# Patient Record
Sex: Male | Born: 1945
Health system: Southern US, Community
[De-identification: ages and names within clinical notes are randomized; demographics above are authoritative.]

## PROBLEM LIST (undated history)

## (undated) DIAGNOSIS — K219 Gastro-esophageal reflux disease without esophagitis: Secondary | ICD-10-CM

## (undated) DIAGNOSIS — M810 Age-related osteoporosis without current pathological fracture: Secondary | ICD-10-CM

## (undated) DIAGNOSIS — C4441 Basal cell carcinoma of skin of scalp and neck: Secondary | ICD-10-CM

## (undated) DIAGNOSIS — J9819 Other pulmonary collapse: Secondary | ICD-10-CM

## (undated) DIAGNOSIS — J45909 Unspecified asthma, uncomplicated: Secondary | ICD-10-CM

## (undated) DIAGNOSIS — Z8489 Family history of other specified conditions: Secondary | ICD-10-CM

## (undated) HISTORY — PX: LEG SURGERY: SHX1003

## (undated) HISTORY — DX: Age-related osteoporosis without current pathological fracture: M81.0

## (undated) HISTORY — DX: Basal cell carcinoma of skin of scalp and neck: C44.41

## (undated) HISTORY — PX: OTHER SURGICAL HISTORY: SHX169

## (undated) HISTORY — PX: HERNIA REPAIR: SHX51

---

## 2005-03-07 HISTORY — PX: FRACTURE SURGERY: SHX138

## 2015-08-06 DIAGNOSIS — C4441 Basal cell carcinoma of skin of scalp and neck: Secondary | ICD-10-CM

## 2015-08-06 HISTORY — DX: Basal cell carcinoma of skin of scalp and neck: C44.41

## 2016-07-04 ENCOUNTER — Ambulatory Visit (INDEPENDENT_AMBULATORY_CARE_PROVIDER_SITE_OTHER): Payer: PPO | Admitting: Family Medicine

## 2016-07-04 VITALS — BP 138/72 | HR 51 | Temp 97.6°F | Ht 66.0 in | Wt 142.0 lb

## 2016-07-04 DIAGNOSIS — Z125 Encounter for screening for malignant neoplasm of prostate: Secondary | ICD-10-CM | POA: Diagnosis not present

## 2016-07-04 DIAGNOSIS — Z0000 Encounter for general adult medical examination without abnormal findings: Secondary | ICD-10-CM

## 2016-07-04 DIAGNOSIS — Z1329 Encounter for screening for other suspected endocrine disorder: Secondary | ICD-10-CM

## 2016-07-04 DIAGNOSIS — N401 Enlarged prostate with lower urinary tract symptoms: Secondary | ICD-10-CM | POA: Diagnosis not present

## 2016-07-04 DIAGNOSIS — N400 Enlarged prostate without lower urinary tract symptoms: Secondary | ICD-10-CM | POA: Insufficient documentation

## 2016-07-04 DIAGNOSIS — Z1159 Encounter for screening for other viral diseases: Secondary | ICD-10-CM | POA: Diagnosis not present

## 2016-07-04 DIAGNOSIS — Z13220 Encounter for screening for lipoid disorders: Secondary | ICD-10-CM | POA: Diagnosis not present

## 2016-07-04 MED ORDER — FINASTERIDE 5 MG PO TABS: 5.0000 mg | ORAL_TABLET | Freq: Every day | ORAL | 4 refills | Status: DC

## 2016-07-04 NOTE — Progress Notes (Signed)
BP 138/72 (BP Location: Left Arm)   Pulse (!) 51   Temp 97.6 F (36.4 C)   Ht 5\' 6"  (1.676 m)   Wt 142 lb (64.4 kg)   SpO2 99%   BMI 22.92 kg/m    Subjective:    Patient ID: Jose [name], male    DOB: 1946/03/02, 71 y.o.   MRN: [id number]  HPI: Jose [name] is a 71 y.o. male  Chief Complaint  Patient presents with  . Establish Care    Patient believes he is UTD on vaccines, but unsure of dates. Had them done at [place].   Patient's establish as new patient taking Proscar for BPH otherwise doing well takes multiple vitamin aspirin and stool softeners. Patient's episodic great deal and has skin lesions and multiple and multiple surgeries and has appointment with dermatology later this year at the [place].  Relevant past medical, surgical, family and social history reviewed and updated as indicated. Interim medical history since our last visit reviewed. Allergies and medications reviewed and updated.  Review of Systems  Constitutional: Negative.   HENT: Negative.   Eyes: Negative.   Respiratory: Negative.   Cardiovascular: Negative.   Gastrointestinal: Negative.   Endocrine: Negative.   Genitourinary: Negative.   Musculoskeletal: Negative.   Skin: Negative.   Allergic/Immunologic: Negative.   Neurological: Negative.   Hematological: Negative.   Psychiatric/Behavioral: Negative.     Per HPI unless specifically indicated above     Objective:    BP 138/72 (BP Location: Left Arm)   Pulse (!) 51   Temp 97.6 F (36.4 C)   Ht 5\' 6"  (1.676 m)   Wt 142 lb (64.4 kg)   SpO2 99%   BMI 22.92 kg/m   Wt Readings from Last 3 Encounters:  07/04/16 142 lb (64.4 kg)    Physical Exam  Constitutional: He is oriented to person, place, and time. He appears well-developed and well-nourished.  HENT:  Head: Normocephalic and atraumatic.  Right Ear: External ear normal.  Left Ear: External ear normal.  Eyes: Conjunctivae and EOM are normal. Pupils are equal, round, and reactive  to light.  Neck: Normal range of motion. Neck supple.  Cardiovascular: Normal rate, regular rhythm, normal heart sounds and intact distal pulses.   Pulmonary/Chest: Effort normal and breath sounds normal.  Abdominal: Soft. Bowel sounds are normal. There is no splenomegaly or hepatomegaly.  Genitourinary: Rectum normal and penis normal.  Genitourinary Comments: BPH changes  Musculoskeletal: Normal range of motion.  Neurological: He is alert and oriented to person, place, and time. He has normal reflexes.  Skin: No rash noted. No erythema.  Psychiatric: He has a normal mood and affect. His behavior is normal. Judgment and thought content normal.    No results found for this or any previous visit.    Assessment & Plan:   Problem List Items Addressed This Visit      Genitourinary   BPH (benign prostatic hyperplasia)    The current medical regimen is effective;  continue present plan and medications.       Relevant Medications   finasteride (PROSCAR) 5 MG tablet   Other Relevant Orders   PSA    Other Visit Diagnoses    PE (physical exam), annual    -  Primary   Relevant Orders   CBC with Differential/Platelet   Comprehensive metabolic panel   Lipid panel   PSA   TSH   Urinalysis, Routine w reflex microscopic   Hepatitis C Antibody  Screening, lipid       Relevant Orders   Lipid panel   Screening PSA (prostate specific antigen)       Relevant Orders   PSA   Screening for thyroid disorder       Relevant Orders   TSH   Need for hepatitis C screening test       Relevant Orders   Hepatitis C Antibody       Follow up plan: Return in about 1 year (around 07/04/2017) for Physical Exam.

## 2016-07-04 NOTE — Assessment & Plan Note (Signed)
The current medical regimen is effective;  continue present plan and medications.  

## 2016-07-05 ENCOUNTER — Encounter: Payer: Self-pay | Admitting: Family Medicine

## 2016-07-05 ENCOUNTER — Telehealth: Payer: Self-pay | Admitting: Family Medicine

## 2016-07-05 DIAGNOSIS — D649 Anemia, unspecified: Secondary | ICD-10-CM

## 2016-07-05 LAB — COMPREHENSIVE METABOLIC PANEL
ALBUMIN: 4.3 g/dL (ref 3.5–4.8)
ALK PHOS: 56 IU/L (ref 39–117)
ALT: 19 IU/L (ref 0–44)
AST: 29 IU/L (ref 0–40)
Albumin/Globulin Ratio: 1.7 (ref 1.2–2.2)
BUN / CREAT RATIO: 26 — AB (ref 10–24)
BUN: 20 mg/dL (ref 8–27)
Bilirubin Total: <0.2 mg/dL (ref 0.0–1.2)
CO2: 26 mmol/L (ref 18–29)
CREATININE: 0.76 mg/dL (ref 0.76–1.27)
Calcium: 9.5 mg/dL (ref 8.6–10.2)
Chloride: 101 mmol/L (ref 96–106)
GFR calc non Af Amer: 92 mL/min/{1.73_m2} (ref >59)
GFR, EST AFRICAN AMERICAN: 106 mL/min/{1.73_m2} (ref >59)
GLOBULIN, TOTAL: 2.6 g/dL (ref 1.5–4.5)
Glucose: 89 mg/dL (ref 65–99)
Potassium: 4.8 mmol/L (ref 3.5–5.2)
SODIUM: 141 mmol/L (ref 134–144)
TOTAL PROTEIN: 6.9 g/dL (ref 6.0–8.5)

## 2016-07-05 LAB — LIPID PANEL
CHOL/HDL RATIO: 2.1 ratio (ref 0.0–5.0)
CHOLESTEROL TOTAL: 144 mg/dL (ref 100–199)
HDL: 67 mg/dL (ref >39)
LDL CALC: 60 mg/dL (ref 0–99)
Triglycerides: 87 mg/dL (ref 0–149)
VLDL Cholesterol Cal: 17 mg/dL (ref 5–40)

## 2016-07-05 LAB — CBC WITH DIFFERENTIAL/PLATELET
Basophils Absolute: 0 10*3/uL (ref 0.0–0.2)
Basos: 0 %
EOS (ABSOLUTE): 0.1 10*3/uL (ref 0.0–0.4)
EOS: 2 %
HEMATOCRIT: 36.8 % — AB (ref 37.5–51.0)
HEMOGLOBIN: 11.8 g/dL — AB (ref 13.0–17.7)
Immature Grans (Abs): 0 10*3/uL (ref 0.0–0.1)
Immature Granulocytes: 0 %
LYMPHS ABS: 1.9 10*3/uL (ref 0.7–3.1)
Lymphs: 34 %
MCH: 29.9 pg (ref 26.6–33.0)
MCHC: 32.1 g/dL (ref 31.5–35.7)
MCV: 93 fL (ref 79–97)
Monocytes Absolute: 0.4 10*3/uL (ref 0.1–0.9)
Monocytes: 6 %
NEUTROS ABS: 3.2 10*3/uL (ref 1.4–7.0)
Neutrophils: 58 %
Platelets: 216 10*3/uL (ref 150–379)
RBC: 3.94 x10E6/uL — ABNORMAL LOW (ref 4.14–5.80)
RDW: 13.6 % (ref 12.3–15.4)
WBC: 5.7 10*3/uL (ref 3.4–10.8)

## 2016-07-05 LAB — URINALYSIS, ROUTINE W REFLEX MICROSCOPIC
Bilirubin, UA: NEGATIVE
Glucose, UA: NEGATIVE
Ketones, UA: NEGATIVE
LEUKOCYTES UA: NEGATIVE
Nitrite, UA: NEGATIVE
Protein, UA: NEGATIVE
RBC, UA: NEGATIVE
SPEC GRAV UA: 1.02 (ref 1.005–1.030)
Urobilinogen, Ur: 0.2 mg/dL (ref 0.2–1.0)
pH, UA: 5 (ref 5.0–7.5)

## 2016-07-05 LAB — TSH: TSH: 1.92 u[IU]/mL (ref 0.450–4.500)

## 2016-07-05 LAB — HEPATITIS C ANTIBODY: Hep C Virus Ab: <0.1 s/co ratio (ref 0.0–0.9)

## 2016-07-05 LAB — MICROSCOPIC EXAMINATION
BACTERIA UA: NONE SEEN
RBC, UA: NONE SEEN /hpf (ref 0–?)
WBC UA: NONE SEEN /HPF (ref 0–?)

## 2016-07-05 LAB — PSA: PROSTATE SPECIFIC AG, SERUM: 1.5 ng/mL (ref 0.0–4.0)

## 2016-07-05 NOTE — Telephone Encounter (Signed)
Pt is returning a call  

## 2016-07-05 NOTE — Telephone Encounter (Signed)
Phone call Discussed with patient borderline anemia patient doesn't eat red meat hardly. Patient with no bleeding identified. We will come back next week for repeat CBC and iron studies.

## 2016-07-05 NOTE — Telephone Encounter (Signed)
Have lab results for patient. Will call pt with afternoon calls.

## 2016-07-11 ENCOUNTER — Other Ambulatory Visit: Payer: PPO

## 2016-07-11 DIAGNOSIS — D649 Anemia, unspecified: Secondary | ICD-10-CM

## 2016-07-12 ENCOUNTER — Telehealth: Payer: Self-pay | Admitting: Family Medicine

## 2016-07-12 DIAGNOSIS — D509 Iron deficiency anemia, unspecified: Secondary | ICD-10-CM

## 2016-07-12 LAB — CBC WITH DIFFERENTIAL/PLATELET
BASOS ABS: 0 10*3/uL (ref 0.0–0.2)
Basos: 1 %
EOS (ABSOLUTE): 0.1 10*3/uL (ref 0.0–0.4)
Eos: 3 %
Hematocrit: 34.4 % — ABNORMAL LOW (ref 37.5–51.0)
Hemoglobin: 11.7 g/dL — ABNORMAL LOW (ref 13.0–17.7)
Immature Grans (Abs): 0 10*3/uL (ref 0.0–0.1)
Immature Granulocytes: 0 %
LYMPHS ABS: 1.4 10*3/uL (ref 0.7–3.1)
Lymphs: 34 %
MCH: 31.1 pg (ref 26.6–33.0)
MCHC: 34 g/dL (ref 31.5–35.7)
MCV: 92 fL (ref 79–97)
MONOS ABS: 0.3 10*3/uL (ref 0.1–0.9)
Monocytes: 8 %
Neutrophils Absolute: 2.2 10*3/uL (ref 1.4–7.0)
Neutrophils: 54 %
Platelets: 217 10*3/uL (ref 150–379)
RBC: 3.76 x10E6/uL — AB (ref 4.14–5.80)
RDW: 13.3 % (ref 12.3–15.4)
WBC: 4.1 10*3/uL (ref 3.4–10.8)

## 2016-07-12 LAB — FERRITIN: Ferritin: 41 ng/mL (ref 30–400)

## 2016-07-12 LAB — RETICULOCYTES: Retic Ct Pct: 0.8 % (ref 0.6–2.6)

## 2016-07-12 LAB — IRON AND TIBC
IRON SATURATION: 38 % (ref 15–55)
Iron: 101 ug/dL (ref 38–169)
TIBC: 267 ug/dL (ref 250–450)
UIBC: 166 ug/dL (ref 111–343)

## 2016-07-12 NOTE — Telephone Encounter (Signed)
Phone call Discussed with patient borderline anemia persisting but not really worse iron studies showing hent towards iron deficiency. Patient taking one baby aspirin daily and has limited protein and red meat in his diet. Discussed Will stop aspirin and start multiple vitamin with iron and GI referral. Patient has been going to the [place] will see about referral to the [facility] gastroenterology if not available will go with local GI sources.

## 2016-07-19 ENCOUNTER — Encounter: Payer: Self-pay | Admitting: Gastroenterology

## 2016-07-20 ENCOUNTER — Telehealth: Payer: Self-pay | Admitting: Family Medicine

## 2016-07-20 NOTE — Telephone Encounter (Signed)
After reviewing chart it appears GI referral was for Jose [name], due to anemia, not for colonoscopy. Relayed that info to patient who stated Dr. Jeananne Rama had already told him what to do so he didn't know why Jose [name] was needed. Jose [name] stated he has added more Iron rich foods to diet and as also started supplements. Jose [name] states that he knows its working because his stools are darker. Jose [name] stated he would wait for Dr. Rance Muir return to discuss.

## 2016-07-20 NOTE — Telephone Encounter (Signed)
Pt would like to speak with PCP about having a colonoscopy.

## 2016-07-25 NOTE — Telephone Encounter (Signed)
Call pt 

## 2016-07-26 NOTE — Telephone Encounter (Signed)
Left message on machine for pt to return call to the office.  

## 2016-07-26 NOTE — Telephone Encounter (Signed)
Phone call Discussed with patient insurance covers screening tests not diagnostic tests will take under advisement.

## 2016-08-03 ENCOUNTER — Telehealth: Payer: Self-pay

## 2016-08-03 ENCOUNTER — Telehealth: Payer: Self-pay | Admitting: Gastroenterology

## 2016-08-03 ENCOUNTER — Other Ambulatory Visit: Payer: Self-pay

## 2016-08-03 DIAGNOSIS — Z1211 Encounter for screening for malignant neoplasm of colon: Secondary | ICD-10-CM

## 2016-08-03 NOTE — Telephone Encounter (Signed)
Gastroenterology Pre-Procedure Review  Request Date: 6/19 Requesting Physician: Dr. Allen Norris  PATIENT REVIEW QUESTIONS: The patient responded to the following health history questions as indicated:    1. Are you having any GI issues? no 2. Do you have a personal history of Polyps? no 3. Do you have a family history of Colon Cancer or Polyps? no 4. Diabetes Mellitus? No 5. Joint replacements in the past 12 months?no 6. Major health problems in the past 3 months?no 7. Any artificial heart valves, MVP, or defibrillator?no    MEDICATIONS & ALLERGIES:    Patient reports the following regarding taking any anticoagulation/antiplatelet therapy:   Plavix, Coumadin, Eliquis, Xarelto, Lovenox, Pradaxa, Brilinta, or Effient? no Aspirin? no  Patient confirms/reports the following medications:  Current Outpatient Prescriptions  Medication Sig Dispense Refill  . aspirin EC 81 MG tablet Take 81 mg by mouth daily.    . finasteride (PROSCAR) 5 MG tablet Take 1 tablet (5 mg total) by mouth daily. 90 tablet 4  . Multiple Vitamin (MULTIVITAMIN) tablet Take 1 tablet by mouth daily.     No current facility-administered medications for this visit.     Patient confirms/reports the following allergies:  No Known Allergies  No orders of the defined types were placed in this encounter.   AUTHORIZATION INFORMATION Primary Insurance: 1D#: Group #:  Secondary Insurance: 1D#: Group #:  SCHEDULE INFORMATION: Date: 6/19 Time: Location: [place]

## 2016-08-03 NOTE — Telephone Encounter (Signed)
08/03/16 Faxed Prior Auth form to [employer] [facility] [phone] / [id number]

## 2016-08-04 ENCOUNTER — Telehealth: Payer: Self-pay | Admitting: Gastroenterology

## 2016-08-04 NOTE — Telephone Encounter (Signed)
08/04/16 Received Auth via Fax Approved Auth #: [id number] for Colonoscopy.

## 2016-08-18 ENCOUNTER — Telehealth: Payer: Self-pay | Admitting: Gastroenterology

## 2016-08-18 NOTE — Telephone Encounter (Signed)
Please call patient regarding some questions. His procedure is next week.

## 2016-08-19 ENCOUNTER — Telehealth: Payer: Self-pay | Admitting: Gastroenterology

## 2016-08-19 NOTE — Telephone Encounter (Signed)
Patient called to cancel his colonoscopy for Tuesday due to a family emergency. He will call back to reschedule. I called [place]

## 2016-08-19 NOTE — Telephone Encounter (Signed)
Called pt back regarding his questions about colonoscopy next week. Pt stated he called and cancelled procedure so he doesn't need to ask his questions.

## 2016-08-23 ENCOUNTER — Ambulatory Visit: Admit: 2016-08-23 | Payer: Self-pay | Admitting: Gastroenterology

## 2016-08-23 SURGERY — COLONOSCOPY WITH PROPOFOL
Anesthesia: General

## 2016-12-19 DIAGNOSIS — R109 Unspecified abdominal pain: Secondary | ICD-10-CM | POA: Diagnosis not present

## 2016-12-19 DIAGNOSIS — M549 Dorsalgia, unspecified: Secondary | ICD-10-CM | POA: Diagnosis not present

## 2017-02-04 DIAGNOSIS — J9819 Other pulmonary collapse: Secondary | ICD-10-CM

## 2017-02-04 HISTORY — DX: Other pulmonary collapse: J98.19

## 2017-02-27 ENCOUNTER — Encounter (HOSPITAL_COMMUNITY): Payer: Self-pay

## 2017-02-27 ENCOUNTER — Emergency Department (HOSPITAL_COMMUNITY): Payer: PPO

## 2017-02-27 ENCOUNTER — Inpatient Hospital Stay (HOSPITAL_COMMUNITY)
Admission: EM | Admit: 2017-02-27 | Discharge: 2017-03-03 | DRG: 184 | Disposition: A | Discharge status: Skilled Nursing Facility | Payer: PPO | Attending: General Surgery | Admitting: General Surgery

## 2017-02-27 DIAGNOSIS — Z79899 Other long term (current) drug therapy: Secondary | ICD-10-CM | POA: Diagnosis not present

## 2017-02-27 DIAGNOSIS — J939 Pneumothorax, unspecified: Secondary | ICD-10-CM

## 2017-02-27 DIAGNOSIS — R52 Pain, unspecified: Secondary | ICD-10-CM | POA: Diagnosis present

## 2017-02-27 DIAGNOSIS — N400 Enlarged prostate without lower urinary tract symptoms: Secondary | ICD-10-CM | POA: Diagnosis present

## 2017-02-27 DIAGNOSIS — M810 Age-related osteoporosis without current pathological fracture: Secondary | ICD-10-CM | POA: Diagnosis not present

## 2017-02-27 DIAGNOSIS — S32591A Other specified fracture of right pubis, initial encounter for closed fracture: Secondary | ICD-10-CM | POA: Diagnosis present

## 2017-02-27 DIAGNOSIS — S2231XD Fracture of one rib, right side, subsequent encounter for fracture with routine healing: Secondary | ICD-10-CM | POA: Diagnosis not present

## 2017-02-27 DIAGNOSIS — L989 Disorder of the skin and subcutaneous tissue, unspecified: Secondary | ICD-10-CM | POA: Diagnosis present

## 2017-02-27 DIAGNOSIS — J9383 Other pneumothorax: Secondary | ICD-10-CM | POA: Diagnosis present

## 2017-02-27 DIAGNOSIS — S40211D Abrasion of right shoulder, subsequent encounter: Secondary | ICD-10-CM | POA: Diagnosis not present

## 2017-02-27 DIAGNOSIS — S42101A Fracture of unspecified part of scapula, right shoulder, initial encounter for closed fracture: Secondary | ICD-10-CM | POA: Diagnosis present

## 2017-02-27 DIAGNOSIS — S40211A Abrasion of right shoulder, initial encounter: Secondary | ICD-10-CM | POA: Diagnosis not present

## 2017-02-27 DIAGNOSIS — S42031A Displaced fracture of lateral end of right clavicle, initial encounter for closed fracture: Secondary | ICD-10-CM | POA: Diagnosis present

## 2017-02-27 DIAGNOSIS — S329XXA Fracture of unspecified parts of lumbosacral spine and pelvis, initial encounter for closed fracture: Secondary | ICD-10-CM | POA: Diagnosis not present

## 2017-02-27 DIAGNOSIS — Z87891 Personal history of nicotine dependence: Secondary | ICD-10-CM

## 2017-02-27 DIAGNOSIS — D62 Acute posthemorrhagic anemia: Secondary | ICD-10-CM | POA: Diagnosis not present

## 2017-02-27 DIAGNOSIS — M25511 Pain in right shoulder: Secondary | ICD-10-CM | POA: Diagnosis not present

## 2017-02-27 DIAGNOSIS — S42001A Fracture of unspecified part of right clavicle, initial encounter for closed fracture: Secondary | ICD-10-CM | POA: Diagnosis not present

## 2017-02-27 DIAGNOSIS — S32401A Unspecified fracture of right acetabulum, initial encounter for closed fracture: Secondary | ICD-10-CM | POA: Diagnosis not present

## 2017-02-27 DIAGNOSIS — M25551 Pain in right hip: Secondary | ICD-10-CM | POA: Diagnosis not present

## 2017-02-27 DIAGNOSIS — R079 Chest pain, unspecified: Secondary | ICD-10-CM | POA: Diagnosis not present

## 2017-02-27 DIAGNOSIS — Z8249 Family history of ischemic heart disease and other diseases of the circulatory system: Secondary | ICD-10-CM | POA: Diagnosis not present

## 2017-02-27 DIAGNOSIS — Z85828 Personal history of other malignant neoplasm of skin: Secondary | ICD-10-CM | POA: Diagnosis not present

## 2017-02-27 DIAGNOSIS — T148XXA Other injury of unspecified body region, initial encounter: Secondary | ICD-10-CM | POA: Diagnosis not present

## 2017-02-27 DIAGNOSIS — Z806 Family history of leukemia: Secondary | ICD-10-CM

## 2017-02-27 DIAGNOSIS — S32501D Unspecified fracture of right pubis, subsequent encounter for fracture with routine healing: Secondary | ICD-10-CM | POA: Diagnosis not present

## 2017-02-27 DIAGNOSIS — S42101D Fracture of unspecified part of scapula, right shoulder, subsequent encounter for fracture with routine healing: Secondary | ICD-10-CM | POA: Diagnosis not present

## 2017-02-27 DIAGNOSIS — S2241XA Multiple fractures of ribs, right side, initial encounter for closed fracture: Principal | ICD-10-CM | POA: Diagnosis present

## 2017-02-27 DIAGNOSIS — S3289XA Fracture of other parts of pelvis, initial encounter for closed fracture: Secondary | ICD-10-CM | POA: Diagnosis not present

## 2017-02-27 DIAGNOSIS — S2231XA Fracture of one rib, right side, initial encounter for closed fracture: Secondary | ICD-10-CM | POA: Diagnosis not present

## 2017-02-27 DIAGNOSIS — S42001D Fracture of unspecified part of right clavicle, subsequent encounter for fracture with routine healing: Secondary | ICD-10-CM | POA: Diagnosis not present

## 2017-02-27 DIAGNOSIS — S42009A Fracture of unspecified part of unspecified clavicle, initial encounter for closed fracture: Secondary | ICD-10-CM | POA: Diagnosis present

## 2017-02-27 DIAGNOSIS — S32601D Unspecified fracture of right ischium, subsequent encounter for fracture with routine healing: Secondary | ICD-10-CM | POA: Diagnosis not present

## 2017-02-27 DIAGNOSIS — S32301A Unspecified fracture of right ilium, initial encounter for closed fracture: Secondary | ICD-10-CM | POA: Diagnosis not present

## 2017-02-27 DIAGNOSIS — S270XXA Traumatic pneumothorax, initial encounter: Secondary | ICD-10-CM | POA: Diagnosis not present

## 2017-02-27 DIAGNOSIS — S32401D Unspecified fracture of right acetabulum, subsequent encounter for fracture with routine healing: Secondary | ICD-10-CM | POA: Diagnosis not present

## 2017-02-27 DIAGNOSIS — J9811 Atelectasis: Secondary | ICD-10-CM | POA: Diagnosis not present

## 2017-02-27 DIAGNOSIS — S270XXD Traumatic pneumothorax, subsequent encounter: Secondary | ICD-10-CM | POA: Diagnosis not present

## 2017-02-27 LAB — PROTIME-INR
INR: 1.14
Prothrombin Time: 14.5 seconds (ref 11.4–15.2)

## 2017-02-27 LAB — CBC WITH DIFFERENTIAL/PLATELET
BASOS ABS: 0 10*3/uL (ref 0.0–0.1)
Basophils Relative: 0 %
Eosinophils Absolute: 0 10*3/uL (ref 0.0–0.7)
Eosinophils Relative: 0 %
HEMATOCRIT: 35.7 % — AB (ref 39.0–52.0)
Hemoglobin: 11.8 g/dL — ABNORMAL LOW (ref 13.0–17.0)
LYMPHS PCT: 8 %
Lymphs Abs: 0.9 10*3/uL (ref 0.7–4.0)
MCH: 30.9 pg (ref 26.0–34.0)
MCHC: 33.1 g/dL (ref 30.0–36.0)
MCV: 93.5 fL (ref 78.0–100.0)
MONO ABS: 0.6 10*3/uL (ref 0.1–1.0)
Monocytes Relative: 6 %
NEUTROS ABS: 9.6 10*3/uL — AB (ref 1.7–7.7)
Neutrophils Relative %: 86 %
Platelets: 176 10*3/uL (ref 150–400)
RBC: 3.82 MIL/uL — ABNORMAL LOW (ref 4.22–5.81)
RDW: 13 % (ref 11.5–15.5)
WBC: 11.1 10*3/uL — ABNORMAL HIGH (ref 4.0–10.5)

## 2017-02-27 LAB — BASIC METABOLIC PANEL
ANION GAP: 13 (ref 5–15)
BUN: 19 mg/dL (ref 6–20)
CALCIUM: 8.7 mg/dL — AB (ref 8.9–10.3)
CO2: 21 mmol/L — AB (ref 22–32)
Chloride: 104 mmol/L (ref 101–111)
Creatinine, Ser: 0.83 mg/dL (ref 0.61–1.24)
GFR calc Af Amer: >60 mL/min (ref >60)
GFR calc non Af Amer: >60 mL/min (ref >60)
GLUCOSE: 130 mg/dL — AB (ref 65–99)
POTASSIUM: 4.1 mmol/L (ref 3.5–5.1)
Sodium: 138 mmol/L (ref 135–145)

## 2017-02-27 MED ORDER — MORPHINE SULFATE (PF) 4 MG/ML IV SOLN
2.0000 mg | INTRAVENOUS | Status: DC | PRN
  Administered 2017-02-27: 4 mg via INTRAVENOUS
  Administered 2017-02-27: 2 mg via INTRAVENOUS
  Administered 2017-03-01 – 2017-03-02 (×3): 4 mg via INTRAVENOUS
  Filled 2017-02-27 (×5): qty 1

## 2017-02-27 MED ORDER — DOCUSATE SODIUM 100 MG PO CAPS
100.0000 mg | ORAL_CAPSULE | Freq: Two times a day (BID) | ORAL | Status: DC
  Administered 2017-02-28 – 2017-03-03 (×7): 100 mg via ORAL
  Filled 2017-02-27 (×7): qty 1

## 2017-02-27 MED ORDER — OXYCODONE HCL 5 MG PO TABS
5.0000 mg | ORAL_TABLET | Freq: Four times a day (QID) | ORAL | Status: DC | PRN
  Administered 2017-02-28 – 2017-03-01 (×4): 5 mg via ORAL
  Filled 2017-02-27 (×4): qty 1

## 2017-02-27 MED ORDER — ACETAMINOPHEN 325 MG PO TABS: 650.0000 mg | ORAL_TABLET | ORAL | Status: DC | PRN

## 2017-02-27 MED ORDER — PROPOFOL 10 MG/ML IV BOLUS
100.0000 mg | Freq: Once | INTRAVENOUS | Status: AC
  Administered 2017-02-27: 30 mg via INTRAVENOUS
  Filled 2017-02-27: qty 20

## 2017-02-27 MED ORDER — FENTANYL CITRATE (PF) 100 MCG/2ML IJ SOLN
50.0000 ug | Freq: Once | INTRAMUSCULAR | Status: AC
  Administered 2017-02-27: 50 ug via INTRAVENOUS
  Filled 2017-02-27: qty 2

## 2017-02-27 MED ORDER — FENTANYL CITRATE (PF) 100 MCG/2ML IJ SOLN
100.0000 ug | Freq: Once | INTRAMUSCULAR | Status: AC
  Administered 2017-02-27: 100 ug via INTRAVENOUS

## 2017-02-27 MED ORDER — FENTANYL CITRATE (PF) 100 MCG/2ML IJ SOLN
INTRAMUSCULAR | Status: AC
  Filled 2017-02-27: qty 2

## 2017-02-27 MED ORDER — PROPOFOL 10 MG/ML IV BOLUS
INTRAVENOUS | Status: AC | PRN
  Administered 2017-02-27 (×2): 30 mg via INTRAVENOUS

## 2017-02-27 MED ORDER — SODIUM CHLORIDE 0.9 % IV SOLN
INTRAVENOUS | Status: DC
  Administered 2017-02-27 – 2017-03-02 (×3): via INTRAVENOUS

## 2017-02-27 NOTE — ED Notes (Signed)
AC to verify room on 6N

## 2017-02-27 NOTE — ED Notes (Signed)
In preparation for conscious sedation, ETCO2 in place, patient placed on cardiac monitor, pulse ox, and BP cuff. Ambu-bag and airway at bedside.

## 2017-02-27 NOTE — ED Notes (Signed)
Attempted report x2; will call back in 5-10mins
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
## 2017-02-27 NOTE — Progress Notes (Signed)
Orthopedic Tech Progress Note Patient Details:  Jose [name] 04/11/1945 [id number]  Ortho Devices Type of Ortho Device: Sling immobilizer Ortho Device/Splint Location: right Ortho Device/Splint Interventions: Application   Post Interventions Patient Tolerated: Well   Kristopher Oppenheim 02/27/2017, 8:38 PM

## 2017-02-27 NOTE — ED Notes (Signed)
Pt transported to xray 
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
## 2017-02-27 NOTE — ED Notes (Signed)
Trauma MD at bedside.
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
## 2017-02-28 ENCOUNTER — Inpatient Hospital Stay (HOSPITAL_COMMUNITY): Payer: PPO

## 2017-02-28 ENCOUNTER — Other Ambulatory Visit: Payer: Self-pay

## 2017-02-28 MED ORDER — ACETAMINOPHEN 500 MG PO TABS
1000.0000 mg | ORAL_TABLET | Freq: Three times a day (TID) | ORAL | Status: DC
  Administered 2017-02-28 – 2017-03-03 (×8): 1000 mg via ORAL
  Filled 2017-02-28 (×8): qty 2

## 2017-02-28 MED ORDER — ENOXAPARIN SODIUM 40 MG/0.4ML ~~LOC~~ SOLN
40.0000 mg | SUBCUTANEOUS | Status: DC
  Administered 2017-02-28 – 2017-03-03 (×4): 40 mg via SUBCUTANEOUS
  Filled 2017-02-28 (×4): qty 0.4

## 2017-02-28 MED ORDER — METHOCARBAMOL 500 MG PO TABS: 500.0000 mg | ORAL_TABLET | Freq: Three times a day (TID) | ORAL | Status: DC | PRN

## 2017-02-28 MED ORDER — CEPHALEXIN 500 MG PO CAPS
500.0000 mg | ORAL_CAPSULE | Freq: Three times a day (TID) | ORAL | Status: DC
  Administered 2017-02-28 – 2017-03-03 (×10): 500 mg via ORAL
  Filled 2017-02-28 (×10): qty 1

## 2017-02-28 MED ORDER — BACITRACIN ZINC 500 UNIT/GM EX OINT
TOPICAL_OINTMENT | Freq: Every day | CUTANEOUS | Status: DC
  Administered 2017-02-28: 14:00:00 via TOPICAL
  Administered 2017-03-01: 1 via TOPICAL
  Administered 2017-03-02 – 2017-03-03 (×2): via TOPICAL
  Filled 2017-02-28 (×2): qty 28.35
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
## 2017-03-01 ENCOUNTER — Inpatient Hospital Stay (HOSPITAL_COMMUNITY): Payer: PPO

## 2017-03-01 LAB — CBC
HCT: 29.3 % — ABNORMAL LOW (ref 39.0–52.0)
Hemoglobin: 9.7 g/dL — ABNORMAL LOW (ref 13.0–17.0)
MCH: 30.6 pg (ref 26.0–34.0)
MCHC: 33.1 g/dL (ref 30.0–36.0)
MCV: 92.4 fL (ref 78.0–100.0)
PLATELETS: 121 10*3/uL — AB (ref 150–400)
RBC: 3.17 MIL/uL — ABNORMAL LOW (ref 4.22–5.81)
RDW: 12.4 % (ref 11.5–15.5)
WBC: 6.1 10*3/uL (ref 4.0–10.5)

## 2017-03-01 MED ORDER — PANTOPRAZOLE SODIUM 40 MG PO TBEC
40.0000 mg | DELAYED_RELEASE_TABLET | Freq: Every day | ORAL | Status: DC
  Administered 2017-03-01 – 2017-03-03 (×3): 40 mg via ORAL
  Filled 2017-03-01 (×3): qty 1

## 2017-03-01 MED ORDER — METHOCARBAMOL 500 MG PO TABS
500.0000 mg | ORAL_TABLET | Freq: Three times a day (TID) | ORAL | Status: DC
  Administered 2017-03-01 – 2017-03-03 (×7): 500 mg via ORAL
  Filled 2017-03-01 (×7): qty 1

## 2017-03-01 MED ORDER — FINASTERIDE 5 MG PO TABS
5.0000 mg | ORAL_TABLET | Freq: Every day | ORAL | Status: DC
  Administered 2017-03-01 – 2017-03-03 (×3): 5 mg via ORAL
  Filled 2017-03-01 (×3): qty 1

## 2017-03-01 MED ORDER — OXYCODONE HCL 5 MG PO TABS
5.0000 mg | ORAL_TABLET | ORAL | Status: DC | PRN
  Administered 2017-03-01 – 2017-03-03 (×8): 10 mg via ORAL
  Filled 2017-03-01 (×8): qty 2

## 2017-03-01 NOTE — Social Work (Addendum)
CSW has verbal permission to speak with and talk to pt family about care.   SonChristiano Blandon [place]= [phone] Daughter- Vickie Epley [place]= [phone] & [phone] (work) Brother- Bunnie Philips "Mortimer Fries" [place]= [phone] & [phone]) Sister in Ismael Karge- [phone]  CSW aware of SNF consult, will check back in with patient following CIR consult to provide support for SNF placement if that is pt choice.   CSW continuing to follow.   Alexander Mt, [place] Work [phone]

## 2017-03-01 NOTE — Social Work (Signed)
CSW stopped by pt room; pt not in room, unable to complete assessment will return back later.   Alexander Mt, [place] Work [phone]
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
## 2017-03-02 ENCOUNTER — Inpatient Hospital Stay (HOSPITAL_COMMUNITY): Payer: PPO

## 2017-03-02 DIAGNOSIS — S42101A Fracture of unspecified part of scapula, right shoulder, initial encounter for closed fracture: Secondary | ICD-10-CM

## 2017-03-02 DIAGNOSIS — S32301A Unspecified fracture of right ilium, initial encounter for closed fracture: Secondary | ICD-10-CM

## 2017-03-02 DIAGNOSIS — S42001A Fracture of unspecified part of right clavicle, initial encounter for closed fracture: Secondary | ICD-10-CM

## 2017-03-02 DIAGNOSIS — S32401A Unspecified fracture of right acetabulum, initial encounter for closed fracture: Secondary | ICD-10-CM

## 2017-03-02 LAB — CBC
HCT: 29.5 % — ABNORMAL LOW (ref 39.0–52.0)
HEMOGLOBIN: 9.8 g/dL — AB (ref 13.0–17.0)
MCH: 30.6 pg (ref 26.0–34.0)
MCHC: 33.2 g/dL (ref 30.0–36.0)
MCV: 92.2 fL (ref 78.0–100.0)
PLATELETS: 147 10*3/uL — AB (ref 150–400)
RBC: 3.2 MIL/uL — ABNORMAL LOW (ref 4.22–5.81)
RDW: 12.4 % (ref 11.5–15.5)
WBC: 6.5 10*3/uL (ref 4.0–10.5)
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
## 2017-03-02 NOTE — Progress Notes (Signed)
Inpatient Rehabilitation  Met with patient at bedside to discuss team's recommendation for IP Rehab.  Shared booklets, insurance verification letter, and answered questions.  I have initiated insurance authorization with [employer].  Plan to following for timing of medical readiness with improved pain control, insurance authorization, and IP Rehab bed availability.  Call if questions.    Carmelia Roller., CCC/SLP Admission Coordinator  [place]  Cell [phone]
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
## 2017-03-03 DIAGNOSIS — S40211D Abrasion of right shoulder, subsequent encounter: Secondary | ICD-10-CM | POA: Diagnosis not present

## 2017-03-03 DIAGNOSIS — Z85828 Personal history of other malignant neoplasm of skin: Secondary | ICD-10-CM | POA: Diagnosis not present

## 2017-03-03 DIAGNOSIS — S42101D Fracture of unspecified part of scapula, right shoulder, subsequent encounter for fracture with routine healing: Secondary | ICD-10-CM | POA: Diagnosis not present

## 2017-03-03 DIAGNOSIS — S2231XD Fracture of one rib, right side, subsequent encounter for fracture with routine healing: Secondary | ICD-10-CM | POA: Diagnosis not present

## 2017-03-03 DIAGNOSIS — S2231XA Fracture of one rib, right side, initial encounter for closed fracture: Secondary | ICD-10-CM | POA: Diagnosis not present

## 2017-03-03 DIAGNOSIS — S42001A Fracture of unspecified part of right clavicle, initial encounter for closed fracture: Secondary | ICD-10-CM | POA: Diagnosis not present

## 2017-03-03 DIAGNOSIS — S270XXA Traumatic pneumothorax, initial encounter: Secondary | ICD-10-CM | POA: Diagnosis not present

## 2017-03-03 DIAGNOSIS — J939 Pneumothorax, unspecified: Secondary | ICD-10-CM | POA: Diagnosis not present

## 2017-03-03 DIAGNOSIS — S42001D Fracture of unspecified part of right clavicle, subsequent encounter for fracture with routine healing: Secondary | ICD-10-CM | POA: Diagnosis not present

## 2017-03-03 DIAGNOSIS — K5900 Constipation, unspecified: Secondary | ICD-10-CM | POA: Diagnosis not present

## 2017-03-03 DIAGNOSIS — S329XXA Fracture of unspecified parts of lumbosacral spine and pelvis, initial encounter for closed fracture: Secondary | ICD-10-CM | POA: Diagnosis not present

## 2017-03-03 DIAGNOSIS — S32501D Unspecified fracture of right pubis, subsequent encounter for fracture with routine healing: Secondary | ICD-10-CM | POA: Diagnosis not present

## 2017-03-03 DIAGNOSIS — M810 Age-related osteoporosis without current pathological fracture: Secondary | ICD-10-CM | POA: Diagnosis not present

## 2017-03-03 DIAGNOSIS — S32601D Unspecified fracture of right ischium, subsequent encounter for fracture with routine healing: Secondary | ICD-10-CM | POA: Diagnosis not present

## 2017-03-03 DIAGNOSIS — S32401D Unspecified fracture of right acetabulum, subsequent encounter for fracture with routine healing: Secondary | ICD-10-CM | POA: Diagnosis not present

## 2017-03-03 DIAGNOSIS — Z8781 Personal history of (healed) traumatic fracture: Secondary | ICD-10-CM | POA: Diagnosis not present

## 2017-03-03 DIAGNOSIS — S2239XA Fracture of one rib, unspecified side, initial encounter for closed fracture: Secondary | ICD-10-CM | POA: Diagnosis not present

## 2017-03-03 DIAGNOSIS — S270XXD Traumatic pneumothorax, subsequent encounter: Secondary | ICD-10-CM | POA: Diagnosis not present

## 2017-03-03 MED ORDER — ACETAMINOPHEN 500 MG PO TABS: 1000.0000 mg | ORAL_TABLET | Freq: Three times a day (TID) | ORAL | 0 refills | Status: DC

## 2017-03-03 MED ORDER — OXYCODONE HCL 5 MG PO TABS: 5.0000 mg | ORAL_TABLET | ORAL | 0 refills | Status: DC | PRN

## 2017-03-03 MED ORDER — CEPHALEXIN 500 MG PO CAPS: 500.0000 mg | ORAL_CAPSULE | Freq: Three times a day (TID) | ORAL | Status: AC

## 2017-03-03 MED ORDER — MORPHINE SULFATE (PF) 4 MG/ML IV SOLN: 2.0000 mg | INTRAVENOUS | Status: DC | PRN

## 2017-03-03 MED ORDER — BACITRACIN ZINC 500 UNIT/GM EX OINT: TOPICAL_OINTMENT | Freq: Every day | CUTANEOUS | 0 refills | Status: DC

## 2017-03-03 MED ORDER — METHOCARBAMOL 500 MG PO TABS: 500.0000 mg | ORAL_TABLET | Freq: Three times a day (TID) | ORAL | 1 refills | Status: DC

## 2017-03-03 NOTE — Progress Notes (Signed)
Patient will discharge to [facility] Anticipated discharge date: 12/28 Family notified: pt brother Transportation by pt brother  CSW signing off.  Jorge Ny, LCSW Clinical Social Worker [phone]

## 2017-03-03 NOTE — Progress Notes (Signed)
Patient was given discharge instructions and medication changes. Patient verbalized understanding.Patient left unit via wheelchair in stable condition with nursing staff. Report was called to [place] at [facility] prior to discharge and transport.

## 2017-03-03 NOTE — Clinical Social Work Placement (Signed)
   CLINICAL SOCIAL WORK PLACEMENT  NOTE  Date:  03/03/2017  Patient Details  Name: Jose [name] MRN: [id number] Date of Birth: 1946/01/02  Clinical Social Work is seeking post-discharge placement for this patient at the [place] level of care (*CSW will initial, date and re-position this form in  chart as items are completed):  Yes   Patient/family provided with [place] Work Department's list of facilities offering this level of care within the geographic area requested by the patient (or if unable, by the patient's family).  Yes   Patient/family informed of their freedom to choose among providers that offer the needed level of care, that participate in Medicare, Medicaid or managed care program needed by the patient, have an available bed and are willing to accept the patient.  Yes   Patient/family informed of [facility]'s ownership interest in [facility] and [facility], as well as of the fact that they are under no obligation to receive care at these facilities.  PASRR submitted to EDS on 03/03/17     PASRR number received on 03/03/17     Existing PASRR number confirmed on       FL2 transmitted to all facilities in geographic area requested by pt/family on 03/03/17     FL2 transmitted to all facilities within larger geographic area on       Patient informed that his/her managed care company has contracts with or will negotiate with certain facilities, including the following:        Yes   Patient/family informed of bed offers received.  Patient chooses bed at [facility]     Physician recommends and patient chooses bed at      Patient to be transferred to [facility] on 03/03/17.  Patient to be transferred to facility by private vehicle     Patient family notified on 03/03/17 of transfer.  Name of family member notified:  pt brother     PHYSICIAN Please sign FL2     Additional Comment:      _______________________________________________ Jorge Ny, LCSW 03/03/2017, 3:29 PM

## 2017-03-03 NOTE — Discharge Instructions (Signed)

## 2017-03-03 NOTE — Care Management Important Message (Signed)
Important Message  Patient Details  Name: Jose [name] MRN: [id number] Date of Birth: December 18, 1945   Medicare Important Message Given:  Yes    Orbie Pyo 03/03/2017, 1:17 PM

## 2017-03-03 NOTE — NC FL2 (Signed)
[place] LEVEL OF CARE SCREENING TOOL     IDENTIFICATION  Patient Name: Jose [name] Birthdate: 18-Feb-1946 Sex: male Admission Date (Current Location): 02/27/2017  [facility] and [place] Number:  Engineering geologist and Address:  [facility] [place] [address], [place], [place] [address]      Provider Number: [id number]  Attending Physician Name and Address:  Md, Trauma, MD  Relative Name and Phone Number:       Current Level of Care: Hospital Recommended Level of Care: [place] Prior Approval Number:    Date Approved/Denied:   PASRR Number:   [id number]   Discharge Plan: SNF    Current Diagnoses: Patient Active Problem List   Diagnosis Date Noted  . Clavicle fracture 02/27/2017  . BPH (benign prostatic hyperplasia) 07/04/2016  . Basal cell carcinoma (BCC) of scalp 08/06/2015    Orientation RESPIRATION BLADDER Height & Weight     Self, Time, Situation, Place  Normal Continent Weight: 135 lb (61.2 kg) Height:  5\' 7"  (170.2 cm)  BEHAVIORAL SYMPTOMS/MOOD NEUROLOGICAL BOWEL NUTRITION STATUS      Continent Diet(see DC summary)  AMBULATORY STATUS COMMUNICATION OF NEEDS Skin   Extensive Assist Verbally Normal                       Personal Care Assistance Level of Assistance  Bathing, Dressing Bathing Assistance: Maximum assistance   Dressing Assistance: Maximum assistance     Functional Limitations Info             SPECIAL CARE FACTORS FREQUENCY  PT (By licensed PT), OT (By licensed OT)     PT Frequency: 5/wk OT Frequency: 5/wk            Contractures      Additional Factors Info  Code Status, Allergies Code Status Info: FULL Allergies Info: NKA           Current Medications (03/03/2017):  This is the current hospital active medication list Current Facility-Administered Medications  Medication Dose Route Frequency Provider Last Rate Last Dose  . acetaminophen (TYLENOL) tablet  1,000 mg  1,000 mg Oral Q8H Rayburn, Kelly A, PA-C   1,000 mg at 03/03/17 0846  . bacitracin ointment   Topical Daily Rayburn, Kelly A, PA-C      . cephALEXin (KEFLEX) capsule 500 mg  500 mg Oral Q8H Paralee Cancel, MD   500 mg at 03/03/17 8144  . docusate sodium (COLACE) capsule 100 mg  100 mg Oral BID Kinsinger, Arta Bruce, MD   100 mg at 03/03/17 1057  . enoxaparin (LOVENOX) injection 40 mg  40 mg Subcutaneous Q24H Rayburn, Kelly A, PA-C   40 mg at 03/03/17 0846  . finasteride (PROSCAR) tablet 5 mg  5 mg Oral Daily Rayburn, Kelly A, PA-C   5 mg at 03/03/17 1057  . methocarbamol (ROBAXIN) tablet 500 mg  500 mg Oral Q8H Rayburn, Kelly A, PA-C   500 mg at 03/03/17 8185  . morphine 4 MG/ML injection 2 mg  2 mg Intravenous Q3H PRN Rayburn, Kelly A, PA-C      . oxyCODONE (Oxy IR/ROXICODONE) immediate release tablet 5-10 mg  5-10 mg Oral Q4H PRN Rayburn, Kelly A, PA-C   10 mg at 03/03/17 1056  . pantoprazole (PROTONIX) EC tablet 40 mg  40 mg Oral Daily Rayburn, Kelly A, PA-C   40 mg at 03/03/17 1057     Discharge Medications: Please see discharge summary for  a list of discharge medications.  Relevant Imaging Results:  Relevant Lab Results:   Additional Information  SS#: [id number]  Jorge Ny, LCSW
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
## 2017-03-03 NOTE — Progress Notes (Signed)
Insurance has denied admission to inpt rehab due to pain and tolerance issues. I met with pt at bedside and he is aware. He then prefers SNF in the [place] area. I have notified [place], SW and also made RN CM , Almyra Free aware. We will sign off at this time. [phone]
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
## 2017-03-08 DIAGNOSIS — S2239XA Fracture of one rib, unspecified side, initial encounter for closed fracture: Secondary | ICD-10-CM | POA: Diagnosis not present

## 2017-03-08 DIAGNOSIS — K5900 Constipation, unspecified: Secondary | ICD-10-CM | POA: Diagnosis not present

## 2017-03-08 DIAGNOSIS — Z8781 Personal history of (healed) traumatic fracture: Secondary | ICD-10-CM | POA: Diagnosis not present

## 2017-03-13 ENCOUNTER — Ambulatory Visit
Admission: RE | Admit: 2017-03-13 | Discharge: 2017-03-13 | Disposition: A | Discharge status: Home / Self Care | Payer: PPO | Source: Ambulatory Visit | Attending: Physician Assistant | Admitting: Physician Assistant

## 2017-03-13 ENCOUNTER — Other Ambulatory Visit: Payer: Self-pay | Admitting: Physician Assistant

## 2017-03-13 DIAGNOSIS — J939 Pneumothorax, unspecified: Secondary | ICD-10-CM | POA: Diagnosis not present

## 2017-03-13 DIAGNOSIS — S2231XS Fracture of one rib, right side, sequela: Secondary | ICD-10-CM

## 2017-03-14 DIAGNOSIS — S2231XA Fracture of one rib, right side, initial encounter for closed fracture: Secondary | ICD-10-CM | POA: Diagnosis not present

## 2017-03-21 ENCOUNTER — Other Ambulatory Visit: Payer: Self-pay
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
## 2017-03-27 DIAGNOSIS — S42101D Fracture of unspecified part of scapula, right shoulder, subsequent encounter for fracture with routine healing: Secondary | ICD-10-CM | POA: Diagnosis not present

## 2017-03-27 DIAGNOSIS — Z87891 Personal history of nicotine dependence: Secondary | ICD-10-CM | POA: Diagnosis not present

## 2017-03-27 DIAGNOSIS — M810 Age-related osteoporosis without current pathological fracture: Secondary | ICD-10-CM | POA: Diagnosis not present

## 2017-03-27 DIAGNOSIS — S32501D Unspecified fracture of right pubis, subsequent encounter for fracture with routine healing: Secondary | ICD-10-CM | POA: Diagnosis not present

## 2017-03-27 DIAGNOSIS — S2231XD Fracture of one rib, right side, subsequent encounter for fracture with routine healing: Secondary | ICD-10-CM | POA: Diagnosis not present

## 2017-03-27 DIAGNOSIS — S32601D Unspecified fracture of right ischium, subsequent encounter for fracture with routine healing: Secondary | ICD-10-CM | POA: Diagnosis not present

## 2017-03-27 DIAGNOSIS — Z9181 History of falling: Secondary | ICD-10-CM | POA: Diagnosis not present

## 2017-03-27 DIAGNOSIS — S42001D Fracture of unspecified part of right clavicle, subsequent encounter for fracture with routine healing: Secondary | ICD-10-CM | POA: Diagnosis not present

## 2017-03-27 DIAGNOSIS — N400 Enlarged prostate without lower urinary tract symptoms: Secondary | ICD-10-CM | POA: Diagnosis not present

## 2017-03-27 DIAGNOSIS — Z85828 Personal history of other malignant neoplasm of skin: Secondary | ICD-10-CM | POA: Diagnosis not present

## 2017-03-27 DIAGNOSIS — S32401D Unspecified fracture of right acetabulum, subsequent encounter for fracture with routine healing: Secondary | ICD-10-CM | POA: Diagnosis not present

## 2017-03-29 DIAGNOSIS — S32591A Other specified fracture of right pubis, initial encounter for closed fracture: Secondary | ICD-10-CM | POA: Diagnosis not present

## 2017-03-29 DIAGNOSIS — S42031A Displaced fracture of lateral end of right clavicle, initial encounter for closed fracture: Secondary | ICD-10-CM | POA: Diagnosis not present

## 2017-04-28 DIAGNOSIS — M25551 Pain in right hip: Secondary | ICD-10-CM | POA: Diagnosis not present

## 2017-04-28 DIAGNOSIS — S32591A Other specified fracture of right pubis, initial encounter for closed fracture: Secondary | ICD-10-CM | POA: Diagnosis not present

## 2017-04-28 DIAGNOSIS — S42001D Fracture of unspecified part of right clavicle, subsequent encounter for fracture with routine healing: Secondary | ICD-10-CM | POA: Diagnosis not present

## 2017-05-01 DIAGNOSIS — S32401D Unspecified fracture of right acetabulum, subsequent encounter for fracture with routine healing: Secondary | ICD-10-CM | POA: Diagnosis not present

## 2017-05-01 DIAGNOSIS — S42101D Fracture of unspecified part of scapula, right shoulder, subsequent encounter for fracture with routine healing: Secondary | ICD-10-CM | POA: Diagnosis not present

## 2017-05-01 DIAGNOSIS — S42001D Fracture of unspecified part of right clavicle, subsequent encounter for fracture with routine healing: Secondary | ICD-10-CM | POA: Diagnosis not present

## 2017-05-01 DIAGNOSIS — S32501D Unspecified fracture of right pubis, subsequent encounter for fracture with routine healing: Secondary | ICD-10-CM | POA: Diagnosis not present

## 2017-06-09 DIAGNOSIS — M25551 Pain in right hip: Secondary | ICD-10-CM | POA: Diagnosis not present

## 2017-06-09 DIAGNOSIS — S42001D Fracture of unspecified part of right clavicle, subsequent encounter for fracture with routine healing: Secondary | ICD-10-CM | POA: Diagnosis not present

## 2017-06-14 DIAGNOSIS — H2513 Age-related nuclear cataract, bilateral: Secondary | ICD-10-CM | POA: Diagnosis not present

## 2017-07-05 ENCOUNTER — Encounter: Payer: Self-pay | Admitting: Family Medicine

## 2017-07-05 ENCOUNTER — Ambulatory Visit (INDEPENDENT_AMBULATORY_CARE_PROVIDER_SITE_OTHER): Payer: PPO | Admitting: Family Medicine

## 2017-07-05 VITALS — BP 133/75 | HR 63 | Ht 67.32 in | Wt 148.0 lb

## 2017-07-05 DIAGNOSIS — Z7189 Other specified counseling: Secondary | ICD-10-CM | POA: Insufficient documentation

## 2017-07-05 DIAGNOSIS — D649 Anemia, unspecified: Secondary | ICD-10-CM | POA: Diagnosis not present

## 2017-07-05 DIAGNOSIS — Z1329 Encounter for screening for other suspected endocrine disorder: Secondary | ICD-10-CM | POA: Diagnosis not present

## 2017-07-05 DIAGNOSIS — S42001A Fracture of unspecified part of right clavicle, initial encounter for closed fracture: Secondary | ICD-10-CM | POA: Diagnosis not present

## 2017-07-05 DIAGNOSIS — N401 Enlarged prostate with lower urinary tract symptoms: Secondary | ICD-10-CM

## 2017-07-05 DIAGNOSIS — Z13220 Encounter for screening for lipoid disorders: Secondary | ICD-10-CM | POA: Diagnosis not present

## 2017-07-05 DIAGNOSIS — D509 Iron deficiency anemia, unspecified: Secondary | ICD-10-CM | POA: Diagnosis not present

## 2017-07-05 DIAGNOSIS — Z0000 Encounter for general adult medical examination without abnormal findings: Secondary | ICD-10-CM | POA: Diagnosis not present

## 2017-07-05 DIAGNOSIS — Z125 Encounter for screening for malignant neoplasm of prostate: Secondary | ICD-10-CM | POA: Diagnosis not present

## 2017-07-05 LAB — URINALYSIS, ROUTINE W REFLEX MICROSCOPIC
BILIRUBIN UA: NEGATIVE
Glucose, UA: NEGATIVE
LEUKOCYTES UA: NEGATIVE
NITRITE UA: NEGATIVE
Protein, UA: NEGATIVE
RBC UA: NEGATIVE
SPEC GRAV UA: 1.02 (ref 1.005–1.030)
UUROB: 0.2 mg/dL (ref 0.2–1.0)
pH, UA: 5 (ref 5.0–7.5)

## 2017-07-05 MED ORDER — FINASTERIDE 5 MG PO TABS: 5.0000 mg | ORAL_TABLET | Freq: Every day | ORAL | 4 refills | Status: DC

## 2017-07-05 NOTE — Assessment & Plan Note (Signed)
The current medical regimen is effective;  continue present plan and medications.  

## 2017-07-05 NOTE — Assessment & Plan Note (Signed)
A voluntary discussion about advance care planning including the explanation and discussion of advance directives was extensively discussed  with the patient.  Explanation about the health care proxy and Living will was reviewed and packet with forms with explanation of how to fill them out was given.   Time spent:  Encounter 16+ min      Individuals present: Pt  
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
## 2017-07-05 NOTE — Assessment & Plan Note (Signed)
Recovering still some neuropathy changes

## 2017-07-06 ENCOUNTER — Encounter: Payer: Self-pay | Admitting: Family Medicine

## 2017-07-06 LAB — CBC WITH DIFFERENTIAL/PLATELET
Basophils Absolute: 0 10*3/uL (ref 0.0–0.2)
Basos: 0 %
EOS (ABSOLUTE): 0.3 10*3/uL (ref 0.0–0.4)
EOS: 4 %
HEMATOCRIT: 35.3 % — AB (ref 37.5–51.0)
Hemoglobin: 11.9 g/dL — ABNORMAL LOW (ref 13.0–17.7)
IMMATURE GRANULOCYTES: 0 %
Immature Grans (Abs): 0 10*3/uL (ref 0.0–0.1)
LYMPHS ABS: 1.7 10*3/uL (ref 0.7–3.1)
Lymphs: 25 %
MCH: 30.2 pg (ref 26.6–33.0)
MCHC: 33.7 g/dL (ref 31.5–35.7)
MCV: 90 fL (ref 79–97)
MONOS ABS: 0.5 10*3/uL (ref 0.1–0.9)
Monocytes: 7 %
NEUTROS PCT: 64 %
Neutrophils Absolute: 4.4 10*3/uL (ref 1.4–7.0)
PLATELETS: 218 10*3/uL (ref 150–379)
RBC: 3.94 x10E6/uL — AB (ref 4.14–5.80)
RDW: 13.9 % (ref 12.3–15.4)
WBC: 6.8 10*3/uL (ref 3.4–10.8)

## 2017-07-06 LAB — COMPREHENSIVE METABOLIC PANEL
A/G RATIO: 1.8 (ref 1.2–2.2)
ALK PHOS: 81 IU/L (ref 39–117)
ALT: 18 IU/L (ref 0–44)
AST: 29 IU/L (ref 0–40)
Albumin: 4.1 g/dL (ref 3.5–4.8)
BUN/Creatinine Ratio: 23 (ref 10–24)
BUN: 18 mg/dL (ref 8–27)
Bilirubin Total: 0.4 mg/dL (ref 0.0–1.2)
CALCIUM: 9.2 mg/dL (ref 8.6–10.2)
CO2: 24 mmol/L (ref 20–29)
Chloride: 101 mmol/L (ref 96–106)
Creatinine, Ser: 0.8 mg/dL (ref 0.76–1.27)
GFR calc Af Amer: 103 mL/min/{1.73_m2} (ref >59)
GFR, EST NON AFRICAN AMERICAN: 89 mL/min/{1.73_m2} (ref >59)
GLOBULIN, TOTAL: 2.3 g/dL (ref 1.5–4.5)
Glucose: 94 mg/dL (ref 65–99)
POTASSIUM: 4.3 mmol/L (ref 3.5–5.2)
SODIUM: 140 mmol/L (ref 134–144)
Total Protein: 6.4 g/dL (ref 6.0–8.5)

## 2017-07-06 LAB — TSH: TSH: 1.65 u[IU]/mL (ref 0.450–4.500)

## 2017-07-06 LAB — LIPID PANEL
Chol/HDL Ratio: 2.2 ratio (ref 0.0–5.0)
Cholesterol, Total: 138 mg/dL (ref 100–199)
HDL: 63 mg/dL (ref >39)
LDL Calculated: 60 mg/dL (ref 0–99)
TRIGLYCERIDES: 75 mg/dL (ref 0–149)
VLDL Cholesterol Cal: 15 mg/dL (ref 5–40)

## 2017-07-06 LAB — PSA: Prostate Specific Ag, Serum: 1.5 ng/mL (ref 0.0–4.0)

## 2017-07-10 DIAGNOSIS — J0110 Acute frontal sinusitis, unspecified: Secondary | ICD-10-CM | POA: Diagnosis not present

## 2017-12-20 ENCOUNTER — Encounter: Payer: Self-pay | Admitting: Emergency Medicine

## 2017-12-20 ENCOUNTER — Emergency Department: Payer: PPO

## 2017-12-20 ENCOUNTER — Emergency Department
Admission: EM | Admit: 2017-12-20 | Discharge: 2017-12-20 | Disposition: A | Discharge status: Home / Self Care | Payer: PPO | Attending: Emergency Medicine | Admitting: Emergency Medicine

## 2017-12-20 DIAGNOSIS — Z87891 Personal history of nicotine dependence: Secondary | ICD-10-CM | POA: Insufficient documentation

## 2017-12-20 DIAGNOSIS — Y929 Unspecified place or not applicable: Secondary | ICD-10-CM | POA: Insufficient documentation

## 2017-12-20 DIAGNOSIS — M542 Cervicalgia: Secondary | ICD-10-CM | POA: Diagnosis not present

## 2017-12-20 DIAGNOSIS — S92061A Displaced intraarticular fracture of right calcaneus, initial encounter for closed fracture: Secondary | ICD-10-CM | POA: Diagnosis not present

## 2017-12-20 DIAGNOSIS — S199XXA Unspecified injury of neck, initial encounter: Secondary | ICD-10-CM | POA: Diagnosis not present

## 2017-12-20 DIAGNOSIS — S299XXA Unspecified injury of thorax, initial encounter: Secondary | ICD-10-CM | POA: Diagnosis not present

## 2017-12-20 DIAGNOSIS — S99911A Unspecified injury of right ankle, initial encounter: Secondary | ICD-10-CM | POA: Diagnosis present

## 2017-12-20 DIAGNOSIS — S92001A Unspecified fracture of right calcaneus, initial encounter for closed fracture: Secondary | ICD-10-CM | POA: Diagnosis not present

## 2017-12-20 DIAGNOSIS — Y939 Activity, unspecified: Secondary | ICD-10-CM | POA: Insufficient documentation

## 2017-12-20 DIAGNOSIS — Z79899 Other long term (current) drug therapy: Secondary | ICD-10-CM | POA: Diagnosis not present

## 2017-12-20 DIAGNOSIS — Y999 Unspecified external cause status: Secondary | ICD-10-CM | POA: Insufficient documentation

## 2017-12-20 DIAGNOSIS — W11XXXA Fall on and from ladder, initial encounter: Secondary | ICD-10-CM | POA: Insufficient documentation

## 2017-12-20 DIAGNOSIS — S3992XA Unspecified injury of lower back, initial encounter: Secondary | ICD-10-CM | POA: Diagnosis not present

## 2017-12-20 MED ORDER — OXYCODONE-ACETAMINOPHEN 5-325 MG PO TABS: 1.0000 | ORAL_TABLET | Freq: Four times a day (QID) | ORAL | 0 refills | Status: AC | PRN

## 2017-12-20 NOTE — Discharge Instructions (Addendum)
Please call Dr. Alvera Singh office in the morning for an appointment in 1-2 days.  Take Percocet as needed for pain.  Please elevate foot tonight.  Do not bear weight on leg.

## 2017-12-20 NOTE — ED Notes (Signed)
ED Provider at bedside. 

## 2017-12-20 NOTE — ED Triage Notes (Signed)
Pt reports climbed a few steps up a 5 foot ladder reaching for something for and fell twisting his right ankle. Pt reports painful to walk on his right ankle now. Pt arrived on crutches, reports they are his from home. Denies hitting head, LOC or other pain. Landed on feet but his right ankle twisted when he landed.

## 2017-12-20 NOTE — ED Provider Notes (Signed)
[facility] Emergency Department Provider Note  ____________________________________________  Time seen: Approximately 2:53 PM  I have reviewed the triage vital signs and the nursing notes.   HISTORY  Chief Complaint Fall and Ankle Pain    HPI Jose [name] is a 72 y.o. male presents to emergency department for evaluation of right foot pain after falling off of a ladder today.  Patient estimates that he was 5 to 6 feet up.  He has not been able to put weight on foot since fall.  He states that ankle turned inward.  He landed on his right side.  He did not hit his head or lose consciousness.  He is not on any blood thinners.  No neck, back pain.  Past Medical History:  Diagnosis Date  . Basal cell carcinoma (BCC) of scalp 08/2015  . Osteoporosis     Patient Active Problem List   Diagnosis Date Noted  . Advanced care planning/counseling discussion 07/05/2017  . Clavicle fracture 02/27/2017  . BPH (benign prostatic hyperplasia) 07/04/2016  . Basal cell carcinoma (BCC) of scalp 08/06/2015    Past Surgical History:  Procedure Laterality Date  . broken collar bone  2002/2007  . FRACTURE SURGERY  2007   broken left thigh bone  . FRACTURE SURGERY  2007   broken left upper arm  . HERNIA REPAIR      Prior to Admission medications   Medication Sig Start Date End Date Taking? Authorizing Provider  acetaminophen (TYLENOL) 500 MG tablet Take 2 tablets (1,000 mg total) by mouth every 8 (eight) hours. Patient not taking: Reported on 07/05/2017 03/03/17   Rayburn, Claiborne Billings A, PA-C  docusate sodium (COLACE) 100 MG capsule Take 200 mg by mouth at bedtime.    [provider]  finasteride (PROSCAR) 5 MG tablet Take 1 tablet (5 mg total) by mouth daily. 07/05/17   Guadalupe Maple, MD  Multiple Vitamin (MULTIVITAMIN) tablet Take 1 tablet by mouth daily.    [provider]    Allergies Patient has no known allergies.  Family History  Problem  Relation Age of Onset  . Congestive Heart Failure Mother   . Cancer Paternal Grandfather        leukemia    Social History Social History   Tobacco Use  . Smoking status: Former Smoker    Years: 4.00    Types: Cigarettes    Last attempt to quit: 02/10/1969    Years since quitting: 48.8  . Smokeless tobacco: Never Used  Substance Use Topics  . Alcohol use: No  . Drug use: No     Review of Systems  Cardiovascular: No chest pain. Respiratory: No SOB. Gastrointestinal: No abdominal pain.  No nausea, no vomiting.  Musculoskeletal: Positive for foot pain. Skin: Negative for rash, abrasions, lacerations, ecchymosis. Neurological: Negative for headaches, numbness or tingling   ____________________________________________   PHYSICAL EXAM:  VITAL SIGNS: ED Triage Vitals  Enc Vitals Group     BP 12/20/17 1346 (!) 156/75     Pulse Rate 12/20/17 1346 61     Resp 12/20/17 1346 16     Temp 12/20/17 1346 98 F (36.7 C)     Temp Source 12/20/17 1346 Oral     SpO2 12/20/17 1346 97 %     Weight 12/20/17 1346 135 lb (61.2 kg)     Height 12/20/17 1346 5\' 7"  (1.702 m)     Head Circumference --      Peak Flow --  Pain Score 12/20/17 1353 5     Pain Loc --      Pain Edu? --      Excl. in [place]? --      Constitutional: Alert and oriented. Well appearing and in no acute distress. Eyes: Conjunctivae are normal. PERRL. EOMI. Head: Atraumatic. ENT:      Ears:      Nose: No congestion/rhinnorhea.      Mouth/Throat: Mucous membranes are moist.  Neck: No stridor. No cervical spine tenderness to palpation. Cardiovascular: Normal rate, regular rhythm.  Good peripheral circulation. Respiratory: Normal respiratory effort without tachypnea or retractions. Lungs CTAB. Good air entry to the bases with no decreased or absent breath sounds. Gastrointestinal: Bowel sounds 4 quadrants. Soft and nontender to palpation. No guarding or rigidity. No palpable masses. No distention.   Musculoskeletal: Full range of motion to all extremities. No gross deformities appreciated.  No tenderness to palpation of T or L-spine.  Ecchymosis and swelling to right heel. Neurologic:  Normal speech and language. No gross focal neurologic deficits are appreciated.  Skin:  Skin is warm, dry and intact. No rash noted. Psychiatric: Mood and affect are normal. Speech and behavior are normal. Patient exhibits appropriate insight and judgement.   ____________________________________________   LABS (all labs ordered are listed, but only abnormal results are displayed)  Labs Reviewed - No data to display ____________________________________________  EKG   ____________________________________________  RADIOLOGY Robinette Haines, personally viewed and evaluated these images (plain radiographs) as part of my medical decision making, as well as reviewing the written report by the radiologist.  Dg Ankle Complete Right  Result Date: 12/20/2017 CLINICAL DATA:  fell twisting his right ankle. Pt reports painful to walk on his right ankle now. Pt arrived on crutches, reports they are his from home. Denies hitting head, LOC or other pain. Landed on feet but his right ankle twisted when he landed. some bruising noted to medial aspect of right ankle. EXAM: RIGHT ANKLE - COMPLETE 3+ VIEW COMPARISON:  None. FINDINGS: Comminuted intra-articular calcaneal fracture, involving body and tuberosity. Lateral soft tissue swelling. Ankle mortise and malleoli intact. Otherwise normal mineralization and alignment. No significant osseous degenerative change. IMPRESSION: Comminuted intra-articular calcaneal fracture. Electronically Signed   By: Lucrezia Europe M.D.   On: 12/20/2017 14:22    ____________________________________________    PROCEDURES  Procedure(s) performed:    Procedures    Medications - No data to display   ____________________________________________   INITIAL IMPRESSION / ASSESSMENT  AND PLAN / ED COURSE  Pertinent labs & imaging results that were available during my care of the patient were reviewed by me and considered in my medical decision making (see chart for details).  Review of the [place] CSRS was performed in accordance of the [place] prior to dispensing any controlled drugs.  ----------------------------------------- 2:50 PM on 12/20/2017 -----------------------------------------  Patient refuses x-rays of C, T, L-spine.     Patient's diagnosis is consistent with calcaneal fracture.  Vital signs and exam are reassuring.  X-ray consistent with calcaneal fracture.  CT was ordered to further evaluate fracture.  Dr. Posey Pronto was consulted, reviewed CT, and recommended consultation with podiatry.  Dr. Vickki Muff was consulted and recommended that patient follow-up with him this week or early next week in clinic for possible surgery next Friday.  Ankle splint was placed.  Crutches were given.  Patient initially refused imaging of the spine.  He denies any back pain.  Patient was then agreeable to x-rays.  PACS is down so  Dr. Jacqualine Code reviewed images and read images negative for acute bony abnormality.  Patient continues to deny back pain.  Patient is requesting to go home prior to official radiology reading.  Vladimir Crofts NP will call patient if radiology interpretation is different.  Patient will be discharged home with prescriptions for percoct. Patient is to follow up with podiatry as directed. Patient is given ED precautions to return to the ED for any worsening or new symptoms.     ____________________________________________  FINAL CLINICAL IMPRESSION(S) / ED DIAGNOSES  Calcaneal fracture   NEW MEDICATIONS STARTED DURING THIS VISIT:  ED Discharge Orders    None          This chart was dictated using voice recognition software/Dragon. Despite best efforts to proofread, errors can occur which can change the meaning. Any change was purely unintentional.    Laban Emperor, PA-C 12/21/17 1630    Earleen Newport, MD 12/23/17 [phone]

## 2017-12-21 DIAGNOSIS — S92011A Displaced fracture of body of right calcaneus, initial encounter for closed fracture: Secondary | ICD-10-CM | POA: Diagnosis not present

## 2017-12-26 ENCOUNTER — Other Ambulatory Visit: Payer: Self-pay | Admitting: Podiatry

## 2017-12-26 DIAGNOSIS — S92011D Displaced fracture of body of right calcaneus, subsequent encounter for fracture with routine healing: Secondary | ICD-10-CM | POA: Diagnosis not present

## 2017-12-27 ENCOUNTER — Encounter
Admission: RE | Admit: 2017-12-27 | Discharge: 2017-12-27 | Disposition: A | Discharge status: Home / Self Care | Payer: PPO | Source: Ambulatory Visit | Attending: Podiatry | Admitting: Podiatry

## 2017-12-27 ENCOUNTER — Other Ambulatory Visit: Payer: Self-pay

## 2017-12-27 HISTORY — DX: Other pulmonary collapse: J98.19

## 2017-12-27 HISTORY — DX: Family history of other specified conditions: Z84.89

## 2017-12-27 HISTORY — DX: Unspecified asthma, uncomplicated: J45.909

## 2017-12-27 HISTORY — DX: Gastro-esophageal reflux disease without esophagitis: K21.9

## 2017-12-27 NOTE — Patient Instructions (Signed)
Your procedure is scheduled on: 12-29-17 FRIDAY Report to Same Day Surgery 2nd floor medical mall [facility] Entrance-take elevator on left to 2nd floor.  Check in with surgery information desk.) To find out your arrival time please call [phone] between 1PM - 3PM on 12-28-17 THURSDAY  Remember: Instructions that are not followed completely may result in serious medical risk, up to and including death, or upon the discretion of your surgeon and anesthesiologist your surgery may need to be rescheduled.    _x___ 1. Do not eat food after midnight the night before your procedure. NO GUM OR CANDY AFTER MIDNIGHT.  You may drink clear liquids up to 2 hours before you are scheduled to arrive at the hospital for your procedure.  Do not drink clear liquids within 2 hours of your scheduled arrival to the hospital.  Clear liquids include  --Water or Apple juice without pulp  --Clear carbohydrate beverage such as ClearFast or Gatorade  --Black Coffee or Clear Tea (No milk, no creamers, do not add anything to the coffee or Tea   ____Ensure clear carbohydrate drink on the way to the hospital for bariatric patients  ____Ensure clear carbohydrate drink 3 hours before surgery for Dr Dwyane Luo patients if physician instructed.     __x__ 2. No Alcohol for 24 hours before or after surgery.   __x__3. No Smoking or e-cigarettes for 24 prior to surgery.  Do not use any chewable tobacco products for at least 6 hour prior to surgery   ____  4. Bring all medications with you on the day of surgery if instructed.    __x__ 5. Notify your doctor if there is any change in your medical condition     (cold, fever, infections).    x___6. On the morning of surgery brush your teeth with toothpaste and water.  You may rinse your mouth with mouth wash if you wish.  Do not swallow any toothpaste or mouthwash.   Do not wear jewelry, make-up, hairpins, clips or nail polish.  Do not wear lotions, powders, or perfumes.  You may wear deodorant.  Do not shave 48 hours prior to surgery. Men may shave face and neck.  Do not bring valuables to the hospital.    [facility] is not responsible for any belongings or valuables.               Contacts, dentures or bridgework may not be worn into surgery.  Leave your suitcase in the car. After surgery it may be brought to your room.  For patients admitted to the hospital, discharge time is determined by your treatment team.  _  Patients discharged the day of surgery will not be allowed to drive home.  You will need someone to drive you home and stay with you the night of your procedure.    Please read over the following fact sheets that you were given:   [facility] Preparing for Surgery   ____ Take anti-hypertensive listed below, cardiac, seizure, asthma, anti-reflux and psychiatric medicines. These include:  1. NONE  2.  3.  4.  5.  6.  ____Fleets enema or Magnesium Citrate as directed.   _x___ Use CHG Soap or sage wipes as directed on instruction sheet   ____ Use inhalers on the day of surgery and bring to hospital day of surgery  ____ Stop Metformin and Janumet 2 days prior to surgery.    ____ Take 1/2 of usual insulin dose the night before surgery and none  on the morning surgery.   ____ Follow recommendations from Cardiologist, Pulmonologist or PCP regarding stopping Aspirin, Coumadin, Plavix ,Eliquis, Effient, or Pradaxa, and Pletal.  X____Stop Anti-inflammatories such as Advil, Aleve, Ibuprofen, Motrin, Naproxen, Naprosyn, Goodies powders or aspirin products NOW- OK to take Tylenol   ____ Stop supplements until after surgery.     ____ Bring C-Pap to the hospital.

## 2017-12-28 ENCOUNTER — Encounter
Admission: RE | Admit: 2017-12-28 | Discharge: 2017-12-28 | Disposition: A | Discharge status: Home / Self Care | Payer: PPO | Source: Ambulatory Visit | Attending: Podiatry | Admitting: Podiatry

## 2017-12-28 DIAGNOSIS — Z01810 Encounter for preprocedural cardiovascular examination: Secondary | ICD-10-CM | POA: Insufficient documentation

## 2017-12-28 DIAGNOSIS — Z01818 Encounter for other preprocedural examination: Secondary | ICD-10-CM | POA: Diagnosis not present

## 2017-12-28 MED ORDER — CEFAZOLIN SODIUM-DEXTROSE 2-4 GM/100ML-% IV SOLN
2.0000 g | INTRAVENOUS | Status: AC
  Administered 2017-12-29: 2 g via INTRAVENOUS

## 2017-12-29 ENCOUNTER — Observation Stay
Admission: RE | Admit: 2017-12-29 | Discharge: 2017-12-30 | Disposition: A | Discharge status: Home / Self Care | Payer: PPO | Source: Ambulatory Visit | Attending: Podiatry | Admitting: Podiatry

## 2017-12-29 ENCOUNTER — Ambulatory Visit: Payer: PPO | Admitting: Anesthesiology

## 2017-12-29 ENCOUNTER — Encounter
Admission: RE | Disposition: A | Discharge status: Home / Self Care | Payer: Self-pay | Source: Ambulatory Visit | Attending: Podiatry

## 2017-12-29 ENCOUNTER — Ambulatory Visit: Payer: PPO

## 2017-12-29 ENCOUNTER — Other Ambulatory Visit: Payer: Self-pay

## 2017-12-29 DIAGNOSIS — Z87891 Personal history of nicotine dependence: Secondary | ICD-10-CM | POA: Insufficient documentation

## 2017-12-29 DIAGNOSIS — Z96661 Presence of right artificial ankle joint: Secondary | ICD-10-CM | POA: Diagnosis not present

## 2017-12-29 DIAGNOSIS — S92011A Displaced fracture of body of right calcaneus, initial encounter for closed fracture: Secondary | ICD-10-CM | POA: Diagnosis not present

## 2017-12-29 DIAGNOSIS — G8918 Other acute postprocedural pain: Secondary | ICD-10-CM | POA: Diagnosis not present

## 2017-12-29 DIAGNOSIS — M810 Age-related osteoporosis without current pathological fracture: Secondary | ICD-10-CM | POA: Insufficient documentation

## 2017-12-29 DIAGNOSIS — J45909 Unspecified asthma, uncomplicated: Secondary | ICD-10-CM | POA: Insufficient documentation

## 2017-12-29 DIAGNOSIS — K219 Gastro-esophageal reflux disease without esophagitis: Secondary | ICD-10-CM | POA: Diagnosis not present

## 2017-12-29 DIAGNOSIS — S92001A Unspecified fracture of right calcaneus, initial encounter for closed fracture: Principal | ICD-10-CM | POA: Insufficient documentation

## 2017-12-29 DIAGNOSIS — X58XXXA Exposure to other specified factors, initial encounter: Secondary | ICD-10-CM | POA: Insufficient documentation

## 2017-12-29 DIAGNOSIS — M79671 Pain in right foot: Secondary | ICD-10-CM | POA: Diagnosis not present

## 2017-12-29 DIAGNOSIS — Z419 Encounter for procedure for purposes other than remedying health state, unspecified: Secondary | ICD-10-CM

## 2017-12-29 DIAGNOSIS — S92002A Unspecified fracture of left calcaneus, initial encounter for closed fracture: Secondary | ICD-10-CM | POA: Diagnosis present

## 2017-12-29 HISTORY — PX: ORIF CALCANEOUS FRACTURE: SHX5030

## 2017-12-29 LAB — CBC
HCT: 33.1 % — ABNORMAL LOW (ref 39.0–52.0)
Hemoglobin: 10.6 g/dL — ABNORMAL LOW (ref 13.0–17.0)
MCH: 30.7 pg (ref 26.0–34.0)
MCHC: 32 g/dL (ref 30.0–36.0)
MCV: 95.9 fL (ref 80.0–100.0)
Platelets: 237 10*3/uL (ref 150–400)
RBC: 3.45 MIL/uL — ABNORMAL LOW (ref 4.22–5.81)
RDW: 12.3 % (ref 11.5–15.5)
WBC: 7.6 10*3/uL (ref 4.0–10.5)
nRBC: 0 % (ref 0.0–0.2)

## 2017-12-29 LAB — CREATININE, SERUM
Creatinine, Ser: 0.65 mg/dL (ref 0.61–1.24)
GFR calc Af Amer: >60 mL/min (ref >60)
GFR calc non Af Amer: >60 mL/min (ref >60)

## 2017-12-29 SURGERY — OPEN REDUCTION INTERNAL FIXATION (ORIF) CALCANEOUS FRACTURE
Anesthesia: General | Laterality: Right

## 2017-12-29 MED ORDER — BUPIVACAINE LIPOSOME 1.3 % IJ SUSP
INTRAMUSCULAR | Status: DC | PRN
  Administered 2017-12-29: 5 mL

## 2017-12-29 MED ORDER — MIDAZOLAM HCL 2 MG/2ML IJ SOLN: 1.0000 mg | INTRAMUSCULAR | Status: DC | PRN

## 2017-12-29 MED ORDER — ROCURONIUM BROMIDE 50 MG/5ML IV SOLN
INTRAVENOUS | Status: AC
  Filled 2017-12-29: qty 1

## 2017-12-29 MED ORDER — SUCCINYLCHOLINE CHLORIDE 20 MG/ML IJ SOLN
INTRAMUSCULAR | Status: AC
  Filled 2017-12-29: qty 1

## 2017-12-29 MED ORDER — ROCURONIUM BROMIDE 100 MG/10ML IV SOLN
INTRAVENOUS | Status: DC | PRN
  Administered 2017-12-29: 5 mg via INTRAVENOUS
  Administered 2017-12-29: 10 mg via INTRAVENOUS
  Administered 2017-12-29: 45 mg via INTRAVENOUS
  Administered 2017-12-29: 10 mg via INTRAVENOUS

## 2017-12-29 MED ORDER — SODIUM CHLORIDE 0.9 % IV SOLN
INTRAVENOUS | Status: DC | PRN
  Administered 2017-12-29: 20 ug/min via INTRAVENOUS

## 2017-12-29 MED ORDER — OXYCODONE-ACETAMINOPHEN 5-325 MG PO TABS
1.0000 | ORAL_TABLET | ORAL | Status: DC | PRN
  Administered 2017-12-29 (×2): 1 via ORAL
  Administered 2017-12-30 (×3): 2 via ORAL
  Filled 2017-12-29: qty 2
  Filled 2017-12-29 (×2): qty 1
  Filled 2017-12-29: qty 2
  Filled 2017-12-29: qty 1
  Filled 2017-12-29: qty 2

## 2017-12-29 MED ORDER — FENTANYL CITRATE (PF) 100 MCG/2ML IJ SOLN: 25.0000 ug | INTRAMUSCULAR | Status: DC | PRN

## 2017-12-29 MED ORDER — PROPOFOL 10 MG/ML IV BOLUS
INTRAVENOUS | Status: DC | PRN
  Administered 2017-12-29: 120 mg via INTRAVENOUS

## 2017-12-29 MED ORDER — CEFAZOLIN SODIUM-DEXTROSE 2-4 GM/100ML-% IV SOLN
INTRAVENOUS | Status: AC
  Filled 2017-12-29: qty 100

## 2017-12-29 MED ORDER — FINASTERIDE 5 MG PO TABS
5.0000 mg | ORAL_TABLET | Freq: Every day | ORAL | Status: DC
  Administered 2017-12-29: 5 mg via ORAL
  Filled 2017-12-29: qty 1

## 2017-12-29 MED ORDER — VITAMIN D3 25 MCG (1000 UNIT) PO TABS
5000.0000 [IU] | ORAL_TABLET | Freq: Every day | ORAL | Status: DC
  Administered 2017-12-30: 5000 [IU] via ORAL
  Filled 2017-12-29 (×2): qty 5

## 2017-12-29 MED ORDER — MIDAZOLAM HCL 2 MG/2ML IJ SOLN
2.0000 mg | INTRAMUSCULAR | Status: DC | PRN
  Administered 2017-12-29: 2 mg via INTRAVENOUS

## 2017-12-29 MED ORDER — MORPHINE SULFATE (PF) 4 MG/ML IV SOLN
2.0000 mg | INTRAVENOUS | Status: DC | PRN
  Administered 2017-12-30 (×3): 2 mg via INTRAVENOUS
  Filled 2017-12-29 (×3): qty 1

## 2017-12-29 MED ORDER — BUPIVACAINE-EPINEPHRINE (PF) 0.25% -1:200000 IJ SOLN
INTRAMUSCULAR | Status: AC
  Filled 2017-12-29: qty 30

## 2017-12-29 MED ORDER — FENTANYL CITRATE (PF) 100 MCG/2ML IJ SOLN
25.0000 ug | INTRAMUSCULAR | Status: DC | PRN
  Administered 2017-12-29: 50 ug via INTRAVENOUS

## 2017-12-29 MED ORDER — PHENYLEPHRINE HCL 10 MG/ML IJ SOLN
INTRAMUSCULAR | Status: DC | PRN
  Administered 2017-12-29 (×2): 50 ug via INTRAVENOUS
  Administered 2017-12-29 (×2): 100 ug via INTRAVENOUS
  Administered 2017-12-29 (×2): 50 ug via INTRAVENOUS
  Administered 2017-12-29: 100 ug via INTRAVENOUS

## 2017-12-29 MED ORDER — FENTANYL CITRATE (PF) 100 MCG/2ML IJ SOLN
INTRAMUSCULAR | Status: DC | PRN
  Administered 2017-12-29: 50 ug via INTRAVENOUS
  Administered 2017-12-29: 100 ug via INTRAVENOUS

## 2017-12-29 MED ORDER — PHENOL 1.4 % MT LIQD
1.0000 | OROMUCOSAL | Status: DC | PRN
  Administered 2017-12-30: 1 via OROMUCOSAL
  Filled 2017-12-29: qty 177

## 2017-12-29 MED ORDER — MIDAZOLAM HCL 2 MG/2ML IJ SOLN
INTRAMUSCULAR | Status: AC
  Administered 2017-12-29: 2 mg via INTRAVENOUS
  Filled 2017-12-29: qty 2

## 2017-12-29 MED ORDER — BUPIVACAINE LIPOSOME 1.3 % IJ SUSP
INTRAMUSCULAR | Status: AC
  Filled 2017-12-29: qty 20

## 2017-12-29 MED ORDER — FAMOTIDINE 20 MG PO TABS
20.0000 mg | ORAL_TABLET | Freq: Once | ORAL | Status: AC
  Administered 2017-12-29: 20 mg via ORAL

## 2017-12-29 MED ORDER — FAMOTIDINE 20 MG PO TABS
ORAL_TABLET | ORAL | Status: AC
  Administered 2017-12-29: 20 mg via ORAL
  Filled 2017-12-29: qty 1

## 2017-12-29 MED ORDER — BUPIVACAINE-EPINEPHRINE (PF) 0.25% -1:200000 IJ SOLN
INTRAMUSCULAR | Status: DC | PRN
  Administered 2017-12-29: 15 mL

## 2017-12-29 MED ORDER — LACTATED RINGERS IV SOLN
INTRAVENOUS | Status: DC
  Administered 2017-12-29: 09:00:00 via INTRAVENOUS

## 2017-12-29 MED ORDER — FENTANYL CITRATE (PF) 100 MCG/2ML IJ SOLN
INTRAMUSCULAR | Status: AC
  Filled 2017-12-29: qty 2

## 2017-12-29 MED ORDER — SUCCINYLCHOLINE CHLORIDE 20 MG/ML IJ SOLN
INTRAMUSCULAR | Status: DC | PRN
  Administered 2017-12-29: 100 mg via INTRAVENOUS

## 2017-12-29 MED ORDER — FENTANYL CITRATE (PF) 100 MCG/2ML IJ SOLN
INTRAMUSCULAR | Status: AC
  Administered 2017-12-29: 50 ug via INTRAVENOUS
  Filled 2017-12-29: qty 2

## 2017-12-29 MED ORDER — POVIDONE-IODINE 7.5 % EX SOLN
Freq: Once | CUTANEOUS | Status: DC
  Filled 2017-12-29: qty 118

## 2017-12-29 MED ORDER — SUGAMMADEX SODIUM 200 MG/2ML IV SOLN
INTRAVENOUS | Status: DC | PRN
  Administered 2017-12-29: 125 mg via INTRAVENOUS

## 2017-12-29 MED ORDER — LIDOCAINE HCL (PF) 1 % IJ SOLN
INTRAMUSCULAR | Status: AC
  Filled 2017-12-29: qty 5

## 2017-12-29 MED ORDER — DOCUSATE SODIUM 100 MG PO CAPS
200.0000 mg | ORAL_CAPSULE | Freq: Every day | ORAL | Status: DC
  Administered 2017-12-29: 200 mg via ORAL
  Filled 2017-12-29: qty 2

## 2017-12-29 MED ORDER — KETOROLAC TROMETHAMINE 0.5 % OP SOLN
1.0000 [drp] | Freq: Three times a day (TID) | OPHTHALMIC | Status: DC | PRN
  Administered 2017-12-29: 1 [drp] via OPHTHALMIC
  Filled 2017-12-29: qty 3

## 2017-12-29 MED ORDER — BUPIVACAINE HCL (PF) 0.5 % IJ SOLN
INTRAMUSCULAR | Status: AC
  Filled 2017-12-29: qty 30

## 2017-12-29 MED ORDER — LIDOCAINE HCL (CARDIAC) PF 100 MG/5ML IV SOSY
PREFILLED_SYRINGE | INTRAVENOUS | Status: DC | PRN
  Administered 2017-12-29: 50 mg via INTRAVENOUS

## 2017-12-29 MED ORDER — ONDANSETRON HCL 4 MG/2ML IJ SOLN: 4.0000 mg | Freq: Once | INTRAMUSCULAR | Status: DC | PRN

## 2017-12-29 MED ORDER — ROPIVACAINE HCL 5 MG/ML IJ SOLN
INTRAMUSCULAR | Status: AC
  Filled 2017-12-29: qty 30

## 2017-12-29 MED ORDER — ONDANSETRON HCL 4 MG/2ML IJ SOLN
INTRAMUSCULAR | Status: DC | PRN
  Administered 2017-12-29: 4 mg via INTRAVENOUS

## 2017-12-29 MED ORDER — OXYCODONE-ACETAMINOPHEN 5-325 MG PO TABS: 1.0000 | ORAL_TABLET | Freq: Four times a day (QID) | ORAL | 0 refills | Status: DC | PRN

## 2017-12-29 MED ORDER — ROPIVACAINE HCL 5 MG/ML IJ SOLN
INTRAMUSCULAR | Status: DC | PRN
  Administered 2017-12-29: 30 mL via PERINEURAL

## 2017-12-29 MED ORDER — LIDOCAINE HCL (PF) 2 % IJ SOLN
INTRAMUSCULAR | Status: AC
  Filled 2017-12-29: qty 10

## 2017-12-29 MED ORDER — SUGAMMADEX SODIUM 200 MG/2ML IV SOLN
INTRAVENOUS | Status: AC
  Filled 2017-12-29: qty 2

## 2017-12-29 MED ORDER — ENOXAPARIN SODIUM 40 MG/0.4ML ~~LOC~~ SOLN
40.0000 mg | SUBCUTANEOUS | Status: DC
  Administered 2017-12-30: 40 mg via SUBCUTANEOUS
  Filled 2017-12-29: qty 0.4

## 2017-12-29 MED ORDER — ONDANSETRON HCL 4 MG/2ML IJ SOLN
INTRAMUSCULAR | Status: AC
  Filled 2017-12-29: qty 2

## 2017-12-29 SURGICAL SUPPLY — 50 items
BANDAGE ELASTIC 4 LF NS (GAUZE/BANDAGES/DRESSINGS) ×2 IMPLANT
BIT DRILL 2.5X2.75 QC CALB (BIT) ×2 IMPLANT
BIT DRILL 2.9 CANN QC NONSTRL (BIT) ×2 IMPLANT
BNDG COHESIVE 4X5 TAN STRL (GAUZE/BANDAGES/DRESSINGS) ×2 IMPLANT
BNDG CONFORM 3 STRL LF (GAUZE/BANDAGES/DRESSINGS) ×2 IMPLANT
BNDG ESMARK 4X12 TAN STRL LF (GAUZE/BANDAGES/DRESSINGS) ×2 IMPLANT
BNDG GAUZE 4.5X4.1 6PLY STRL (MISCELLANEOUS) ×2 IMPLANT
CANISTER SUCT 1200ML W/VALVE (MISCELLANEOUS) ×2 IMPLANT
COVER WAND RF STERILE (DRAPES) ×2 IMPLANT
CUFF TOURN SGL QUICK 18 (TOURNIQUET CUFF) ×2 IMPLANT
DRAPE C-ARM XRAY 36X54 (DRAPES) ×2 IMPLANT
DURAPREP 26ML APPLICATOR (WOUND CARE) ×2 IMPLANT
ELECT REM PT RETURN 9FT ADLT (ELECTROSURGICAL) ×2
ELECTRODE REM PT RTRN 9FT ADLT (ELECTROSURGICAL) ×1 IMPLANT
GAUZE PETRO XEROFOAM 1X8 (MISCELLANEOUS) ×2 IMPLANT
GAUZE SPONGE 4X4 12PLY STRL (GAUZE/BANDAGES/DRESSINGS) ×2 IMPLANT
GAUZE STRETCH 2X75IN STRL (MISCELLANEOUS) ×2 IMPLANT
GLOVE BIO SURGEON STRL SZ7.5 (GLOVE) ×4 IMPLANT
GLOVE INDICATOR 8.0 STRL GRN (GLOVE) ×4 IMPLANT
GOWN STRL REUS W/ TWL LRG LVL3 (GOWN DISPOSABLE) ×2 IMPLANT
GOWN STRL REUS W/TWL LRG LVL3 (GOWN DISPOSABLE) ×2
K-WIRE ACE 1.6X6 (WIRE) ×16
KIT TURNOVER KIT A (KITS) ×2 IMPLANT
KWIRE ACE 1.6X6 (WIRE) ×8 IMPLANT
NEEDLE FILTER BLUNT 18X 1/2SAF (NEEDLE) ×1
NEEDLE FILTER BLUNT 18X1 1/2 (NEEDLE) ×1 IMPLANT
NEEDLE HYPO 25X1 1.5 SAFETY (NEEDLE) ×4 IMPLANT
NS IRRIG 500ML POUR BTL (IV SOLUTION) ×2 IMPLANT
PACK EXTREMITY ARMC (MISCELLANEOUS) ×2 IMPLANT
PENCIL ELECTRO HAND CTR (MISCELLANEOUS) ×2 IMPLANT
PIN FIXATION PROVISIONAL (PIN) ×2 IMPLANT
PLATE ACE PERIMETER LRG (Plate) ×2 IMPLANT
PUTTY DBX 5CC (Putty) ×2 IMPLANT
SCREW ACE CAN 4.0 44M (Screw) ×2 IMPLANT
SCREW ACE CAN 4.0 50M (Screw) ×2 IMPLANT
SCREW CORTICAL 3.5MM  28MM (Screw) ×1 IMPLANT
SCREW CORTICAL 3.5MM  30MM (Screw) ×2 IMPLANT
SCREW CORTICAL 3.5MM 24MM (Screw) ×4 IMPLANT
SCREW CORTICAL 3.5MM 28MM (Screw) ×1 IMPLANT
SCREW CORTICAL 3.5MM 30MM (Screw) ×2 IMPLANT
SPLINT CAST 1 STEP 4X30 (MISCELLANEOUS) ×2 IMPLANT
SPLINT FAST PLASTER 5X30 (CAST SUPPLIES) ×1
SPLINT PLASTER CAST FAST 5X30 (CAST SUPPLIES) ×1 IMPLANT
STOCKINETTE M/LG 89821 (MISCELLANEOUS) ×2 IMPLANT
STRIP CLOSURE SKIN 1/4X4 (GAUZE/BANDAGES/DRESSINGS) ×2 IMPLANT
SUT VIC AB 2-0 CT1 27 (SUTURE) ×1
SUT VIC AB 2-0 CT1 TAPERPNT 27 (SUTURE) ×1 IMPLANT
SUT VIC AB 4-0 FS2 27 (SUTURE) ×2 IMPLANT
SUT VICRYL AB 3-0 FS1 BRD 27IN (SUTURE) ×2 IMPLANT
SYR 10ML LL (SYRINGE) ×6 IMPLANT

## 2017-12-29 NOTE — Op Note (Signed)
Operative note   Surgeon:Portia Wisdom Lawyer: Dr. Sharlotte Alamo    Preop diagnosis: Joint depression right calcaneal fracture    Postop diagnosis: Same    Procedure: ORIF right calcaneal fracture    EBL: Minimal    Anesthesia:regional and general.  Patient underwent popliteal block in the preoperative holding area.  I used 5 mL's of Exparel long-acting anesthetic and 15 mL's of 0.25% bupivacaine along the incision site postoperatively.    Hemostasis: Mid calf tourniquet inflated to 200 mmHg for 120 minutes    Specimen: None    Complications: Comminuted calcaneal fracture    Operative indications:Ryot K Morini is an 72 y.o. that presents today for surgical intervention.  The risks/benefits/alternatives/complications have been discussed and consent has been given.    Procedure:  Patient was brought into the OR and placed on the operating table in thelateral position. After anesthesia was obtained the right lower extremity was prepped and draped in usual sterile fashion.  Attention was directed to the lateral aspect of the calcaneus where a full-thickness incision was taken along the lateral portion of the calcaneus beginning superior to the calcaneal tuber ending at the distal portion of the calcaneal tubercle and then progressing towards the calcaneocuboid joint.  Full-thickness incision was performed along the more posterior and plantar arm.  This was taken more superficial along the level of the peroneal tendon region.  Subperiosteal dissection was taken along the lateral aspect of the calcaneus to the subtalar joint.  The peroneal tendons were noted throughout the procedure and retracted throughout the entire time.  At this time a joint depression calcaneal fracture was noted.  The posterior facet was impacted into the body of the calcaneus in a malaligned position.  Severe comminution of the lateral calcaneal wall wall was noted.  Extension into the calcaneocuboid joint was noted.   At the level of the calcaneocuboid joint this was fairly anatomic.  The joint depression calcaneal fracture was noted.  At this time the joint was then disimpacted.  The posterior facet was manually manipulated into a more aligned position.  Multiple 0.062 K wires were used to stabilize the posterior facet of the subtalar joint.  Multiple comminuted pieces within the subtalar joint were removed.  Once good reduction was noted in the AP and lateral view to 4.0 mm cannulated screws were placed from lateral to medial inferior to the subtalar joint.  Good stability was noted.  At this time a lateral calcaneal plate from the [employer] lower extremity set was placed.  Multiple screws were placed.  These were solid screws from lateral to medial.  This was used to help stabilize the lateral wall of the calcaneus.  The wound was flushed with copious amounts of irrigation.  There was noted to be a defect within the body of the calcaneus.  This was filled with 5 cc of bone putty.  The full-thickness flap was then reanastomosed laterally.  2-0 Vicryl for the subcutaneous tissue and cuticular tissue.  A 3-0 nylon for the skin was used.  After the tourniquet was deflated.  Good perfusion of the lateral skin flap was noted.  A local block was placed as stated above.  A well compressive bulky sterile dressing was then placed on the right lower extremity.  Patient was then placed in a posterior splint with the foot in a neutral position.  He will be admitted to the floor for postoperative pain management.  We will follow-up tomorrow morning for likely discharge.  Will need to be completely nonweightbearing.    Patient tolerated the procedure and anesthesia well.  Was transported from the OR to the PACU with all vital signs stable and vascular status intact. To be discharged per routine protocol.  Will follow up in approximately 1 week in the outpatient clinic.

## 2017-12-29 NOTE — Anesthesia Preprocedure Evaluation (Signed)
Anesthesia Evaluation  Patient identified by MRN, date of birth, ID band Patient awake    Reviewed: Allergy & Precautions, H&P , NPO status , Patient's Chart, lab work & pertinent test results, reviewed documented beta blocker date and time   Airway Mallampati: II  TM Distance: >3 FB Neck ROM: full    Dental  (+) Teeth Intact   Pulmonary neg pulmonary ROS, asthma , former smoker,    Pulmonary exam normal        Cardiovascular Exercise Tolerance: Good negative cardio ROS Normal cardiovascular exam Rhythm:regular Rate:Normal     Neuro/Psych negative neurological ROS  negative psych ROS   GI/Hepatic negative GI ROS, Neg liver ROS, GERD  Medicated,  Endo/Other  negative endocrine ROS  Renal/GU negative Renal ROS  negative genitourinary   Musculoskeletal   Abdominal   Peds  Hematology negative hematology ROS (+)   Anesthesia Other Findings Past Medical History: No date: Asthma     Comment:  as a child 08/2015: Basal cell carcinoma (BCC) of scalp 02/2017: Collapsed lung No date: Family history of adverse reaction to anesthesia     Comment:  brother-PT UNSURE BUT WILL FIND OUT REACTION No date: GERD (gastroesophageal reflux disease)     Comment:  occ No date: Osteoporosis Past Surgical History: 2002/2007: broken collar bone 2007: FRACTURE SURGERY     Comment:  broken left thigh bone 2007: FRACTURE SURGERY     Comment:  broken left upper arm No date: HERNIA REPAIR No date: LEG SURGERY   Reproductive/Obstetrics negative OB ROS                             Anesthesia Physical Anesthesia Plan  ASA: II  Anesthesia Plan: General ETT   Post-op Pain Management:    Induction:   PONV Risk Score and Plan: 3  Airway Management Planned:   Additional Equipment:   Intra-op Plan:   Post-operative Plan:   Informed Consent: I have reviewed the patients History and Physical, chart, labs  and discussed the procedure including the risks, benefits and alternatives for the proposed anesthesia with the patient or authorized representative who has indicated his/her understanding and acceptance.   Dental Advisory Given  Plan Discussed with: CRNA  Anesthesia Plan Comments:         Anesthesia Quick Evaluation

## 2017-12-29 NOTE — Anesthesia Post-op Follow-up Note (Signed)
Anesthesia QCDR form completed.        

## 2017-12-29 NOTE — Anesthesia Procedure Notes (Signed)
Anesthesia Regional Block: Popliteal block   Pre-Anesthetic Checklist: ,, timeout performed, Correct Patient, Correct Site, Correct Laterality, Correct Procedure, Correct Position, site marked, Risks and benefits discussed,  Surgical consent,  Pre-op evaluation,  At surgeon's request and post-op pain management  Laterality: Lower and Right  Prep: chloraprep       Needles:  Injection technique: Single-shot  Needle Type: Echogenic Stimulator Needle     Needle Length: 10cm  Needle Gauge: 20     Additional Needles:   Procedures:,,,, ultrasound used (permanent image in chart),,,,   Nerve Stimulator or Paresthesia:  Response: 80 mA,   Additional Responses:   Narrative:  Injection made incrementally with aspirations every 5 mL.  Performed by: Personally  Anesthesiologist: Piscitello, Precious Haws, MD  Additional Notes: Functioning IV was confirmed and monitors were applied.  An echogenic needle was used. Sterile prep,hand hygiene and sterile gloves were used. Minimal sedation used for procedure.   No paresthesia endorsed by patient during the procedure.  Negative aspiration and negative test dose prior to incremental administration of local anesthetic. The patient tolerated the procedure well with no immediate complications.

## 2017-12-29 NOTE — Discharge Instructions (Signed)
[facility] °[facility] ° °POST OPERATIVE INSTRUCTIONS FOR DR. TROXLER AND DR. Aryn Kops °[facility] ° ° °1. Take your medication as prescribed.  Pain medication should be taken only as needed. ° °2. Keep the dressing clean, dry and intact. ° °3. Keep your foot elevated above the heart level for the first 48 hours. ° °4. Walking to the bathroom and brief periods of walking are acceptable, unless we have instructed you to be non-weight bearing. ° °5. Always wear your post-op shoe when walking.  Always use your crutches if you are to be non-weight bearing. ° °6. Do not take a shower. Baths are permissible as long as the foot is kept out of the water.  ° °7. Every hour you are awake:  °- Bend your knee 15 times. °- Flex foot 15 times °- Massage calf 15 times ° °8. Call [facility] ([phone]) if any of the following problems occur: °- You develop a temperature or fever. °- The bandage becomes saturated with blood. °- Medication does not stop your pain. °- Injury of the foot occurs. °- Any symptoms of infection including redness, odor, or red streaks running from wound. °-  ° °

## 2017-12-29 NOTE — Progress Notes (Signed)
Patient underwent open reduction with internal fixation right calcaneal fracture.  Plan to admit to the floor for 23-hour observation for monitoring of postoperative pain management.  Prescription for Percocet and IV morphine has been written.  Patient had a popliteal block with local anesthetic to the area as well.  Will ask physical therapy to evaluate to maintain safety for nonweightbearing.  Plan for discharge to home tomorrow morning.

## 2017-12-29 NOTE — Anesthesia Procedure Notes (Signed)
Procedure Name: Intubation Performed by: Lance Muss, CRNA Pre-anesthesia Checklist: Patient identified, Patient being monitored, Timeout performed, Emergency Drugs available and Suction available Patient Re-evaluated:Patient Re-evaluated prior to induction Oxygen Delivery Method: Circle system utilized Preoxygenation: Pre-oxygenation with 100% oxygen Induction Type: IV induction Ventilation: Mask ventilation without difficulty Laryngoscope Size: Mac and 3 Grade View: Grade I Tube type: Oral Tube size: 7.5 mm Number of attempts: 1 Airway Equipment and Method: Stylet Placement Confirmation: ETT inserted through vocal cords under direct vision,  positive ETCO2 and breath sounds checked- equal and bilateral Secured at: 23 cm Tube secured with: Tape Dental Injury: Teeth and Oropharynx as per pre-operative assessment

## 2017-12-29 NOTE — Transfer of Care (Signed)
Immediate Anesthesia Transfer of Care Note  Patient: Jose [name]  Procedure(s) Performed: OPEN REDUCTION INTERNAL FIXATION (ORIF) CALCANEOUS FRACTURE (Right )  Patient Location: PACU  Anesthesia Type:General  Level of Consciousness: drowsy and responds to stimulation  Airway & Oxygen Therapy: Patient Spontanous Breathing and Patient connected to face mask oxygen  Post-op Assessment: Report given to RN and Post -op Vital signs reviewed and stable  Post vital signs: Reviewed and stable  Last Vitals:  Vitals Value Taken Time  BP 137/73 12/29/2017  1:14 PM  Temp    Pulse 73 12/29/2017  1:14 PM  Resp 7 12/29/2017  1:14 PM  SpO2 100 % 12/29/2017  1:14 PM    Last Pain:  Vitals:   12/29/17 0945  TempSrc:   PainSc: 0-No pain         Complications: No apparent anesthesia complications

## 2017-12-29 NOTE — H&P (Signed)
HISTORY AND PHYSICAL INTERVAL NOTE:  12/29/2017  9:32 AM  Jose [name]  has presented today for surgery, with the diagnosis of Closed displaced fracture of right calcaneus.  The various methods of treatment have been discussed with the patient.  No guarantees were given.  After consideration of risks, benefits and other options for treatment, the patient has consented to surgery.  I have reviewed the patients' chart and labs.     A history and physical examination was performed in my office.  The patient was reexamined.  There have been no changes to this history and physical examination.  Samara Deist A

## 2017-12-30 DIAGNOSIS — S92001A Unspecified fracture of right calcaneus, initial encounter for closed fracture: Secondary | ICD-10-CM | POA: Diagnosis not present

## 2017-12-30 MED ORDER — OXYCODONE HCL 5 MG PO TABS: 5.0000 mg | ORAL_TABLET | ORAL | 0 refills | Status: DC | PRN

## 2017-12-30 NOTE — Progress Notes (Signed)
Physical Therapy Evaluation Patient Details Name: Jose [name] MRN: [id number] DOB: [birth year] Today's Date: 12/30/2017   History of Present Illness  Jose [name]  had  surgery 12/29/17 , with the diagnosis of Closed displaced fracture of right calcaneus.  Clinical Impression  Patient is in hospital and is s/p  right calcaneus surgery. He is modified independent with transfers, bed mobility and uses knee scooter to stand with RLE NWB. He ambulates with NWB RLE 300 feet with modified independence. He has 5/5 strength BUE and LLE and reports that he can ascend and descend 3 steps on his bottom and then rise up using his UE's with NWB RLE. He has no increased pain during therapy and reports no pain during therapy. Patient does not have any HHPT needs at this time. He is being discharged home today.     Follow Up Recommendations Outpatient PT    Equipment Recommendations  (Patient has knee scooter)    Recommendations for Other Services       Precautions / Restrictions Precautions Precautions: Fall Restrictions Weight Bearing Restrictions: Yes RLE Weight Bearing: Non weight bearing      Mobility  Bed Mobility Overal bed mobility: Modified Independent             General bed mobility comments: (MWB)  Transfers Overall transfer level: Modified independent Equipment used: (knee scooter)                Ambulation/Gait Ambulation/Gait assistance: Modified independent (Device/Increase time) Gait Distance (Feet): 300 Feet Assistive device: (knee scooter)          Stairs            Wheelchair Mobility    Modified Rankin (Stroke Patients Only)       Balance Overall balance assessment: Needs assistance Sitting-balance support: Single extremity supported Sitting balance-Leahy Scale: Normal     Standing balance support: Bilateral upper extremity supported Standing balance-Leahy Scale: Fair                               Pertinent  Vitals/Pain Pain Assessment: No/denies pain    Home Living Family/patient expects to be discharged to:: Private residence Living Arrangements: Alone Available Help at Discharge: Family;Friend(s) Type of Home: House Home Access: Stairs to enter   [employer] of Steps: (3) Home Layout: One level Home Equipment: Other (comment) Additional Comments: (knee scooter)    Prior Function Level of Independence: Independent with assistive device(s)               Hand Dominance   Dominant Hand: Right    Extremity/Trunk Assessment   Upper Extremity Assessment Upper Extremity Assessment: Overall WFL for tasks assessed    Lower Extremity Assessment Lower Extremity Assessment: RLE deficits/detail RLE Deficits / Details: (cast below knee)       Communication   Communication: No difficulties  Cognition Arousal/Alertness: Awake/alert Behavior During Therapy: WFL for tasks assessed/performed Overall Cognitive Status: Within Functional Limits for tasks assessed                                        General Comments      Exercises     Assessment/Plan    PT Assessment Patent does not need any further PT services  PT Problem List         PT Treatment Interventions  PT Goals (Current goals can be found in the Care Plan section)  Acute Rehab PT Goals Patient Stated Goal: (to go home) PT Goal Formulation: With patient Time For Goal Achievement: 12/30/17    Frequency     Barriers to discharge        Co-evaluation               AM-PAC PT "6 Clicks" Daily Activity  Outcome Measure Difficulty turning over in bed (including adjusting bedclothes, sheets and blankets)?: A Little Difficulty moving from lying on back to sitting on the side of the bed? : A Little Difficulty sitting down on and standing up from a chair with arms (e.g., wheelchair, bedside commode, etc,.)?: A Little Help needed moving to and from a bed to chair (including  a wheelchair)?: A Little Help needed walking in hospital room?: A Little Help needed climbing 3-5 steps with a railing? : Total 6 Click Score: 16    End of Session Equipment Utilized During Treatment: Gait belt;Other (comment) Activity Tolerance: (fair) Patient left: in bed;with call bell/phone within reach;with bed alarm set        Time: 1045-1105 PT Time Calculation (min) (ACUTE ONLY): 20 min   Charges:   PT Evaluation $PT Eval Low Complexity: 1 Low PT Treatments $Gait Training: 8-22 mins          Alanson Puls, PT DPT 12/30/2017, 12:15 PM

## 2017-12-30 NOTE — Care Management Obs Status (Signed)
[facility] NOTIFICATION   Patient Details  Name: Jose [name] MRN: [id number] Date of Birth: 02/01/1946   Medicare Observation Status Notification Given:  Yes    Latanya Maudlin, RN 12/30/2017, 8:34 AM

## 2017-12-30 NOTE — Discharge Summary (Signed)
Physician Discharge Summary  Patient ID: Jose [name] MRN: [id number] DOB/AGE: [birth year] 72 y.o.  Admit date: 12/29/2017 Discharge date: 12/30/2017  Admission Diagnoses: Calcaneal fracture right foot  Discharge Diagnoses:  Active Problems:   Closed left calcaneal fracture   Discharged Condition: good  Hospital Course: Patient had surgery yesterday on 12/29/2017 for ORIF calcaneal fracture on his right foot.  Admitted for 23-hour observation.  Significant pain in the right foot once his popliteal block wore off.  Otherwise stable.  Consults: None  Significant Diagnostic Studies: None  Treatments: surgery: 12/29/2017 ORIF calcaneal fracture right foot  Discharge Exam: Blood pressure 131/61, pulse 76, temperature 99.4 F (37.4 C), temperature source Oral, resp. rate 12, SpO2 97 %. Some bleeding present on the bandaging.  Splint intact.  Neurovascular status appears intact in the digits.  Disposition: Discharge disposition: 01-Home or Self Care           Signed: Durward Fortes 12/30/2017, 10:00 AM

## 2018-01-04 DIAGNOSIS — S92011D Displaced fracture of body of right calcaneus, subsequent encounter for fracture with routine healing: Secondary | ICD-10-CM | POA: Diagnosis not present

## 2018-01-08 NOTE — Anesthesia Postprocedure Evaluation (Signed)
Anesthesia Post Note  Patient: Jose [name]  Procedure(s) Performed: OPEN REDUCTION INTERNAL FIXATION (ORIF) CALCANEOUS FRACTURE (Right )  Patient location during evaluation: PACU Anesthesia Type: General Level of consciousness: awake and alert Pain management: pain level controlled Vital Signs Assessment: post-procedure vital signs reviewed and stable Respiratory status: spontaneous breathing, nonlabored ventilation, respiratory function stable and patient connected to nasal cannula oxygen Cardiovascular status: blood pressure returned to baseline and stable Postop Assessment: no apparent nausea or vomiting Anesthetic complications: no     Last Vitals:  Vitals:   12/29/17 2317 12/30/17 0746  BP: (!) 116/48 131/61  Pulse: 74 76  Resp: 12   Temp: 37.4 C 37.4 C  SpO2: 100% 97%    Last Pain:  Vitals:   12/30/17 1042  TempSrc:   PainSc: [place] Adams

## 2018-01-16 DIAGNOSIS — S92011D Displaced fracture of body of right calcaneus, subsequent encounter for fracture with routine healing: Secondary | ICD-10-CM | POA: Diagnosis not present

## 2018-01-23 DIAGNOSIS — S92011D Displaced fracture of body of right calcaneus, subsequent encounter for fracture with routine healing: Secondary | ICD-10-CM | POA: Diagnosis not present

## 2018-02-20 DIAGNOSIS — S92011D Displaced fracture of body of right calcaneus, subsequent encounter for fracture with routine healing: Secondary | ICD-10-CM | POA: Diagnosis not present

## 2018-03-05 ENCOUNTER — Encounter: Payer: Self-pay | Admitting: Family Medicine

## 2018-03-13 DIAGNOSIS — S92011D Displaced fracture of body of right calcaneus, subsequent encounter for fracture with routine healing: Secondary | ICD-10-CM | POA: Diagnosis not present

## 2018-04-03 DIAGNOSIS — T8189XA Other complications of procedures, not elsewhere classified, initial encounter: Secondary | ICD-10-CM | POA: Diagnosis not present

## 2018-04-03 DIAGNOSIS — S92011D Displaced fracture of body of right calcaneus, subsequent encounter for fracture with routine healing: Secondary | ICD-10-CM | POA: Diagnosis not present

## 2018-04-04 DIAGNOSIS — M25674 Stiffness of right foot, not elsewhere classified: Secondary | ICD-10-CM | POA: Diagnosis not present

## 2018-04-04 DIAGNOSIS — M79671 Pain in right foot: Secondary | ICD-10-CM | POA: Diagnosis not present

## 2018-04-10 DIAGNOSIS — M25674 Stiffness of right foot, not elsewhere classified: Secondary | ICD-10-CM | POA: Diagnosis not present

## 2018-04-10 DIAGNOSIS — M79671 Pain in right foot: Secondary | ICD-10-CM | POA: Diagnosis not present

## 2018-04-13 DIAGNOSIS — M79671 Pain in right foot: Secondary | ICD-10-CM | POA: Diagnosis not present

## 2018-04-13 DIAGNOSIS — L97512 Non-pressure chronic ulcer of other part of right foot with fat layer exposed: Secondary | ICD-10-CM | POA: Diagnosis not present

## 2018-04-13 DIAGNOSIS — L02619 Cutaneous abscess of unspecified foot: Secondary | ICD-10-CM | POA: Diagnosis not present

## 2018-04-13 DIAGNOSIS — L03119 Cellulitis of unspecified part of limb: Secondary | ICD-10-CM | POA: Diagnosis not present

## 2018-04-13 DIAGNOSIS — M25674 Stiffness of right foot, not elsewhere classified: Secondary | ICD-10-CM | POA: Diagnosis not present

## 2018-04-13 DIAGNOSIS — S92011D Displaced fracture of body of right calcaneus, subsequent encounter for fracture with routine healing: Secondary | ICD-10-CM | POA: Diagnosis not present

## 2018-04-16 DIAGNOSIS — M25674 Stiffness of right foot, not elsewhere classified: Secondary | ICD-10-CM | POA: Diagnosis not present

## 2018-04-16 DIAGNOSIS — M79671 Pain in right foot: Secondary | ICD-10-CM | POA: Diagnosis not present

## 2018-04-19 DIAGNOSIS — L03119 Cellulitis of unspecified part of limb: Secondary | ICD-10-CM | POA: Diagnosis not present

## 2018-04-19 DIAGNOSIS — S92011D Displaced fracture of body of right calcaneus, subsequent encounter for fracture with routine healing: Secondary | ICD-10-CM | POA: Diagnosis not present

## 2018-04-19 DIAGNOSIS — L02619 Cutaneous abscess of unspecified foot: Secondary | ICD-10-CM | POA: Diagnosis not present

## 2018-04-19 DIAGNOSIS — M79671 Pain in right foot: Secondary | ICD-10-CM | POA: Diagnosis not present

## 2018-04-19 DIAGNOSIS — M25674 Stiffness of right foot, not elsewhere classified: Secondary | ICD-10-CM | POA: Diagnosis not present

## 2018-04-23 DIAGNOSIS — M25674 Stiffness of right foot, not elsewhere classified: Secondary | ICD-10-CM | POA: Diagnosis not present

## 2018-04-23 DIAGNOSIS — M79671 Pain in right foot: Secondary | ICD-10-CM | POA: Diagnosis not present

## 2018-04-26 DIAGNOSIS — M79671 Pain in right foot: Secondary | ICD-10-CM | POA: Diagnosis not present

## 2018-04-26 DIAGNOSIS — M25674 Stiffness of right foot, not elsewhere classified: Secondary | ICD-10-CM | POA: Diagnosis not present

## 2018-04-30 ENCOUNTER — Encounter: Payer: PPO | Attending: Physician Assistant | Admitting: Physician Assistant

## 2018-04-30 ENCOUNTER — Other Ambulatory Visit
Admission: RE | Admit: 2018-04-30 | Discharge: 2018-04-30 | Disposition: A | Discharge status: Home / Self Care | Payer: PPO | Source: Ambulatory Visit | Attending: Physician Assistant | Admitting: Physician Assistant

## 2018-04-30 DIAGNOSIS — M868X8 Other osteomyelitis, other site: Secondary | ICD-10-CM | POA: Diagnosis not present

## 2018-04-30 DIAGNOSIS — B999 Unspecified infectious disease: Secondary | ICD-10-CM | POA: Diagnosis not present

## 2018-04-30 DIAGNOSIS — T8189XA Other complications of procedures, not elsewhere classified, initial encounter: Secondary | ICD-10-CM | POA: Diagnosis not present

## 2018-04-30 DIAGNOSIS — Z86718 Personal history of other venous thrombosis and embolism: Secondary | ICD-10-CM | POA: Insufficient documentation

## 2018-04-30 DIAGNOSIS — T8131XA Disruption of external operation (surgical) wound, not elsewhere classified, initial encounter: Secondary | ICD-10-CM | POA: Diagnosis not present

## 2018-04-30 DIAGNOSIS — L97412 Non-pressure chronic ulcer of right heel and midfoot with fat layer exposed: Secondary | ICD-10-CM | POA: Diagnosis not present

## 2018-04-30 DIAGNOSIS — M79671 Pain in right foot: Secondary | ICD-10-CM | POA: Diagnosis not present

## 2018-04-30 DIAGNOSIS — L97416 Non-pressure chronic ulcer of right heel and midfoot with bone involvement without evidence of necrosis: Secondary | ICD-10-CM | POA: Diagnosis not present

## 2018-04-30 DIAGNOSIS — L97414 Non-pressure chronic ulcer of right heel and midfoot with necrosis of bone: Secondary | ICD-10-CM | POA: Diagnosis not present

## 2018-04-30 DIAGNOSIS — M25674 Stiffness of right foot, not elsewhere classified: Secondary | ICD-10-CM | POA: Diagnosis not present

## 2018-05-01 ENCOUNTER — Other Ambulatory Visit: Payer: Self-pay | Admitting: Physician Assistant

## 2018-05-01 DIAGNOSIS — T8131XA Disruption of external operation (surgical) wound, not elsewhere classified, initial encounter: Secondary | ICD-10-CM

## 2018-05-01 NOTE — Progress Notes (Addendum)
Jose [name] ([id number]) Visit Report for 04/30/2018 Chief Complaint Document Details Patient Name: Jose [name], Jose [name]. Date of Service: 04/30/2018 10:15 Jose [name] Medical Record Number: [id number] Patient Account Number: [id number] Date of Birth/Sex: [birth year] (73 y.o. M) Treating RN: Harold Barban Primary Care Provider: Golden Pop Other Clinician: Referring Provider: Golden Pop Treating Provider/Extender: Melburn Hake, HOYT Weeks in Treatment: 0 Information Obtained from: Patient Chief Complaint Right heel surgical ulcer Electronic Signature(s) Signed: 05/01/2018 8:52:26 Jose [name] By: Worthy Keeler PA-C Entered By: Worthy Keeler on 04/30/2018 10:46:14 Blazina, Jose [name] ([id number]) -------------------------------------------------------------------------------- Debridement Details Patient Name: Jose [name], Jose [name]. Date of Service: 04/30/2018 10:15 Jose [name] Medical Record Number: [id number] Patient Account Number: [id number] Date of Birth/Sex: 13-Aug-1945 (73 y.o. M) Treating RN: Harold Barban Primary Care Provider: Golden Pop Other Clinician: Referring Provider: Golden Pop Treating Provider/Extender: Melburn Hake, HOYT Weeks in Treatment: 0 Debridement Performed for Wound #1 Right,Lateral Calcaneus Assessment: Performed By: Physician STONE III, HOYT E., PA-C Debridement Type: Debridement Level of Consciousness (Pre- Awake and Alert procedure): Pre-procedure Verification/Time Yes - 11:00 Out Taken: Start Time: 11:00 Pain Control: Lidocaine Total Area Debrided (L x W): 0.5 (cm) x 0.6 (cm) = 0.3 (cm) Tissue and other material Viable, Non-Viable, Bone, Slough, Subcutaneous, Slough debrided: Level: Skin/Subcutaneous Tissue/Muscle/Bone Debridement Description: Excisional Instrument: Curette Specimen: Tissue Culture Number of Specimens Taken: 1 Bleeding: Minimum Hemostasis Achieved: Pressure End Time: 11:07 Procedural Pain: 0 Post Procedural Pain: 0 Response to  Treatment: Procedure was tolerated well Level of Consciousness Awake and Alert (Post-procedure): Post Debridement Measurements of Total Wound Length: (cm) 0.5 Width: (cm) 0.6 Depth: (cm) 1.2 Volume: (cm) 0.283 Character of Wound/Ulcer Post Debridement: Improved Post Procedure Diagnosis Same as Pre-procedure Electronic Signature(s) Signed: 05/01/2018 8:52:26 Jose [name] By: Worthy Keeler PA-C Signed: 05/01/2018 11:13:25 Jose [name] By: Harold Barban Entered By: Harold Barban on 04/30/2018 11:07:48 [name] ([id number]) -------------------------------------------------------------------------------- HPI Details Patient Name: Jose [name], Jose [name]. Date of Service: 04/30/2018 10:15 Jose [name] Medical Record Number: [id number] Patient Account Number: [id number] Date of Birth/Sex: 1945/09/27 (73 y.o. M) Treating RN: Harold Barban Primary Care Provider: Golden Pop Other Clinician: Referring Provider: Golden Pop Treating Provider/Extender: Melburn Hake, HOYT Weeks in Treatment: 0 History of Present Illness HPI Description: 04/30/18 on evaluation today patient presents for initial evaluation our clinic for an issue that actually began December 19, 2017. He was actually climbing a ladder with the latter gateway causing him to fall he sustained a significant fracture to his right ankle requiring open reduction internal fixation of the ankle. The patient is an avid biker and not only does he ride bikes but he also builds. It has been very difficult for him to be off of his bicycle during the four months he has been attempting to heal since the surgery. Unfortunately the reason he comes to me today is that he still has an area that just will not close along the surgical incision site that he requested a second opinion regarding with wound care from his podiatrist. The patient really has no significant medical history and even for his age appears to be excellent shape in my pinion. No fevers, chills,  nausea, or vomiting noted at this time. Electronic Signature(s) Signed: 05/30/2018 11:36:31 PM By: Worthy Keeler PA-C Previous Signature: 05/01/2018 8:52:26 Jose [name] Version By: Worthy Keeler PA-C Entered By: Worthy Keeler on 05/30/2018 23:35:16 Jose [name], Jose [name] ([id number]) -------------------------------------------------------------------------------- Physical Exam Details Patient Name: Jose [name], Jose [name]. Date of Service: 04/30/2018 10:15 Jose [name] Medical Record Number: [id number] Patient Account Number: [id number] Date of  Birth/Sex: 04-16-1945 (73 y.o. M) Treating RN: Harold Barban Primary Care Provider: Golden Pop Other Clinician: Referring Provider: Golden Pop Treating Provider/Extender: STONE III, HOYT Weeks in Treatment: 0 Constitutional sitting or standing blood pressure is within target range for patient.. pulse regular and within target range for patient.Marland Kitchen respirations regular, non-labored and within target range for patient.Marland Kitchen temperature within target range for patient.. Well- nourished and well-hydrated in no acute distress. Eyes conjunctiva clear no eyelid edema noted. pupils equal round and reactive to light and accommodation. Ears, Nose, Mouth, and Throat no gross abnormality of ear auricles or external auditory canals. normal hearing noted during conversation. mucus membranes moist. Respiratory normal breathing without difficulty. clear to auscultation bilaterally. Cardiovascular regular rate and rhythm with normal S1, S2. no clubbing, cyanosis, significant edema, <3 sec cap refill. Gastrointestinal (GI) soft, non-tender, non-distended, +BS. no ventral hernia noted. Musculoskeletal normal gait and posture. no significant deformity or arthritic changes, no loss or range of motion, no clubbing. Psychiatric this patient is able to make decisions and demonstrates good insight into disease process. Alert and Oriented x 3. pleasant and cooperative. Notes Upon  inspection today patient's wound bed actually appears to show signs of a wound opening it does appear to go down to lower levels of tissue including bone. There also appeared to be a section where there was bone in the anterior portion of the wound that in my pinion almost appears to be only the bone that they have worked itself all but is embedded in the soft tissues were [place] to the skin. Nonetheless I did perform debridement today to clean away this necrotic area and I did remove a piece of bone which was actually set partially for pathology today and the piece was also sent for culture as well. The patient has had wound cultures but not bone cultures. My hope is this is just a piece of necrotic bone left over from surgery and that there's nothing active infection wise going on. Nonetheless he does actually have an appointment with infectious disease on Thursday this week as well. Electronic Signature(s) Signed: 05/01/2018 8:52:26 Jose [name] By: Worthy Keeler PA-C Entered By: Worthy Keeler on 05/01/2018 08:12:59 Jose [name], Jose [name] ([id number]) -------------------------------------------------------------------------------- Physician Orders Details Patient Name: Jose [name], Jose [name]. Date of Service: 04/30/2018 10:15 Jose [name] Medical Record Number: [id number] Patient Account Number: [id number] Date of Birth/Sex: 1945-10-09 (73 y.o. 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 Primary Wound Dressing Wound #1  Right,Lateral Calcaneus o Iodoflex Secondary Dressing Wound #1 Right,Lateral Calcaneus o Drawtex o [place] Dressing Change Frequency Wound #1 Right,Lateral Calcaneus o Change dressing every day. Follow-up Appointments Wound #1 Right,Lateral Calcaneus o Return Appointment in 1 week. Laboratory o Tissue Pathology biopsy report (PATH) - Bone oooo LOINC Code: [id number] oooo Convenience Name: Tiss Path Bx report o Bacteria identified in Wound by Culture (MICRO) - Bone shaving oooo LOINC Code: 2130-8 oooo Convenience Name: Wound culture routine Radiology o Computed Tomography (CT) Scan , Lower extremity with contrast - Right Lateral Calcaneous, to rule out infection. With contrast. ORLANDUS, BOROWSKI ([id number]) Electronic Signature(s) Signed: 05/01/2018 8:52:26 Jose [name] By: Melburn Hake,  Hoyt PA-C Signed: 05/01/2018 11:13:25 Jose [name] By: Harold Barban Entered By: Harold Barban on 04/30/2018 11:18:19 [name] ([id number]) -------------------------------------------------------------------------------- Problem List Details Patient Name: Jose [name], Jose [name]. Date of Service: 04/30/2018 10:15 Jose [name] Medical Record Number: [id number] Patient Account Number: [id number] Date of Birth/Sex: 06/09/1945 (73 y.o. M) Treating RN: Harold Barban Primary Care Provider: Golden Pop Other Clinician: Referring Provider: Golden Pop Treating Provider/Extender: Melburn Hake, HOYT Weeks in Treatment: 0 Active Problems ICD-10 Evaluated Encounter Code Description Active Date Today Diagnosis T81.31XA Disruption of external operation (surgical) wound, not 04/30/2018 No Yes elsewhere classified, initial encounter L97.414 Non-pressure chronic ulcer of right heel and midfoot with 04/30/2018 No Yes necrosis of bone Inactive Problems Resolved Problems Electronic Signature(s) Signed: 05/01/2018 8:52:26 Jose [name] By: Worthy Keeler PA-C Entered By: Worthy Keeler on 04/30/2018 23:11:01 [name] ([id number]) -------------------------------------------------------------------------------- Progress Note Details Patient Name: Jose [name], Jose [name]. Date of Service: 04/30/2018 10:15 Jose [name] Medical Record Number: [id number] Patient Account Number: [id number] Date of Birth/Sex: 10-18-1945 (73 y.o. M) Treating RN: Harold Barban Primary Care Provider: Golden Pop Other Clinician: Referring Provider: Golden Pop Treating Provider/Extender: Melburn Hake, HOYT Weeks in Treatment: 0 Subjective Chief Complaint Information obtained from Patient Right heel surgical ulcer History of Present Illness (HPI) 04/30/18 on evaluation today patient presents for initial evaluation our clinic for an issue that actually began December 19, 2017. He was actually climbing a ladder with the latter gateway causing him to fall he sustained a significant fracture to his right ankle requiring open reduction internal fixation of the ankle. The patient is an avid biker and not only does he ride bikes but he also builds. It has been very difficult for him to be off of his bicycle during the four months he has been attempting to heal since the surgery. Unfortunately the reason he comes to me today is that he still has an area that just will not close along the surgical incision site that he requested a second opinion regarding with wound care from his podiatrist. The patient really has no significant medical history and even for his age appears to be excellent shape in my pinion. No fevers, chills, nausea, or vomiting noted at this time. Wound History Patient presents with 1 open wound that has been present for approximately 4 months. Patient has been treating wound in the following manner: packing strip. Laboratory tests have not been performed in the last month. Patient reportedly has not tested positive for an antibiotic resistant organism. Patient reportedly has not tested positive for osteomyelitis. Patient reportedly  has not had testing performed to evaluate circulation in the legs. Patient History Information obtained from Patient. Allergies No Known Drug Allergies Family History Cancer - Mother, Heart Disease - Mother, Hypertension - Mother, No family history of Diabetes, Hereditary Spherocytosis, Kidney Disease, Lung Disease, Seizures, Stroke, Thyroid Problems, Tuberculosis. Social History Never smoker, Marital Status - Married, Alcohol Use - Never, Drug Use - No History, Caffeine Use - Daily. Medical History Eyes Denies history of Cataracts, Glaucoma, Optic Neuritis Ear/Nose/Mouth/Throat Denies history of Chronic sinus problems/congestion, Middle ear problems Hematologic/Lymphatic Denies history of Anemia, Hemophilia, Human Immunodeficiency Virus, Lymphedema, Sickle Cell Disease Respiratory Denies history of Aspiration, Asthma, Chronic Obstructive Pulmonary Disease (COPD), Pneumothorax, Sleep Apnea, Tuberculosis Jose [name], Jose [name]. ([id number]) Cardiovascular Denies history of Angina, Arrhythmia, Congestive Heart Failure, Coronary Artery Disease, Deep Vein Thrombosis, Hypertension, Hypotension, Myocardial Infarction, Peripheral Arterial Disease, Peripheral Venous Disease, Phlebitis, Vasculitis Gastrointestinal Denies history of Cirrhosis , Colitis, Crohn s, Hepatitis A, Hepatitis B, Hepatitis  C Endocrine Denies history of Type I Diabetes, Type II Diabetes Genitourinary Denies history of End Stage Renal Disease Immunological Denies history of Lupus Erythematosus, Raynaud s, Scleroderma Integumentary (Skin) Denies history of History of Burn, History of pressure wounds Musculoskeletal Denies history of Gout, Rheumatoid Arthritis, Osteoarthritis, Osteomyelitis Neurologic Denies history of Dementia, Neuropathy, Quadriplegia, Paraplegia, Seizure Disorder Oncologic Denies history of Received Chemotherapy, Received Radiation Psychiatric Denies history of Anorexia/bulimia, Confinement  Anxiety Medical And Surgical History Notes Genitourinary BPH Review of Systems (ROS) Constitutional Symptoms (General Health) Denies complaints or symptoms of Fatigue, Fever, Chills, Marked Weight Change. Eyes Denies complaints or symptoms of Dry Eyes, Vision Changes, Glasses / Contacts. Ear/Nose/Mouth/Throat Denies complaints or symptoms of Difficult clearing ears, Sinusitis. Hematologic/Lymphatic Denies complaints or symptoms of Bleeding / Clotting Disorders, Human Immunodeficiency Virus. Respiratory Denies complaints or symptoms of Chronic or frequent coughs, Shortness of Breath. Cardiovascular Denies complaints or symptoms of Chest pain, LE edema. Gastrointestinal Denies complaints or symptoms of Frequent diarrhea, Nausea, Vomiting. Endocrine Denies complaints or symptoms of Hepatitis, Thyroid disease, Polydypsia (Excessive Thirst). Genitourinary Denies complaints or symptoms of Kidney failure/ Dialysis, Incontinence/dribbling. Immunological Denies complaints or symptoms of Hives, Itching. Integumentary (Skin) Complains or has symptoms of Wounds. Denies complaints or symptoms of Bleeding or bruising tendency, Breakdown, Swelling. Musculoskeletal Denies complaints or symptoms of Muscle Pain, Muscle Weakness. Neurologic Denies complaints or symptoms of Numbness/parasthesias, Focal/Weakness. Psychiatric Denies complaints or symptoms of Anxiety, Claustrophobia. Jose [name], Jose [name] ([id number]) Objective Constitutional sitting or standing blood pressure is within target range for patient.. pulse regular and within target range for patient.Marland Kitchen respirations regular, non-labored and within target range for patient.Marland Kitchen temperature within target range for patient.. Well- nourished and well-hydrated in no acute distress. Vitals Time Taken: 10:02 Jose [name], Height: 67 in, Source: Stated, Weight: 148.1 lbs, BMI: 23.2, Temperature: 97.5 F, Pulse: 64 bpm, Respiratory Rate: 16 breaths/min, Blood  Pressure: 120/60 mmHg. Eyes conjunctiva clear no eyelid edema noted. pupils equal round and reactive to light and accommodation. Ears, Nose, Mouth, and Throat no gross abnormality of ear auricles or external auditory canals. normal hearing noted during conversation. mucus membranes moist. Respiratory normal breathing without difficulty. clear to auscultation bilaterally. Cardiovascular regular rate and rhythm with normal S1, S2. no clubbing, cyanosis, significant edema, Gastrointestinal (GI) soft, non-tender, non-distended, +BS. no ventral hernia noted. Musculoskeletal normal gait and posture. no significant deformity or arthritic changes, no loss or range of motion, no clubbing. Psychiatric this patient is able to make decisions and demonstrates good insight into disease process. Alert and Oriented x 3. pleasant and cooperative. General Notes: Upon inspection today patient's wound bed actually appears to show signs of a wound opening it does appear to go down to lower levels of tissue including bone. There also appeared to be a section where there was bone in the anterior portion of the wound that in my pinion almost appears to be only the bone that they have worked itself all but is embedded in the soft tissues were [place] to the skin. Nonetheless I did perform debridement today to clean away this necrotic area and I did remove a piece of bone which was actually set partially for pathology today and the piece was also sent for culture as well. The patient has had wound cultures but not bone cultures. My hope is this is just a piece of necrotic bone left over from surgery and that there's nothing active infection wise going on. Nonetheless he does actually have an appointment with infectious disease on Thursday this week  as well. Integumentary (Hair, Skin) Wound #1 status is Open. Original cause of wound was Surgical Injury. The wound is located on the Right,Lateral Calcaneus. The wound  measures 0.5cm length x 0.6cm width x 1.2cm depth; 0.236cm^2 area and 0.283cm^3 volume. There is Fat Layer (Subcutaneous Tissue) Exposed exposed. There is no tunneling or undermining noted. There is a medium amount of serous drainage noted. The wound margin is flat and intact. There is small (1-33%) pink granulation within the wound bed. There is a large (67-100%) amount of necrotic tissue within the wound bed including Adherent Slough. The periwound skin appearance exhibited: Scarring, Maceration. The periwound skin appearance did not exhibit: Callus, Crepitus, Excoriation, Induration, Rash, Dry/Scaly, Atrophie Blanche, Cyanosis, Ecchymosis, Hemosiderin Staining, Mottled, Pallor, Rubor, Erythema. Periwound temperature was noted as No Abnormality. 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 There was a Excisional Skin/Subcutaneous Tissue/Muscle/Bone Debridement with a total area of 0.3 sq cm performed by STONE III, HOYT E., PA-C. With the following instrument(s): Curette to remove Viable and Non-Viable tissue/material. Material removed includes Bone,Subcutaneous Tissue, and Slough after achieving pain control using Lidocaine. 1 specimen was taken by a Tissue Culture and sent to the lab per facility protocol. A time out was conducted at 11:00, prior to the start of the procedure. A Minimum amount of bleeding was controlled with Pressure. The procedure was tolerated well with a pain level of 0 throughout and a pain level of 0 following the procedure. Post Debridement Measurements: 0.5cm length x 0.6cm width x 1.2cm depth; 0.283cm^3 volume. Character of Wound/Ulcer Post Debridement is  improved. Post procedure Diagnosis Wound #1: Same as Pre-Procedure Plan Wound Cleansing: Wound #1 Right,Lateral Calcaneus: Clean wound with Normal Saline. Primary Wound Dressing: Wound #1 Right,Lateral Calcaneus: Iodoflex Secondary Dressing: Wound #1 Right,Lateral Calcaneus: Drawtex [place] Dressing Change Frequency: Wound #1 Right,Lateral Calcaneus: Change dressing every day. Follow-up Appointments: Wound #1 Right,Lateral Calcaneus: Return Appointment in 1 week. Radiology ordered were: Computed Tomography (CT) Scan , Lower extremity with contrast - Right Lateral Calcaneous, to rule out infection. With contrast. Laboratory ordered were: Tiss Path Bx report - Bone, Wound culture routine - Bone shaving JACEON, HEIBERGER. ([id number]) At this point I'm gonna suggest that we go ahead and initiate treatment with the Iodoform galls which is a continuation of what patient has been doing at this point. He is in agreement with that plan. Subsequently we're gonna see were things stand at follow-up. If anything changes or worsens he will contact the office and let me know. Otherwise my hope is that he will continue to show signs of good improvement with these wound care measures. I'm gonna also attempt to get in touch with Dr. Gwenevere Ghazi office just to let them know what I have done at this point. I've also gone ahead and ordered an updated CT scan to evaluate for me evidence of active infection the patient cannot have an MRI that would be amount of hardware in his ankle/foot I'm not even sure that an MRI would be valuable due to the distortion. We will subsequently see were things stand at follow-up in one week. Please see above for specific wound care orders. We will see patient for re-evaluation in 1 week(s) here in the clinic. If anything worsens or  changes patient will contact our office for additional recommendations. Electronic Signature(s) Signed: 05/30/2018 11:36:31 PM By:  Worthy Keeler PA-C Previous Signature: 05/01/2018 8:52:26 Jose [name] Version By: Worthy Keeler PA-C Entered By: Worthy Keeler on 05/30/2018 23:35:33 BIEBEL, Jose [name] ([id number]) -------------------------------------------------------------------------------- ROS/PFSH Details Patient Name: MICHOEL, KUNIN. Date of Service: 04/30/2018 10:15 Jose [name] Medical Record Number: [id number] Patient Account Number: [id number] Date of Birth/Sex: 21-Dec-1945 (73 y.o. M) Treating RN: Montey Hora Primary Care Provider: Golden Pop Other Clinician: Referring Provider: Golden Pop Treating Provider/Extender: Melburn Hake, HOYT Weeks in Treatment: 0 Information Obtained From Patient Wound History Do you currently have one or more open woundso Yes How many open wounds do you currently haveo 1 Approximately how long have you had your woundso 4 months How have you been treating your wound(s) until nowo packing strip Has your wound(s) ever healed and then re-openedo No Have you had any lab work done in the past montho No Have you tested positive for an antibiotic resistant organism (MRSA, VRE)o No Have you tested positive for osteomyelitis (bone infection)o No Have you had any tests for circulation on your legso No Constitutional Symptoms (General Health) Complaints and Symptoms: Negative for: Fatigue; Fever; Chills; Marked Weight Change Eyes Complaints and Symptoms: Negative for: Dry Eyes; Vision Changes; Glasses / Contacts Medical History: Negative for: Cataracts; Glaucoma; Optic Neuritis Ear/Nose/Mouth/Throat Complaints and Symptoms: Negative for: Difficult clearing ears; Sinusitis Medical History: Negative for: Chronic sinus problems/congestion; Middle ear problems Hematologic/Lymphatic Complaints and Symptoms: Negative for: Bleeding / Clotting Disorders; Human Immunodeficiency Virus Medical History: Negative for: Anemia; Hemophilia; Human Immunodeficiency Virus; Lymphedema; Sickle Cell  Disease Respiratory Complaints and Symptoms: Negative for: Chronic or frequent coughs; Shortness of Breath Medical History: Negative for: Aspiration; Asthma; Chronic Obstructive Pulmonary Disease (COPD); Pneumothorax; Sleep Apnea; ERAGON, HAMMOND. ([id number]) Tuberculosis Cardiovascular Complaints and Symptoms: Negative for: Chest pain; LE edema Medical History: Negative for: Angina; Arrhythmia; Congestive Heart Failure; Coronary Artery Disease; Deep Vein Thrombosis; Hypertension; Hypotension; Myocardial Infarction; Peripheral Arterial Disease; Peripheral Venous Disease; Phlebitis; Vasculitis Gastrointestinal Complaints and Symptoms: Negative for: Frequent diarrhea; Nausea; Vomiting Medical History: Negative for: Cirrhosis ; Colitis; Crohnos; Hepatitis A; Hepatitis B; Hepatitis C Endocrine Complaints and Symptoms: Negative for: Hepatitis; Thyroid disease; Polydypsia (Excessive Thirst) Medical History: Negative for: Type I Diabetes; Type II Diabetes Genitourinary Complaints and Symptoms: Negative for: Kidney failure/ Dialysis; Incontinence/dribbling Medical History: Negative for: End Stage Renal Disease Past Medical History Notes: BPH Immunological Complaints and Symptoms: Negative for: Hives; Itching Medical History: Negative for: Lupus Erythematosus; Raynaudos; Scleroderma Integumentary (Skin) Complaints and Symptoms: Positive for: Wounds Negative for: Bleeding or bruising tendency; Breakdown; Swelling Medical History: Negative for: History of Burn; History of pressure wounds Musculoskeletal Complaints and Symptoms: Negative for: Muscle Pain; Muscle Weakness ZALAN, SHIDLER. ([id number]) Medical History: Negative for: Gout; Rheumatoid Arthritis; Osteoarthritis; Osteomyelitis Neurologic Complaints and Symptoms: Negative for: Numbness/parasthesias; Focal/Weakness Medical History: Negative for: Dementia; Neuropathy; Quadriplegia; Paraplegia; Seizure  Disorder Psychiatric Complaints and Symptoms: Negative for: Anxiety; Claustrophobia Medical History: Negative for: Anorexia/bulimia; Confinement Anxiety Oncologic Medical History: Negative for: Received Chemotherapy; Received Radiation Immunizations Pneumococcal Vaccine: Received Pneumococcal Vaccination: Yes Immunization Notes: up to date Implantable Devices None Family and Social History Cancer: Yes - Mother; Diabetes: No; Heart Disease: Yes - Mother; Hereditary Spherocytosis: No; Hypertension: Yes - Mother; Kidney Disease: No; Lung Disease: No; Seizures: No; Stroke: No; Thyroid Problems: No; Tuberculosis: No; Never smoker; Marital Status - Married; Alcohol Use: Never; Drug Use: No History; Caffeine Use: Daily; Financial Concerns: No; Food, Clothing or  Shelter Needs: No; Support System Lacking: No; Transportation Concerns: No; Advanced Directives: No; Patient does not want information on Advanced Directives Electronic Signature(s) Signed: 04/30/2018 4:43:43 PM By: Montey Hora Signed: 05/01/2018 8:52:26 Jose [name] By: Worthy Keeler PA-C Entered By: Montey Hora on 04/30/2018 10:10:29 DETRELL, UMSCHEID ([id number]) -------------------------------------------------------------------------------- [place] Details Patient Name: ABHIJAY, MORRISS. Date of Service: 04/30/2018 Medical Record Number: [id number] Patient Account Number: [id number] Date of Birth/Sex: 01/19/1946 (73 y.o. 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 Facility Procedures CPT4 Code: 54562563 Description: 99213 - WOUND CARE VISIT-LEV 3 EST  PT Modifier: Quantity: 1 CPT4 Code: 89373428 Description: 11044 - DEB BONE 20 SQ CM/< ICD-10 Diagnosis Description L97.414 Non-pressure chronic ulcer of right heel and midfoot with necro Modifier: sis of bone Quantity: 1 Physician Procedures CPT4: Description Modifier Quantity Code 7681157 26203 - WC PHYS LEVEL 4 - NEW PT 25 1 ICD-10 Diagnosis Description T81.31XA Disruption of external operation (surgical) wound, not elsewhere classified, initial encounter L97.414 Non-pressure chronic ulcer  of right heel and midfoot with necrosis of bone CPT4: 5597416 Debridement; bone (includes epidermis, dermis, subQ tissue, muscle and/or fascia, if 1 performed) 1st 20 sqcm or less ICD-10 Diagnosis Description 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

## 2018-05-01 NOTE — Progress Notes (Signed)
[name], [name] ([id number]) Visit Report for 04/30/2018 Abuse/Suicide Risk Screen Details Patient Name: Jose [name]. Date of Service: 04/30/2018 10:15 AM Medical Record Number: [id number] Patient Account Number: [id number] Date of Birth/Sex: 08-08-1945 (73 y.o. M) Treating RN: Montey Hora Primary Care Ieshia Hatcher: Golden Pop Other Clinician: Referring Carlyle Achenbach: Samara Deist Treating Graysen Woodyard/Extender: STONE III, HOYT Weeks in Treatment: 0 Abuse/Suicide Risk Screen Items Answer ABUSE/SUICIDE RISK SCREEN: Has anyone close to you tried to hurt or harm you recentlyo No Do you feel uncomfortable with anyone in your familyo No Has anyone forced you do things that you didnot want to doo No Do you have any thoughts of harming yourselfo No Patient displays signs or symptoms of abuse and/or neglect. No Electronic Signature(s) Signed: 04/30/2018 4:43:43 PM By: Montey Hora Entered By: Montey Hora on 04/30/2018 10:10:38 Jose [name] ([id number]) -------------------------------------------------------------------------------- Activities of Daily Living Details Patient Name: Jose [name], Jose [name]. Date of Service: 04/30/2018 10:15 AM Medical Record Number: [id number] Patient Account Number: [id number] Date of Birth/Sex: Apr 30, 1945 (73 y.o. M) Treating RN: Montey Hora Primary Care Winter Trefz: Golden Pop Other Clinician: Referring Leana Springston: Samara Deist Treating Davion Flannery/Extender: Melburn Hake, HOYT Weeks in Treatment: 0 Activities of Daily Living Items Answer Activities of Daily Living (Please select one for each item) Drive Automobile Completely Able Take Medications Completely Able Use Telephone Completely Able Care for Appearance Completely Able Use Toilet Completely Able Bath / Shower Completely Able Dress Self Completely Able Feed Self Completely Able Walk Completely Able Get In / Out Bed Completely Able Housework Completely Able Prepare Meals Completely  [place] for Self Completely Able Electronic Signature(s) Signed: 04/30/2018 4:43:43 PM By: Montey Hora Entered By: Montey Hora on 04/30/2018 10:11:17 Jose [name] ([id number]) -------------------------------------------------------------------------------- Education Assessment Details Patient Name: Jose [name], Jose [name]. Date of Service: 04/30/2018 10:15 AM Medical Record Number: [id number] Patient Account Number: [id number] Date of Birth/Sex: Jun 03, 1945 (73 y.o. M) Treating RN: Montey Hora Primary Care Hoang Reich: Golden Pop Other Clinician: Referring Howard Bunte: Samara Deist Treating Glynda Soliday/Extender: Melburn Hake, HOYT Weeks in Treatment: 0 Primary Learner Assessed: Patient Learning Preferences/Education Level/Primary Language Learning Preference: Explanation, Demonstration Highest Education Level: College or Above Preferred Language: English Cognitive Barrier Assessment/Beliefs Language Barrier: No Translator Needed: No Memory Deficit: No Emotional Barrier: No Cultural/Religious Beliefs Affecting Medical Care: No Physical Barrier Assessment Impaired Vision: No Impaired Hearing: No Decreased Hand dexterity: No Knowledge/Comprehension Assessment Knowledge Level: Medium Comprehension Level: Medium Ability to understand written Medium instructions: Ability to understand verbal Medium instructions: Motivation Assessment Anxiety Level: Calm Cooperation: Cooperative Education Importance: Acknowledges Need Interest in Health Problems: Asks Questions Perception: Coherent Willingness to Engage in Self- Medium Management Activities: Readiness to Engage in Self- Medium Management Activities: Electronic Signature(s) Signed: 04/30/2018 4:43:43 PM By: Montey Hora Entered By: Montey Hora on 04/30/2018 10:11:38 Jose [name] ([id number]) -------------------------------------------------------------------------------- Fall Risk  Assessment Details Patient Name: Jose [name]. Date of Service: 04/30/2018 10:15 AM Medical Record Number: [id number] Patient Account Number: [id number] Date of Birth/Sex: 21-Feb-1946 (73 y.o. M) Treating RN: Montey Hora Primary Care Sol Englert: Golden Pop Other Clinician: Referring Ledger Heindl: Samara Deist Treating Ameliyah Sarno/Extender: Melburn Hake, HOYT Weeks in Treatment: 0 Fall Risk Assessment Items Have you had 2 or more falls in the last 12 monthso 0 No Have you had any fall that resulted in injury in the last 12 monthso 0 Yes FALL RISK ASSESSMENT: History of falling - immediate or within 3 months 0 No Secondary diagnosis 0 No Ambulatory aid None/bed rest/wheelchair/nurse 0 Yes Crutches/cane/walker 0 No Furniture  0 No IV Access/Saline Lock 0 No Gait/Training Normal/bed rest/immobile 0 Yes Weak 0 No Impaired 0 No Mental Status Oriented to own ability 0 Yes Electronic Signature(s) Signed: 04/30/2018 4:43:43 PM By: Montey Hora Entered By: Montey Hora on 04/30/2018 10:11:55 Jose [name] ([id number]) -------------------------------------------------------------------------------- Foot Assessment Details Patient Name: Jose [name], Jose [name]. Date of Service: 04/30/2018 10:15 AM Medical Record Number: [id number] Patient Account Number: [id number] Date of Birth/Sex: 04-17-1945 (73 y.o. M) Treating RN: Montey Hora Primary Care Pattijo Juste: Golden Pop Other Clinician: Referring Marji Kuehnel: Samara Deist Treating Thoren Hosang/Extender: Melburn Hake, HOYT Weeks in Treatment: 0 Foot Assessment Items Site Locations + = Sensation present, - = Sensation absent, C = Callus, U = Ulcer R = Redness, W = Warmth, M = Maceration, PU = Pre-ulcerative lesion F = Fissure, S = Swelling, D = Dryness Assessment Right: Left: Other Deformity: No No Prior Foot Ulcer: No No Prior Amputation: No No Charcot Joint: No No Ambulatory Status: Ambulatory Without Help Gait: Steady Electronic  Signature(s) Signed: 04/30/2018 4:43:43 PM By: Montey Hora Entered By: Montey Hora on 04/30/2018 10:12:31 Jose [name] ([id number]) -------------------------------------------------------------------------------- Nutrition Risk Assessment Details Patient Name: Jose [name], Jose [name]. Date of Service: 04/30/2018 10:15 AM Medical Record Number: [id number] Patient Account Number: [id number] Date of Birth/Sex: [birth year] (73 y.o. M) Treating RN: Montey Hora Primary Care Mclane Arora: Golden Pop Other Clinician: Referring Avraj Lindroth: Samara Deist Treating Shawonda Kerce/Extender: STONE III, HOYT Weeks in Treatment: 0 Height (in): 67 Weight (lbs): 148.1 Body Mass Index (BMI): 23.2 Nutrition Risk Assessment Items NUTRITION RISK SCREEN: I have an illness or condition that made me change the kind and/or amount of 0 No food I eat I eat fewer than two meals per day 0 No I eat few fruits and vegetables, or milk products 0 No I have three or more drinks of beer, liquor or wine almost every day 0 No I have tooth or mouth problems that make it hard for me to eat 0 No I don't always have enough money to buy the food I need 0 No I eat alone most of the time 0 No I take three or more different prescribed or over-the-counter drugs a day 1 Yes Without wanting to, I have lost or gained 10 pounds in the last six months 0 No I am not always physically able to shop, cook and/or feed myself 0 No Nutrition Protocols Good Risk Protocol 0 No interventions needed Moderate Risk Protocol Electronic Signature(s) Signed: 04/30/2018 4:43:43 PM By: Montey Hora Entered By: Montey Hora on 04/30/2018 10:12:07

## 2018-05-03 ENCOUNTER — Other Ambulatory Visit
Admission: RE | Admit: 2018-05-03 | Discharge: 2018-05-03 | Disposition: A | Discharge status: Home / Self Care | Payer: PPO | Source: Ambulatory Visit | Attending: Infectious Diseases | Admitting: Infectious Diseases

## 2018-05-03 ENCOUNTER — Encounter: Payer: Self-pay | Admitting: Infectious Diseases

## 2018-05-03 ENCOUNTER — Ambulatory Visit: Payer: PPO | Attending: Infectious Diseases | Admitting: Infectious Diseases

## 2018-05-03 VITALS — BP 160/82 | HR 104 | Ht 67.0 in | Wt 143.0 lb

## 2018-05-03 DIAGNOSIS — T8131XD Disruption of external operation (surgical) wound, not elsewhere classified, subsequent encounter: Secondary | ICD-10-CM | POA: Diagnosis not present

## 2018-05-03 DIAGNOSIS — M25674 Stiffness of right foot, not elsewhere classified: Secondary | ICD-10-CM | POA: Diagnosis not present

## 2018-05-03 DIAGNOSIS — Z8781 Personal history of (healed) traumatic fracture: Secondary | ICD-10-CM | POA: Diagnosis not present

## 2018-05-03 DIAGNOSIS — L089 Local infection of the skin and subcutaneous tissue, unspecified: Secondary | ICD-10-CM | POA: Diagnosis not present

## 2018-05-03 DIAGNOSIS — B951 Streptococcus, group B, as the cause of diseases classified elsewhere: Secondary | ICD-10-CM

## 2018-05-03 DIAGNOSIS — B952 Enterococcus as the cause of diseases classified elsewhere: Secondary | ICD-10-CM

## 2018-05-03 DIAGNOSIS — J329 Chronic sinusitis, unspecified: Secondary | ICD-10-CM

## 2018-05-03 DIAGNOSIS — Z967 Presence of other bone and tendon implants: Secondary | ICD-10-CM | POA: Diagnosis not present

## 2018-05-03 DIAGNOSIS — M79671 Pain in right foot: Secondary | ICD-10-CM | POA: Diagnosis not present

## 2018-05-03 LAB — CBC WITH DIFFERENTIAL/PLATELET
Abs Immature Granulocytes: 0.01 10*3/uL (ref 0.00–0.07)
BASOS ABS: 0.1 10*3/uL (ref 0.0–0.1)
Basophils Relative: 1 %
EOS PCT: 3 %
Eosinophils Absolute: 0.2 10*3/uL (ref 0.0–0.5)
HEMATOCRIT: 37.2 % — AB (ref 39.0–52.0)
HEMOGLOBIN: 12.5 g/dL — AB (ref 13.0–17.0)
Immature Granulocytes: 0 %
LYMPHS ABS: 2 10*3/uL (ref 0.7–4.0)
Lymphocytes Relative: 35 %
MCH: 30.7 pg (ref 26.0–34.0)
MCHC: 33.6 g/dL (ref 30.0–36.0)
MCV: 91.4 fL (ref 80.0–100.0)
MONOS PCT: 8 %
Monocytes Absolute: 0.5 10*3/uL (ref 0.1–1.0)
NEUTROS ABS: 3.1 10*3/uL (ref 1.7–7.7)
NEUTROS PCT: 53 %
Platelets: 225 10*3/uL (ref 150–400)
RBC: 4.07 MIL/uL — ABNORMAL LOW (ref 4.22–5.81)
RDW: 12.8 % (ref 11.5–15.5)
WBC: 5.9 10*3/uL (ref 4.0–10.5)
nRBC: 0 % (ref 0.0–0.2)

## 2018-05-03 LAB — COMPREHENSIVE METABOLIC PANEL
ALBUMIN: 4.1 g/dL (ref 3.5–5.0)
ALK PHOS: 56 U/L (ref 38–126)
ALT: 22 U/L (ref 0–44)
ANION GAP: 6 (ref 5–15)
AST: 29 U/L (ref 15–41)
BUN: 16 mg/dL (ref 8–23)
CHLORIDE: 104 mmol/L (ref 98–111)
CO2: 28 mmol/L (ref 22–32)
Calcium: 9 mg/dL (ref 8.9–10.3)
Creatinine, Ser: 0.69 mg/dL (ref 0.61–1.24)
GFR calc Af Amer: >60 mL/min (ref >60)
GFR calc non Af Amer: >60 mL/min (ref >60)
Glucose, Bld: 98 mg/dL (ref 70–99)
POTASSIUM: 4.2 mmol/L (ref 3.5–5.1)
SODIUM: 138 mmol/L (ref 135–145)
Total Bilirubin: 0.3 mg/dL (ref 0.3–1.2)
Total Protein: 7.3 g/dL (ref 6.5–8.1)

## 2018-05-03 LAB — AEROBIC CULTURE W GRAM STAIN (SUPERFICIAL SPECIMEN)

## 2018-05-03 LAB — C-REACTIVE PROTEIN: CRP: 0.8 mg/dL (ref <1.0)

## 2018-05-03 LAB — AEROBIC CULTURE  (SUPERFICIAL SPECIMEN)

## 2018-05-03 LAB — SEDIMENTATION RATE: SED RATE: 14 mm/h (ref 0–20)
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
## 2018-05-03 NOTE — Progress Notes (Signed)
IZSAK, MEIR ([id number]) Visit Report for 04/30/2018 Allergy List Details Patient Name: Jose [name], Jose [name]. Date of Service: 04/30/2018 10:15 AM Medical Record Number: [id number] Patient Account Number: [id number] Date of Birth/Sex: December 09, 1945 (73 y.o. M) Treating RN: Montey Hora Primary Care Jalan Bodi: Golden Pop Other Clinician: Referring Connar Keating: Samara Deist Treating Satya Buttram/Extender: STONE III, HOYT Weeks in Treatment: 0 Allergies Active Allergies No Known Drug Allergies Allergy Notes Electronic Signature(s) Signed: 04/30/2018 4:43:43 PM By: Montey Hora Entered By: Montey Hora on 04/30/2018 10:06:59 [name] ([id number]) -------------------------------------------------------------------------------- Arrival Information Details Patient Name: Jose [name], Jose [name]. Date of Service: 04/30/2018 10:15 AM Medical Record Number: [id number] Patient Account Number: [id number] Date of Birth/Sex: Apr 08, 1945 (73 y.o. M) Treating RN: Harold Barban Primary Care Annmarie Plemmons: Golden Pop Other Clinician: Referring Dex Blakely: Samara Deist Treating Ranald Alessio/Extender: Melburn Hake, HOYT Weeks in Treatment: 0 Visit Information Patient Arrived: Ambulatory Arrival Time: 10:01 Accompanied By: self Transfer Assistance: None Patient Identification Verified: Yes Secondary Verification Process Completed: Yes Electronic Signature(s) Signed: 04/30/2018 3:28:24 PM By: Lorine Bears RCP, RRT, CHT Entered By: Lorine Bears on 04/30/2018 10:01:37 [name] ([id number]) -------------------------------------------------------------------------------- Clinic Level of Care Assessment Details Patient Name: Jose [name], Jose [name]. Date of Service: 04/30/2018 10:15 AM Medical Record Number: [id number] Patient Account Number: [id number] Date of Birth/Sex: [birth year] (73 y.o. M) Treating RN: Harold Barban Primary Care Nashly Olsson: Golden Pop Other  Clinician: Referring Nayelli Inglis: Samara Deist Treating Geary Rufo/Extender: Melburn Hake, HOYT Weeks in Treatment: 0 Clinic Level of Care Assessment Items TOOL 3 Quantity Score []  - Use when EandM and Procedure is performed on FOLLOW-UP visit 0 ASSESSMENTS - Nursing Assessment / Reassessment X - Reassessment of Co-morbidities (includes updates in patient status) 1 10 X- 1 5 Reassessment of Adherence to Treatment Plan ASSESSMENTS - Wound and Skin Assessment / Reassessment []  - Points for Wound Assessment can only be taken for a new wound of unknown or different 0 etiology and a procedure is NOT performed to that wound X- 1 5 Simple Wound Assessment / Reassessment - one wound []  - 0 Complex Wound Assessment / Reassessment - multiple wounds []  - 0 Dermatologic / Skin Assessment (not related to wound area) ASSESSMENTS - Focused Assessment []  - Circumferential Edema Measurements - multi extremities 0 []  - 0 Nutritional Assessment / Counseling / Intervention []  - 0 Lower Extremity Assessment (monofilament, tuning fork, pulses) []  - 0 Peripheral Arterial Disease Assessment (using hand held doppler) ASSESSMENTS - Ostomy and/or Continence Assessment and Care []  - Incontinence Assessment and Management 0 []  - 0 Ostomy Care Assessment and Management (repouching, etc.) PROCESS - Coordination of Care []  - Points for Discharge Coordination can only be taken for a new wound of unknown or different 0 etiology and a procedure is NOT performed to that wound X- 1 15 Simple Patient / Family Education for ongoing care []  - 0 Complex (extensive) Patient / Family Education for ongoing care X- 1 10 Staff obtains Programmer, systems, Records, Test Results / Process Orders []  - 0 Staff telephones HHA, Nursing Homes / Clarify orders / etc []  - 0 Routine Transfer to another Facility (non-emergent condition) []  - 0 Routine Hospital Admission (non-emergent condition) FIDENCIO, DUDDY ([id number]) []  - 0 New  Admissions / Biomedical engineer / Ordering NPWT, Apligraf, etc. []  - 0 Emergency Hospital Admission (emergent condition) X- 1 10 Simple Discharge Coordination []  - 0 Complex (extensive) Discharge Coordination PROCESS - Special Needs []  - Pediatric / Minor Patient Management 0 []  - 0 Isolation Patient Management []  - 0 Hearing /  Language / Visual special needs []  - 0 Assessment of Community assistance (transportation, D/C planning, etc.) []  - 0 Additional assistance / Altered mentation []  - 0 Support Surface(s) Assessment (bed, cushion, seat, etc.) INTERVENTIONS - Wound Cleansing / Measurement []  - Points for Wound Cleaning / Measurement, Wound Dressing, Specimen Collection and 0 Specimen taken to lab can only be taken for a new wound of unknown or different etiology and a procedure is NOT performed to that wound X- 1 5 Simple Wound Cleansing - one wound []  - 0 Complex Wound Cleansing - multiple wounds X- 1 5 Wound Imaging (photographs - any number of wounds) []  - 0 Wound Tracing (instead of photographs) X- 1 5 Simple Wound Measurement - one wound []  - 0 Complex Wound Measurement - multiple wounds INTERVENTIONS - Wound Dressings X - Small Wound Dressing one or multiple wounds 1 10 []  - 0 Medium Wound Dressing one or multiple wounds []  - 0 Large Wound Dressing one or multiple wounds INTERVENTIONS - Miscellaneous []  - External ear exam 0 []  - 0 Specimen Collection (cultures, biopsies, blood, body fluids, etc.) []  - 0 Specimen(s) / Culture(s) sent or taken to Lab for analysis []  - 0 Patient Transfer (multiple staff / Civil Service fast streamer / Similar devices) []  - 0 Simple Staple / Suture removal (25 or less) []  - 0 Complex Staple / Suture removal (26 or more) Welke, Derald K. ([id number]) []  - 0 Hypo / Hyperglycemic Management (close monitor of Blood Glucose) []  - 0 Ankle / Brachial Index (ABI) - do not check if billed separately X- 1 5 Vital Signs Has the patient  been seen at the hospital within the last three years: Yes Total Score: 85 Level Of Care: New/Established - Level 3 Electronic Signature(s) Signed: 05/01/2018 11:13:25 AM By: Harold Barban Entered By: Harold Barban on 04/30/2018 11:20:20 [name] ([id number]) -------------------------------------------------------------------------------- Encounter Discharge Information Details Patient Name: Jose [name], Jose [name]. Date of Service: 04/30/2018 10:15 AM Medical Record Number: [id number] Patient Account Number: [id number] Date of Birth/Sex: Oct 07, 1945 (73 y.o. M) Treating RN: Army Melia Primary Care Almira [place]: Golden Pop Other Clinician: Referring Tomeka Kantner: Samara Deist Treating Zaniah [place]/Extender: Melburn Hake, HOYT Weeks in Treatment: 0 Encounter Discharge Information Items Post Procedure Vitals Discharge Condition: Stable Temperature (F): 97.5 Ambulatory Status: Ambulatory Pulse (bpm): 64 Discharge Destination: Home Respiratory Rate (breaths/min): 16 Transportation: Private Auto Blood Pressure (mmHg): 120/60 Accompanied By: self Schedule Follow-up Appointment: Yes Clinical Summary of Care: Electronic Signature(s) Signed: 05/02/2018 9:17:40 AM By: Army Melia Entered By: Army Melia on 04/30/2018 11:30:04 [name] ([id number]) -------------------------------------------------------------------------------- Lower Extremity Assessment Details Patient Name: Jose [name], Jose [name]. Date of Service: 04/30/2018 10:15 AM Medical Record Number: [id number] Patient Account Number: [id number] Date of Birth/Sex: [birth year] (73 y.o. M) Treating RN: Montey Hora Primary Care Marlia Schewe: Golden Pop Other Clinician: Referring Laquan Beier: Samara Deist Treating Paymon Rosensteel/Extender: Melburn Hake, HOYT Weeks in Treatment: 0 Edema Assessment Assessed: [Left: No] [Right: No] Edema: [Left: N] [Right: o] Vascular Assessment Pulses: Dorsalis Pedis Palpable: [Right:Yes] Doppler  Audible: [Right:Yes] Posterior Tibial Palpable: [Right:Yes] Doppler Audible: [Right:Yes] Extremity colors, hair growth, and conditions: Extremity Color: [Right:Normal] Hair Growth on Extremity: [Right:Yes] Temperature of Extremity: [Right:Warm] Capillary Refill: [Right:< 3 seconds] Blood Pressure: Brachial: [Right:124] Dorsalis Pedis: [Left:Dorsalis Pedis: 162] Ankle: Posterior Tibial: [Left:Posterior Tibial: 158] [Right:1.31] Toe Nail Assessment Left: Right: Thick: Yes Discolored: No Deformed: No Improper Length and Hygiene: No Electronic Signature(s) Signed: 04/30/2018 4:43:43 PM By: Montey Hora Entered By: Montey Hora on 04/30/2018 10:23:27 [name] ([id number]) -------------------------------------------------------------------------------- Multi  Wound Chart Details Patient Name: Jose [name], Jose [name]. Date of Service: 04/30/2018 10:15 AM Medical Record Number: [id number] Patient Account Number: [id number] Date of Birth/Sex: 10-05-1945 (73 y.o. M) Treating RN: Harold Barban Primary Care Jarnell Cordaro: Golden Pop Other Clinician: Referring Manly Nestle: Samara Deist Treating Deon Duer/Extender: STONE III, HOYT Weeks in Treatment: 0 Vital Signs Height(in): 67 Pulse(bpm): 64 Weight(lbs): 148.1 Blood Pressure(mmHg): 120/60 Body Mass Index(BMI): 23 Temperature(F): 97.5 Respiratory Rate 16 (breaths/min): Photos: [N/A:N/A] Wound Location: Right Calcaneus - Lateral N/A N/A Wounding Event: Surgical Injury N/A N/A Primary Etiology: Open Surgical Wound N/A N/A Date Acquired: 12/29/2017 N/A N/A Weeks of Treatment: 0 N/A N/A Wound Status: Open N/A N/A Measurements L x W x D 0.5x0.6x1.2 N/A N/A (cm) Area (cm) : 0.236 N/A N/A Volume (cm) : 0.283 N/A N/A Classification: Full Thickness Without N/A N/A Exposed Support Structures Exudate Amount: Medium N/A N/A Exudate Type: Serous N/A N/A Exudate Color: amber N/A N/A Wound Margin: Flat and Intact N/A  N/A Granulation Amount: Small (1-33%) N/A N/A Granulation Quality: Pink N/A N/A Necrotic Amount: Large (67-100%) N/A N/A Exposed Structures: Fat Layer (Subcutaneous N/A N/A Tissue) Exposed: Yes Fascia: No Tendon: No Muscle: No Joint: No Bone: No Epithelialization: None N/A N/A Periwound Skin Texture: Scarring: Yes N/A N/A Excoriation: No Jose [name], Jose [name]. ([id number]) Induration: No Callus: No Crepitus: No Rash: No Periwound Skin Moisture: Maceration: Yes N/A N/A Dry/Scaly: No Periwound Skin Color: Atrophie Blanche: No N/A N/A Cyanosis: No Ecchymosis: No Erythema: No Hemosiderin Staining: No Mottled: No Pallor: No Rubor: No Temperature: No Abnormality N/A N/A Tenderness on Palpation: No N/A N/A Wound Preparation: Ulcer Cleansing: N/A N/A Rinsed/Irrigated with Saline Topical Anesthetic Applied: Other: lidocaine 4% Treatment Notes Electronic Signature(s) Signed: 05/01/2018 11:13:25 AM By: Harold Barban Entered By: Harold Barban on 04/30/2018 10:54:08 [name] ([id number]) -------------------------------------------------------------------------------- [place] Details Patient Name: Jose [name], Jose [name]. Date of Service: 04/30/2018 10:15 AM Medical Record Number: [id number] Patient Account Number: [id number] Date of Birth/Sex: [birth year] (73 y.o. M) Treating RN: Harold Barban Primary Care Carla Rashad: Golden Pop Other Clinician: Referring Estie Sproule: Samara Deist Treating Marilla Boddy/Extender: Melburn Hake, HOYT Weeks in Treatment: 0 Active Inactive Wound/Skin Impairment Nursing Diagnoses: Impaired tissue integrity Knowledge deficit related to ulceration/compromised skin integrity Goals: Ulcer/skin breakdown will have a volume reduction of 30% by week 4 Date Initiated: 04/30/2018 Target Resolution Date: 05/29/2018 Goal Status: Active Interventions: Assess patient/caregiver ability to obtain necessary supplies Assess patient/caregiver  ability to perform ulcer/skin care regimen upon admission and as needed Assess ulceration(s) every visit Provide education on ulcer and skin care Notes: Electronic Signature(s) Signed: 05/01/2018 11:13:25 AM By: Harold Barban Entered By: Harold Barban on 04/30/2018 10:53:31 [name] ([id number]) -------------------------------------------------------------------------------- Pain Assessment Details Patient Name: Jose [name], Jose [name]. Date of Service: 04/30/2018 10:15 AM Medical Record Number: [id number] Patient Account Number: [id number] Date of Birth/Sex: 04/09/1945 (73 y.o. M) Treating RN: Harold Barban Primary Care Alecia Doi: Golden Pop Other Clinician: Referring Camary Sosa: Samara Deist Treating Akeyla Molden/Extender: Melburn Hake, HOYT Weeks in Treatment: 0 Active Problems Location of Pain Severity and Description of Pain Patient Has Paino No Site Locations Pain Management and Medication Current Pain Management: Electronic Signature(s) Signed: 04/30/2018 3:28:24 PM By: Lorine Bears RCP, RRT, CHT Signed: 05/01/2018 11:13:25 AM By: Harold Barban Entered By: Lorine Bears on 04/30/2018 10:01:50 [name] ([id number]) -------------------------------------------------------------------------------- Patient/Caregiver Education Details Patient Name: Jose [name], Jose [name]. Date of Service: 04/30/2018 10:15 AM Medical Record Number: [id number] Patient Account Number: [id number] Date of Birth/Gender: 1945/05/09 (73 y.o. M) Treating RN: Harold Barban  Primary Care Physician: Golden Pop Other Clinician: Referring Physician: Samara Deist Treating Physician/Extender: Sharalyn Ink in Treatment: 0 Education Assessment Education Provided To: Patient Education Topics Provided Wound/Skin Impairment: Handouts: Caring for Your Ulcer Methods: Demonstration, Explain/Verbal Responses: State content correctly Electronic  Signature(s) Signed: 05/01/2018 11:13:25 AM By: Harold Barban Entered By: Harold Barban on 04/30/2018 10:54:25 [name] ([id number]) -------------------------------------------------------------------------------- Wound Assessment Details Patient Name: Jose [name], Jose [name]. Date of Service: 04/30/2018 10:15 AM Medical Record Number: [id number] Patient Account Number: [id number] Date of Birth/Sex: 1945-04-09 (73 y.o. M) Treating RN: Montey Hora Primary Care Sharene Krikorian: Golden Pop Other Clinician: Referring Kenyana Husak: Samara Deist Treating Sharesa Kemp/Extender: STONE III, HOYT Weeks in Treatment: 0 Wound Status Wound Number: 1 Primary Etiology: Open Surgical Wound Wound Location: Right Calcaneus - Lateral Wound Status: Open Wounding Event: Surgical Injury Date Acquired: 12/29/2017 Weeks Of Treatment: 0 Clustered Wound: No Photos Photo Uploaded By: Montey Hora on 04/30/2018 10:26:45 Wound Measurements Length: (cm) 0.5 Width: (cm) 0.6 Depth: (cm) 1.2 Area: (cm) 0.236 Volume: (cm) 0.283 % Reduction in Area: % Reduction in Volume: Epithelialization: None Tunneling: No Undermining: No Wound Description Full Thickness Without Exposed Support Classification: Structures Wound Margin: Flat and Intact Exudate Medium Amount: Exudate Type: Serous Exudate Color: amber Foul Odor After Cleansing: No Slough/Fibrino Yes Wound Bed Granulation Amount: Small (1-33%) Exposed Structure Granulation Quality: Pink Fascia Exposed: No Necrotic Amount: Large (67-100%) Fat Layer (Subcutaneous Tissue) Exposed: Yes Necrotic Quality: Adherent Slough Tendon Exposed: No Muscle Exposed: No Joint Exposed: No Bone Exposed: No Jose [name], MATSUO. ([id number]) Periwound Skin Texture Texture Color No Abnormalities Noted: No No Abnormalities Noted: No Callus: No Atrophie Blanche: No Crepitus: No Cyanosis: No Excoriation: No Ecchymosis: No Induration: No Erythema: No Rash:  No Hemosiderin Staining: No Scarring: Yes Mottled: No Pallor: No Moisture Rubor: No No Abnormalities Noted: No Dry / Scaly: No Temperature / Pain Maceration: Yes Temperature: No Abnormality Wound Preparation Ulcer Cleansing: Rinsed/Irrigated with Saline Topical Anesthetic Applied: Other: lidocaine 4%, Treatment Notes Wound #1 (Right, Lateral Calcaneus) 1. Cleansed with: Clean wound with Normal Saline 4. Dressing Applied: Iodoflex Notes Iodoflex, draw text, telfa Manufacturing systems engineer) Signed: 04/30/2018 4:43:43 PM By: Montey Hora Entered By: Montey Hora on 04/30/2018 10:16:00 [name] ([id number]) -------------------------------------------------------------------------------- [place] Details Patient Name: LIGE, LAKEMAN. Date of Service: 04/30/2018 10:15 AM Medical Record Number: [id number] Patient Account Number: [id number] Date of Birth/Sex: 1945/10/03 (73 y.o. M) Treating RN: Harold Barban Primary Care Daren Yeagle: Golden Pop Other Clinician: Referring Sarath Privott: Samara Deist Treating Maurene Hollin/Extender: STONE III, HOYT Weeks in Treatment: 0 Vital Signs Time Taken: 10:02 Temperature (F): 97.5 Height (in): 67 Pulse (bpm): 64 Source: Stated Respiratory Rate (breaths/min): 16 Weight (lbs): 148.1 Blood Pressure (mmHg): 120/60 Body Mass Index (BMI): 23.2 Reference Range: 80 - 120 mg / dl Electronic Signature(s) Signed: 04/30/2018 3:28:24 PM By: Lorine Bears RCP, RRT, CHT Entered By: Lorine Bears on 04/30/2018 10:04:24

## 2018-05-03 NOTE — Patient Instructions (Addendum)
You are here for a rt foot sinus following surgery which is infected- you had Group B streptococcus ( 1/28 and 04/19/18) and you had taken penicillin and now you are on augmentin. You had another culture done on 2/24 and that is enterococcus fecalis- Augmentin should work for this as well, but will wait for the final result of the culture- you are going to have a CT scan and depending on the result will decide whether you need any IV or continue Augmentin usually Augmentin should be fine.  Today we will do ESR/CRP/and cbc/CMp to get a baseline value We will be in touch

## 2018-05-07 ENCOUNTER — Encounter: Payer: PPO | Attending: Family Medicine | Admitting: Family Medicine

## 2018-05-07 DIAGNOSIS — W11XXXA Fall on and from ladder, initial encounter: Secondary | ICD-10-CM | POA: Insufficient documentation

## 2018-05-07 DIAGNOSIS — T8189XA Other complications of procedures, not elsewhere classified, initial encounter: Secondary | ICD-10-CM | POA: Diagnosis not present

## 2018-05-07 DIAGNOSIS — N400 Enlarged prostate without lower urinary tract symptoms: Secondary | ICD-10-CM | POA: Diagnosis not present

## 2018-05-07 DIAGNOSIS — L97412 Non-pressure chronic ulcer of right heel and midfoot with fat layer exposed: Secondary | ICD-10-CM | POA: Diagnosis not present

## 2018-05-07 DIAGNOSIS — E11621 Type 2 diabetes mellitus with foot ulcer: Secondary | ICD-10-CM | POA: Diagnosis not present

## 2018-05-07 DIAGNOSIS — T8133XA Disruption of traumatic injury wound repair, initial encounter: Secondary | ICD-10-CM | POA: Insufficient documentation

## 2018-05-07 DIAGNOSIS — L97414 Non-pressure chronic ulcer of right heel and midfoot with necrosis of bone: Secondary | ICD-10-CM | POA: Diagnosis not present

## 2018-05-07 DIAGNOSIS — M25674 Stiffness of right foot, not elsewhere classified: Secondary | ICD-10-CM | POA: Diagnosis not present

## 2018-05-07 DIAGNOSIS — M79671 Pain in right foot: Secondary | ICD-10-CM | POA: Diagnosis not present

## 2018-05-08 ENCOUNTER — Ambulatory Visit
Admission: RE | Admit: 2018-05-08 | Discharge: 2018-05-08 | Disposition: A | Discharge status: Home / Self Care | Payer: PPO | Source: Ambulatory Visit | Attending: Physician Assistant | Admitting: Physician Assistant

## 2018-05-08 ENCOUNTER — Other Ambulatory Visit: Payer: Self-pay

## 2018-05-08 DIAGNOSIS — T8131XA Disruption of external operation (surgical) wound, not elsewhere classified, initial encounter: Secondary | ICD-10-CM | POA: Insufficient documentation

## 2018-05-08 DIAGNOSIS — S91001A Unspecified open wound, right ankle, initial encounter: Secondary | ICD-10-CM | POA: Diagnosis not present

## 2018-05-08 MED ORDER — IOHEXOL 300 MG/ML  SOLN
100.0000 mL | Freq: Once | INTRAMUSCULAR | Status: AC | PRN
  Administered 2018-05-08: 100 mL via INTRAVENOUS

## 2018-05-08 NOTE — Progress Notes (Addendum)
BURL, TAUZIN ([id number]) Visit Report for 05/07/2018 Arrival Information Details Patient Name: Jose [name]. Date of Service: 05/07/2018 12:30 PM Medical Record Number: [id number] Patient Account Number: [id number] Date of Birth/Sex: 1945/12/01 (73 y.o. Male) Treating RN: Army Melia Primary Care Nathaneil Feagans: Golden Pop Other Clinician: Referring Nanda Bittick: Golden Pop Treating Chanya Chrisley/Extender: Oneida Arenas in Treatment: 1 Visit Information History Since Last Visit Added or deleted any medications: No Patient Arrived: Ambulatory Any new allergies or adverse reactions: No Arrival Time: 12:41 Had a fall or experienced change in No Accompanied By: self activities of daily living that may affect Transfer Assistance: None risk of falls: Signs or symptoms of abuse/neglect since last visito No Implantable device outside of the clinic excluding No cellular tissue based products placed in the center since last visit: Pain Present Now: No Electronic Signature(s) Signed: 05/07/2018 2:34:20 PM By: Army Melia Entered By: Army Melia on 05/07/2018 12:41:54 Jose [name] ([id number]) -------------------------------------------------------------------------------- Encounter Discharge Information Details Patient Name: Jose, [name]. Date of Service: 05/07/2018 12:30 PM Medical Record Number: [id number] Patient Account Number: [id number] Date of Birth/Sex: 31-Jul-1945 (73 y.o. Male) Treating RN: Cornell Barman Primary Care Ogle Hoeffner: Golden Pop Other Clinician: Referring Orvie Caradine: Golden Pop Treating Mercadez Heitman/Extender: Oneida Arenas in Treatment: 1 Encounter Discharge Information Items Post Procedure Vitals Discharge Condition: Stable Temperature (F): 97.6 Ambulatory Status: Ambulatory Pulse (bpm): 65 Discharge Destination: Home Respiratory Rate (breaths/min): 16 Transportation: Private Auto Blood Pressure (mmHg): 126/60 Accompanied By:  self Schedule Follow-up Appointment: Yes Clinical Summary of Care: Electronic Signature(s) Signed: 05/07/2018 4:00:49 PM By: Gretta Cool, BSN, RN, CWS, Kim RN, BSN Entered By: Gretta Cool, BSN, RN, CWS, Kim on 05/07/2018 13:26:29 Jose [name] ([id number]) -------------------------------------------------------------------------------- Lower Extremity Assessment Details Patient Name: Jose, [name]. Date of Service: 05/07/2018 12:30 PM Medical Record Number: [id number] Patient Account Number: [id number] Date of Birth/Sex: 1945/10/02 (73 y.o. Male) Treating RN: Army Melia Primary Care Sanaii Caporaso: Golden Pop Other Clinician: Referring Marisal Swarey: Golden Pop Treating Daina Cara/Extender: Beather Arbour Weeks in Treatment: 1 Edema Assessment Assessed: [Left: No] [Right: No] Edema: [Left: N] [Right: o] Vascular Assessment Pulses: Dorsalis Pedis Palpable: [Right:Yes] Posterior Tibial Extremity colors, hair growth, and conditions: Extremity Color: [Right:Normal] Hair Growth on Extremity: [Right:Yes] Temperature of Extremity: [Right:Warm] Capillary Refill: [Right:< 3 seconds] Toe Nail Assessment Left: Right: Thick: No Discolored: No Deformed: No Improper Length and Hygiene: No Electronic Signature(s) Signed: 05/07/2018 2:34:20 PM By: Army Melia Entered By: Army Melia on 05/07/2018 12:45:50 Jose [name] ([id number]) -------------------------------------------------------------------------------- Multi Wound Chart Details Patient Name: Jose [name]. Date of Service: 05/07/2018 12:30 PM Medical Record Number: [id number] Patient Account Number: [id number] Date of Birth/Sex: [birth year] (73 y.o. Male) Treating RN: Harold Barban Primary Care Shreena Baines: Golden Pop Other Clinician: Referring Enez Monahan: Golden Pop Treating Rozella Servello/Extender: Beather Arbour Weeks in Treatment: 1 Vital Signs Height(in): 67 Pulse(bpm): 65 Weight(lbs): 148.1 Blood Pressure(mmHg):  126/60 Body Mass Index(BMI): 23 Temperature(F): 97.6 Respiratory Rate 16 (breaths/min): Photos: [1:No Photos] [N/A:N/A] Wound Location: [1:Right Calcaneus - Lateral] [N/A:N/A] Wounding Event: [1:Surgical Injury] [N/A:N/A] Primary Etiology: [1:Open Surgical Wound] [N/A:N/A] Date Acquired: [1:12/29/2017] [N/A:N/A] Weeks of Treatment: [1:1] [N/A:N/A] Wound Status: [1:Open] [N/A:N/A] Measurements L x W x D [1:0.5x0.6x0.6] [N/A:N/A] (cm) Area (cm) : [1:0.236] [N/A:N/A] Volume (cm) : [1:0.141] [N/A:N/A] % Reduction in Area: [1:0.00%] [N/A:N/A] % Reduction in Volume: [1:50.20%] [N/A:N/A] Classification: [1:Full Thickness Without Exposed Support Structures] [N/A:N/A] Exudate Amount: [1:Medium] [N/A:N/A] Exudate Type: [1:Serous] [N/A:N/A] Exudate Color: [1:amber] [N/A:N/A] Wound Margin: [1:Flat and Intact] [N/A:N/A] Granulation Amount: [1:Small (1-33%)] [N/A:N/A] Granulation Quality: [1:Pink] [N/A:N/A] Necrotic  Amount: [1:Large (67-100%)] [N/A:N/A] Exposed Structures: [1:Fat Layer (Subcutaneous Tissue) Exposed: Yes Fascia: No Tendon: No Muscle: No Joint: No Bone: No] [N/A:N/A] Epithelialization: [1:None] [N/A:N/A] Periwound Skin Texture: [1:Scarring: Yes Excoriation: No Induration: No Callus: No Crepitus: No Rash: No] [N/A:N/A] Periwound Skin Moisture: [N/A:N/A] Maceration: Yes Dry/Scaly: No Periwound Skin Color: Atrophie Blanche: No N/A N/A Cyanosis: No Ecchymosis: No Erythema: No Hemosiderin Staining: No Mottled: No Pallor: No Rubor: No Temperature: No Abnormality N/A N/A Tenderness on Palpation: No N/A N/A Wound Preparation: Ulcer Cleansing: N/A N/A Rinsed/Irrigated with Saline Topical Anesthetic Applied: Other: lidocaine 4% Treatment Notes Electronic Signature(s) Signed: 05/07/2018 12:46:43 PM By: Harold Barban Entered By: Harold Barban on 05/07/2018 [place], [place] K.  ([id number]) -------------------------------------------------------------------------------- Margaret Details Patient Name: Jose, [name]. Date of Service: 05/07/2018 12:30 PM Medical Record Number: [id number] Patient Account Number: [id number] Date of Birth/Sex: Dec 10, 1945 (73 y.o. Male) Treating RN: Harold Barban Primary Care Aria Jarrard: Golden Pop Other Clinician: Referring Ivan Maskell: Golden Pop Treating Aiyah Scarpelli/Extender: Oneida Arenas in Treatment: 1 Active Inactive Wound/Skin Impairment Nursing Diagnoses: Impaired tissue integrity Knowledge deficit related to ulceration/compromised skin integrity Goals: Ulcer/skin breakdown will have a volume reduction of 30% by week 4 Date Initiated: 04/30/2018 Target Resolution Date: 05/29/2018 Goal Status: Active Interventions: Assess patient/caregiver ability to obtain necessary supplies Assess patient/caregiver ability to perform ulcer/skin care regimen upon admission and as needed Assess ulceration(s) every visit Provide education on ulcer and skin care Notes: Electronic Signature(s) Signed: 05/07/2018 12:46:33 PM By: Harold Barban Entered By: Harold Barban on 05/07/2018 [place], [place]. ([id number]) -------------------------------------------------------------------------------- Pain Assessment Details Patient Name: Jose, [name]. Date of Service: 05/07/2018 12:30 PM Medical Record Number: [id number] Patient Account Number: [id number] Date of Birth/Sex: [birth year] (73 y.o. Male) Treating RN: Army Melia Primary Care Tredarius Cobern: Golden Pop Other Clinician: Referring Jocelynne Duquette: Golden Pop Treating Mava Suares/Extender: Beather Arbour Weeks in Treatment: 1 Active Problems Location of Pain Severity and Description of Pain Patient Has Paino No Site Locations Pain Management and Medication Current Pain Management: Electronic Signature(s) Signed: 05/07/2018 2:34:20 PM By: Army Melia Entered By: Army Melia on 05/07/2018 12:42:01 Jose [name] ([id number]) -------------------------------------------------------------------------------- Patient/Caregiver Education Details Patient Name: Jose, [name]. Date of Service: 05/07/2018 12:30 PM Medical Record Number: [id number] Patient Account Number: [id number] Date of Birth/Gender: March 01, 1946 (73 y.o. Male) Treating RN: Harold Barban Primary Care Physician: Golden Pop Other Clinician: Referring Physician: Golden Pop Treating Physician/Extender: Oneida Arenas in Treatment: 1 Education Assessment Education Provided To: Patient Education Topics Provided Wound/Skin Impairment: Handouts: Caring for Your Ulcer Methods: Demonstration, Explain/Verbal Responses: State content correctly Electronic Signature(s) Signed: 05/07/2018 2:48:33 PM By: Harold Barban Entered By: Harold Barban on 05/07/2018 12:46:56 Jose [name] ([id number]) -------------------------------------------------------------------------------- Wound Assessment Details Patient Name: Jose, [name]. Date of Service: 05/07/2018 12:30 PM Medical Record Number: [id number] Patient Account Number: [id number] Date of Birth/Sex: November 01, 1945 (73 y.o. Male) Treating RN: Army Melia Primary Care Kensie Susman: Golden Pop Other Clinician: Referring Pinkie Manger: Golden Pop Treating Chanta Bauers/Extender: Beather Arbour Weeks in Treatment: 1 Wound Status Wound Number: 1 Primary Etiology: Open Surgical Wound Wound Location: Right Calcaneus - Lateral Wound Status: Open Wounding Event: Surgical Injury Date Acquired: 12/29/2017 Weeks Of Treatment: 1 Clustered Wound: No Photos Photo Uploaded By: Army Melia on 05/07/2018 13:05:01 Wound Measurements Length: (cm) 0.5 Width: (cm) 0.6 Depth: (cm) 0.6 Area: (cm) 0.236 Volume: (cm) 0.141 % Reduction in Area: 0% % Reduction in Volume: 50.2% Epithelialization: None Tunneling:  No Undermining: No Wound Description Full Thickness Without Exposed Support Foul Odo Classification:  Structures Slough/F Wound Margin: Flat and Intact Exudate Medium Amount: Exudate Type: Serous Exudate Color: amber r After Cleansing: No ibrino Yes Wound Bed Granulation Amount: Small (1-33%) Exposed Structure Granulation Quality: Pink Fascia Exposed: No Necrotic Amount: Large (67-100%) Fat Layer (Subcutaneous Tissue) Exposed: Yes Necrotic Quality: Adherent Slough Tendon Exposed: No Muscle Exposed: No Joint Exposed: No Bone Exposed: No Jose, [name]. ([id number]) Periwound Skin Texture Texture Color No Abnormalities Noted: No No Abnormalities Noted: No Callus: No Atrophie Blanche: No Crepitus: No Cyanosis: No Excoriation: No Ecchymosis: No Induration: No Erythema: No Rash: No Hemosiderin Staining: No Scarring: Yes Mottled: No Pallor: No Moisture Rubor: No No Abnormalities Noted: No Dry / Scaly: No Temperature / Pain Maceration: Yes Temperature: No Abnormality Wound Preparation Ulcer Cleansing: Rinsed/Irrigated with Saline Topical Anesthetic Applied: Other: lidocaine 4%, Treatment Notes Wound #1 (Right, Lateral Calcaneus) 1. Cleansed with: Clean wound with Normal Saline Notes Iodoform packing gauze, drawtex, telfa Manufacturing systems engineer) Signed: 05/07/2018 2:34:20 PM By: Army Melia Entered By: Army Melia on 05/07/2018 12:45:28 Quirino, Verlene Mayer ([id number]) -------------------------------------------------------------------------------- Vitals Details Patient Name: Jose, [name]. Date of Service: 05/07/2018 12:30 PM Medical Record Number: [id number] Patient Account Number: [id number] Date of Birth/Sex: 1945/11/11 (73 y.o. Male) Treating RN: Army Melia Primary Care Vernella Niznik: Golden Pop Other Clinician: Referring Aaryana Betke: Golden Pop Treating Dominico Rod/Extender: Beather Arbour Weeks in Treatment: 1 Vital Signs Time Taken:  12:42 Temperature (F): 97.6 Height (in): 67 Pulse (bpm): 65 Weight (lbs): 148.1 Respiratory Rate (breaths/min): 16 Body Mass Index (BMI): 23.2 Blood Pressure (mmHg): 126/60 Reference Range: 80 - 120 mg / dl Electronic Signature(s) Signed: 05/07/2018 2:34:20 PM By: Army Melia Entered By: Army Melia on 05/07/2018 12:42:17

## 2018-05-10 DIAGNOSIS — M79671 Pain in right foot: Secondary | ICD-10-CM | POA: Diagnosis not present

## 2018-05-10 DIAGNOSIS — M25674 Stiffness of right foot, not elsewhere classified: Secondary | ICD-10-CM | POA: Diagnosis not present

## 2018-05-13 NOTE — Progress Notes (Signed)
Jose [name], Jose [name] ([id number]) Visit Report for 05/07/2018 Chief Complaint Document Details Patient Name: Jose [name], Jose [name]. Date of Service: 05/07/2018 12:30 PM Medical Record Number: [id number] Patient Account Number: [id number] Date of Birth/Sex: 29-Dec-1945 (73 y.o. Male) Treating RN: Harold Barban Primary Care Provider: Golden Pop Other Clinician: Referring Provider: Golden Pop Treating Provider/Extender: Beather Arbour Weeks in Treatment: 1 Information Obtained from: Patient Chief Complaint Right heel surgical ulcer Electronic Signature(s) Signed: 05/08/2018 11:59:39 PM By: Beather Arbour FNP-C Entered By: Beather Arbour on 05/07/2018 [place], [place]. ([id number]) -------------------------------------------------------------------------------- Debridement Details Patient Name: Jose [name], Jose [name]. Date of Service: 05/07/2018 12:30 PM Medical Record Number: [id number] Patient Account Number: [id number] Date of Birth/Sex: 03/15/1945 (73 y.o. Male) Treating RN: Harold Barban Primary Care Provider: Golden Pop Other Clinician: Referring Provider: Golden Pop Treating Provider/Extender: Oneida Arenas in Treatment: 1 Debridement Performed for Wound #1 Right,Lateral Calcaneus Assessment: Performed By: Physician Beather Arbour, FNP Debridement Type: Debridement Level of Consciousness (Pre- Awake and Alert procedure): Pre-procedure Verification/Time Yes - 13:02 Out Taken: Start Time: 13:02 Pain Control: Lidocaine Total Area Debrided (L x W): 0.5 (cm) x 0.6 (cm) = 0.3 (cm) Tissue and other material Non-Viable, Callus, Slough, Subcutaneous, Slough debrided: Level: Skin/Subcutaneous Tissue Debridement Description: Excisional Instrument: Curette Bleeding: None End Time: 13:07 Procedural Pain: 0 Post Procedural Pain: 0 Response to Treatment: Procedure was tolerated well Level of Consciousness Awake and Alert (Post-procedure): Post Debridement  Measurements of Total Wound Length: (cm) 0.5 Width: (cm) 0.6 Depth: (cm) 0.6 Volume: (cm) 0.141 Character of Wound/Ulcer Post Debridement: Improved Post Procedure Diagnosis Same as Pre-procedure Electronic Signature(s) Signed: 05/07/2018 2:48:33 PM By: Harold Barban Signed: 05/08/2018 11:59:39 PM By: Beather Arbour FNP-C Entered By: Harold Barban on 05/07/2018 13:06:44 [name] ([id number]) -------------------------------------------------------------------------------- HPI Details Patient Name: Jose [name], Jose [name]. Date of Service: 05/07/2018 12:30 PM Medical Record Number: [id number] Patient Account Number: [id number] Date of Birth/Sex: September 21, 1945 (73 y.o. Male) Treating RN: Harold Barban Primary Care Provider: Golden Pop Other Clinician: Referring Provider: Golden Pop Treating Provider/Extender: Oneida Arenas in Treatment: 1 History of Present Illness HPI Description: 06/29/18 on evaluation today patient presents for initial evaluation our clinic for an issue that actually began December 19, 2017. He was actually climbing a ladder with the latter gateway causing him to fall he sustained a significant fracture to his right ankle requiring open reduction internal fixation of the ankle. The patient is an avid biker and not only does he ride bikes but he also builds. It has been very difficult for him to be off of his bicycle during the four months he has been attempting to heal since the surgery. Unfortunately the reason he comes to me today is that he still has an area that just will not close along the surgical incision site that he requested a second opinion regarding with wound care from his podiatrist. The patient really has no significant medical history and even for his age appears to be excellent shape in my pinion. No fevers, chills, nausea, or vomiting noted at this time. 05/07/2018 Seen today for follow up and management right foot wound s/p mechanical  fall from ladder. Prior to wound culture from wound center, he was being treated with penicillin 500 mg every 6 hours for group B strep of the wound and later changed to Augmentin 875 mg PO BID. On last wound care visit bone culture was obtained and it was positive for enterococcus faecalis and nonviable bone with Osteomyelitis of the left calcaneum. Infectious disease provider suspect  infected hardware from communicated fracture of the calcaneum. Mr. Ploeger is scheduled for CT scan this week to determine if bone has fused. There is no redness surrounding the wound today. Some areas of slough present on the base of the wound. Plan to collaborate with ID provider for antibiotic management. Electronic Signature(s) Signed: 05/08/2018 11:59:39 PM By: Beather Arbour FNP-C Entered By: Beather Arbour on 05/07/2018 22:46:20 [name] ([id number]) -------------------------------------------------------------------------------- Physical Exam Details Patient Name: Jose [name], Jose [name]. Date of Service: 05/07/2018 12:30 PM Medical Record Number: [id number] Patient Account Number: [id number] Date of Birth/Sex: [birth year] (73 y.o. Male) Treating RN: Harold Barban Primary Care Provider: Golden Pop Other Clinician: Referring Provider: Golden Pop Treating Provider/Extender: Beather Arbour Weeks in Treatment: 1 Constitutional Alert and oriented x 3. appears in no distress. Respiratory Respiratory effort is easy and symmetric bilaterally. Rate is normal at rest and on room air.. Cardiovascular Pedal pulses palpable and strong bilaterally.. Extremities are free of edema. Peripheral pulses strong and equal. Capillary refill < 3 seconds.. Gastrointestinal (GI) Abdomen is soft and non-distended without masses or tenderness. Bowel sounds active in all quadrants.. Musculoskeletal Gait and station stable.. Integumentary (Hair, Skin) open wound right LE. Notes Wound exam: Right calcaneum wound  with depth to bone. Sharp debridement today to remove necrotic tissue, surrounding callous, and slough. Reviewed pathology and plan to discuss finding with ID specialist. He is aware of scheduled CT scan and current tx plan. He may benefit from IV antibiotics therapy. Electronic Signature(s) Signed: 05/08/2018 11:59:39 PM By: Beather Arbour FNP-C Entered By: Beather Arbour on 05/07/2018 23:00:09 [name] ([id number]) -------------------------------------------------------------------------------- Physician Orders Details Patient Name: Jose [name], Jose [name]. Date of Service: 05/07/2018 12:30 PM Medical Record Number: [id number] Patient Account Number: [id number] Date of Birth/Sex: April 28, 1945 (73 y.o. Male) Treating RN: Harold Barban Primary Care Provider: Golden Pop Other Clinician: Referring Provider: Golden Pop Treating Provider/Extender: Oneida Arenas in Treatment: 1 Verbal / Phone Orders: No Diagnosis Coding Wound Cleansing Wound #1 Right,Lateral Calcaneus o Clean wound with Normal Saline. Primary Wound Dressing Wound #1 Right,Lateral Calcaneus o Iodoform packing Gauze Secondary Dressing Wound #1 Right,Lateral Calcaneus o [place] Dressing Change Frequency Wound #1 Right,Lateral Calcaneus o Change dressing every day. Follow-up Appointments Wound #1 Right,Lateral Calcaneus o Return Appointment in 1 week. Electronic Signature(s) Signed: 05/07/2018 2:48:33 PM By: Harold Barban Signed: 05/08/2018 11:59:39 PM By: Beather Arbour FNP-C Entered By: Harold Barban on 05/07/2018 13:13:02 [name] ([id number]) -------------------------------------------------------------------------------- Progress Note Details Patient Name: Jose [name], Jose [name]. Date of Service: 05/07/2018 12:30 PM Medical Record Number: [id number] Patient Account Number: [id number] Date of Birth/Sex: 06-24-1945 (73 y.o. Male) Treating RN: Harold Barban Primary  Care Provider: Golden Pop Other Clinician: Referring Provider: Golden Pop Treating Provider/Extender: Oneida Arenas in Treatment: 1 Subjective Chief Complaint Information obtained from Patient Right heel surgical ulcer History of Present Illness (HPI) 06/29/18 on evaluation today patient presents for initial evaluation our clinic for an issue that actually began December 19, 2017. He was actually climbing a ladder with the latter gateway causing him to fall he sustained a significant fracture to his right ankle requiring open reduction internal fixation of the ankle. The patient is an avid biker and not only does he ride bikes but he also builds. It has been very difficult for him to be off of his bicycle during the four months he has been attempting to heal since the surgery. Unfortunately the reason he comes to me today is that he still has an area that  just will not close along the surgical incision site that he requested a second opinion regarding with wound care from his podiatrist. The patient really has no significant medical history and even for his age appears to be excellent shape in my pinion. No fevers, chills, nausea, or vomiting noted at this time. 05/07/2018 Seen today for follow up and management right foot wound s/p mechanical fall from ladder. Prior to wound culture from wound center, he was being treated with penicillin 500 mg every 6 hours for group B strep of the wound and later changed to Augmentin 875 mg PO BID. On last wound care visit bone culture was obtained and it was positive for enterococcus faecalis and nonviable bone with Osteomyelitis of the left calcaneum. Infectious disease provider suspect infected hardware from communicated fracture of the calcaneum. Mr. Angerer is scheduled for CT scan this week to determine if bone has fused. There is no redness surrounding the wound today. Some areas of slough present on the base of the wound. Plan to  collaborate with ID provider for antibiotic management. Patient History Information obtained from Patient. Family History Cancer - Mother, Heart Disease - Mother, Hypertension - Mother, No family history of Diabetes, Hereditary Spherocytosis, Kidney Disease, Lung Disease, Seizures, Stroke, Thyroid Problems, Tuberculosis. Social History Never smoker, Marital Status - Married, Alcohol Use - Never, Drug Use - No History, Caffeine Use - Daily. Medical History Eyes Denies history of Cataracts, Glaucoma, Optic Neuritis Ear/Nose/Mouth/Throat Denies history of Chronic sinus problems/congestion, Middle ear problems Hematologic/Lymphatic Denies history of Anemia, Hemophilia, Human Immunodeficiency Virus, Lymphedema, Sickle Cell Disease Respiratory Denies history of Aspiration, Asthma, Chronic Obstructive Pulmonary Disease (COPD), Pneumothorax, Sleep Apnea, Tuberculosis Cardiovascular Denies history of Angina, Arrhythmia, Congestive Heart Failure, Coronary Artery Disease, Deep Vein Thrombosis, Jose [name], Jose [name]. ([id number]) Hypertension, Hypotension, Myocardial Infarction, Peripheral Arterial Disease, Peripheral Venous Disease, Phlebitis, Vasculitis Gastrointestinal Denies history of Cirrhosis , Colitis, Crohn s, Hepatitis A, Hepatitis B, Hepatitis C Endocrine Denies history of Type I Diabetes, Type II Diabetes Genitourinary Denies history of End Stage Renal Disease Immunological Denies history of Lupus Erythematosus, Raynaud s, Scleroderma Integumentary (Skin) Denies history of History of Burn, History of pressure wounds Musculoskeletal Denies history of Gout, Rheumatoid Arthritis, Osteoarthritis, Osteomyelitis Neurologic Denies history of Dementia, Neuropathy, Quadriplegia, Paraplegia, Seizure Disorder Oncologic Denies history of Received Chemotherapy, Received Radiation Psychiatric Denies history of Anorexia/bulimia, Confinement Anxiety Medical And Surgical History  Notes Genitourinary BPH Review of Systems (ROS) Constitutional Symptoms (General Health) The patient has no complaints or symptoms. Respiratory The patient has no complaints or symptoms. Cardiovascular The patient has no complaints or symptoms. Gastrointestinal The patient has no complaints or symptoms. Integumentary (Skin) Complains or has symptoms of Wounds - Right foot wound. Musculoskeletal The patient has no complaints or symptoms. Objective Constitutional Alert and oriented x 3. appears in no distress. Vitals Time Taken: 12:42 PM, Height: 67 in, Weight: 148.1 lbs, BMI: 23.2, Temperature: 97.6 F, Pulse: 65 bpm, Respiratory Rate: 16 breaths/min, Blood Pressure: 126/60 mmHg. Respiratory Respiratory effort is easy and symmetric bilaterally. Rate is normal at rest and on room air.Marland Kitchen Jose [name], Jose [name] ([id number]) Cardiovascular Pedal pulses palpable and strong bilaterally.. Extremities are free of edema. Peripheral pulses strong and equal. Capillary refill < 3 seconds.. Gastrointestinal (GI) Abdomen is soft and non-distended without masses or tenderness. Bowel sounds active in all quadrants.. Musculoskeletal Gait and station stable.. General Notes: Wound exam: Right calcaneum wound with depth to bone. Sharp debridement today to remove necrotic tissue, surrounding callous, and slough. Reviewed pathology  and plan to discuss finding with ID specialist. He is aware of scheduled CT scan and current tx plan. He may benefit from IV antibiotics therapy. Integumentary (Hair, Skin) open wound right LE. Wound #1 status is Open. Original cause of wound was Surgical Injury. The wound is located on the Right,Lateral Calcaneus. The wound measures 0.5cm length x 0.6cm width x 0.6cm depth; 0.236cm^2 area and 0.141cm^3 volume. There is Fat Layer (Subcutaneous Tissue) Exposed exposed. There is no tunneling or undermining noted. There is a medium amount of serous drainage noted. The wound margin  is flat and intact. There is small (1-33%) pink granulation within the wound bed. There is a large (67-100%) amount of necrotic tissue within the wound bed including Adherent Slough. The periwound skin appearance exhibited: Scarring, Maceration. The periwound skin appearance did not exhibit: Callus, Crepitus, Excoriation, Induration, Rash, Dry/Scaly, Atrophie Blanche, Cyanosis, Ecchymosis, Hemosiderin Staining, Mottled, Pallor, Rubor, Erythema. Periwound temperature was noted as No Abnormality. Procedures Wound #1 Pre-procedure diagnosis of Wound #1 is an Open Surgical Wound located on the Right,Lateral Calcaneus . There was a Excisional Skin/Subcutaneous Tissue Debridement with a total area of 0.3 sq cm performed by Beather Arbour, FNP. With the following instrument(s): Curette to remove Non-Viable tissue/material. Material removed includes Callus, Subcutaneous Tissue, and Slough after achieving pain control using Lidocaine. No specimens were taken. A time out was conducted at 13:02, prior to the start of the procedure. There was no bleeding. The procedure was tolerated well with a pain level of 0 throughout and a pain level of 0 following the procedure. Post Debridement Measurements: 0.5cm length x 0.6cm width x 0.6cm depth; 0.141cm^3 volume. Character of Wound/Ulcer Post Debridement is improved. Post procedure Diagnosis Wound #1: Same as Pre-Procedure Plan Wound Cleansing: Wound #1 Right,Lateral Calcaneus: Clean wound with Normal Saline. Primary Wound Dressing: Wound #1 Right,Lateral Calcaneus: Iodoform packing Gauze Secondary Dressing: RUE, TINNEL ([id number]) Wound #1 Right,Lateral Calcaneus: Drawtex [place] Dressing Change Frequency: Wound #1 Right,Lateral Calcaneus: Change dressing every day. Follow-up Appointments: Wound #1 Right,Lateral Calcaneus: Return Appointment in 1 week. Electronic Signature(s) Signed: 05/08/2018 11:59:39 PM By: Beather Arbour  FNP-C Entered By: Beather Arbour on 05/07/2018 23:00:30 [name] ([id number]) -------------------------------------------------------------------------------- ROS/PFSH Details Patient Name: Jose [name], Jose [name]. Date of Service: 05/07/2018 12:30 PM Medical Record Number: [id number] Patient Account Number: [id number] Date of Birth/Sex: 09/25/1945 (72 y.o. Male) Treating RN: Harold Barban Primary Care Provider: Golden Pop Other Clinician: Referring Provider: Golden Pop Treating Provider/Extender: Oneida Arenas in Treatment: 1 Information Obtained From Patient Wound History Do you currently have one or more open woundso Yes How many open wounds do you currently haveo 1 Approximately how long have you had your woundso 4 months How have you been treating your wound(s) until nowo packing strip Has your wound(s) ever healed and then re-openedo No Have you had any lab work done in the past montho No Have you tested positive for an antibiotic resistant organism (MRSA, VRE)o No Have you tested positive for osteomyelitis (bone infection)o No Have you had any tests for circulation on your legso No Integumentary (Skin) Complaints and Symptoms: Positive for: Wounds - Right foot wound Medical History: Negative for: History of Burn; History of pressure wounds Constitutional Symptoms (General Health) Complaints and Symptoms: No Complaints or Symptoms Eyes Medical History: Negative for: Cataracts; Glaucoma; Optic Neuritis Ear/Nose/Mouth/Throat Medical History: Negative for: Chronic sinus problems/congestion; Middle ear problems Hematologic/Lymphatic Medical History: Negative for: Anemia; Hemophilia; Human Immunodeficiency Virus; Lymphedema; Sickle Cell Disease Respiratory Complaints and Symptoms: No  Complaints or Symptoms Medical History: Negative for: Aspiration; Asthma; Chronic Obstructive Pulmonary Disease (COPD); Pneumothorax; Sleep Apnea; Jose [name], [place].  ([id number]) Tuberculosis Cardiovascular Complaints and Symptoms: No Complaints or Symptoms Medical History: Negative for: Angina; Arrhythmia; Congestive Heart Failure; Coronary Artery Disease; Deep Vein Thrombosis; Hypertension; Hypotension; Myocardial Infarction; Peripheral Arterial Disease; Peripheral Venous Disease; Phlebitis; Vasculitis Gastrointestinal Complaints and Symptoms: No Complaints or Symptoms Medical History: Negative for: Cirrhosis ; Colitis; Crohnos; Hepatitis A; Hepatitis B; Hepatitis C Endocrine Medical History: Negative for: Type I Diabetes; Type II Diabetes Genitourinary Medical History: Negative for: End Stage Renal Disease Past Medical History Notes: BPH Immunological Medical History: Negative for: Lupus Erythematosus; Raynaudos; Scleroderma Musculoskeletal Complaints and Symptoms: No Complaints or Symptoms Medical History: Negative for: Gout; Rheumatoid Arthritis; Osteoarthritis; Osteomyelitis Neurologic Medical History: Negative for: Dementia; Neuropathy; Quadriplegia; Paraplegia; Seizure Disorder Oncologic Medical History: Negative for: Received Chemotherapy; Received Radiation Psychiatric Medical History: Negative for: Anorexia/bulimia; Confinement Anxiety Jose [name], FILIP ([id number]) Immunizations Pneumococcal Vaccine: Received Pneumococcal Vaccination: Yes Immunization Notes: up to date Implantable Devices None Family and Social History Cancer: Yes - Mother; Diabetes: No; Heart Disease: Yes - Mother; Hereditary Spherocytosis: No; Hypertension: Yes - Mother; Kidney Disease: No; Lung Disease: No; Seizures: No; Stroke: No; Thyroid Problems: No; Tuberculosis: No; Never smoker; Marital Status - Married; Alcohol Use: Never; Drug Use: No History; Caffeine Use: Daily; Financial Concerns: No; Food, Clothing or Shelter Needs: No; Support System Lacking: No; Transportation Concerns: No; Advanced Directives: No; Patient does not want information  on Advanced Directives Physician Affirmation I have reviewed and agree with the above information. Electronic Signature(s) Signed: 05/08/2018 11:59:39 PM By: Beather Arbour FNP-C Signed: 05/11/2018 3:21:09 PM By: Harold Barban Entered By: Beather Arbour on 05/07/2018 22:47:38 [place], Verlene Mayer ([id number]) -------------------------------------------------------------------------------- SuperBill Details Patient Name: BRESLIN, HEMANN. Date of Service: 05/07/2018 Medical Record Number: [id number] Patient Account Number: [id number] Date of Birth/Sex: 26-Jul-1945 (73 y.o. 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

## 2018-05-14 ENCOUNTER — Encounter: Payer: PPO | Admitting: Physician Assistant

## 2018-05-14 DIAGNOSIS — M79671 Pain in right foot: Secondary | ICD-10-CM | POA: Diagnosis not present

## 2018-05-14 DIAGNOSIS — T8133XA Disruption of traumatic injury wound repair, initial encounter: Secondary | ICD-10-CM | POA: Diagnosis not present

## 2018-05-14 DIAGNOSIS — T8189XA Other complications of procedures, not elsewhere classified, initial encounter: Secondary | ICD-10-CM | POA: Diagnosis not present

## 2018-05-14 DIAGNOSIS — L97412 Non-pressure chronic ulcer of right heel and midfoot with fat layer exposed: Secondary | ICD-10-CM | POA: Diagnosis not present

## 2018-05-14 DIAGNOSIS — M25674 Stiffness of right foot, not elsewhere classified: Secondary | ICD-10-CM | POA: Diagnosis not present

## 2018-05-15 DIAGNOSIS — L97412 Non-pressure chronic ulcer of right heel and midfoot with fat layer exposed: Secondary | ICD-10-CM | POA: Diagnosis not present

## 2018-05-15 DIAGNOSIS — T8131XA Disruption of external operation (surgical) wound, not elsewhere classified, initial encounter: Secondary | ICD-10-CM | POA: Diagnosis not present

## 2018-05-15 NOTE — Progress Notes (Signed)
CADEN, FUKUSHIMA ([id number]) Visit Report for 05/14/2018 Arrival Information Details Patient Name: Jose Jose [name], Jose [name]. Date of Service: 05/14/2018 12:45 PM Medical Record Number: [id number] Patient Account Number: [id number] Date of Birth/Sex: 1945/12/14 (73 y.o. M) Treating RN: Army Melia Primary Care Jose Jose [name] Jose [name]: Golden Pop Other Clinician: Referring Declyn Delsol: Golden Pop Treating Chandler Swiderski/Extender: Melburn Hake, HOYT Weeks in Treatment: 2 Visit Information History Since Last Visit Added or deleted any medications: No Patient Arrived: Ambulatory Any new allergies or adverse reactions: No Arrival Time: 12:48 Had a fall or experienced change in No Accompanied By: self activities of daily living that may affect Transfer Assistance: None risk of falls: Signs or symptoms of abuse/neglect since last visito No Hospitalized since last visit: No Implantable device outside of the clinic excluding No cellular tissue based products placed in the center since last visit: Has Dressing in Place as Prescribed: Yes Pain Present Now: No Electronic Signature(s) Signed: 05/14/2018 3:41:24 PM By: Army Melia Entered By: Army Melia on 05/14/2018 12:48:20 Jose Jose [name] Jose [name] ([id number]) -------------------------------------------------------------------------------- Clinic Level of Care Assessment Details Patient Name: Jose Jose [name] Jose [name]. Date of Service: 05/14/2018 12:45 PM Medical Record Number: [id number] Patient Account Number: [id number] Date of Birth/Sex: Jun 04, 1945 (73 y.o. M) Treating RN: Harold Barban Primary Care Ryn Peine: Golden Pop Other Clinician: Referring Breylan Lefevers: Golden Pop Treating Salomon Ganser/Extender: Melburn Hake, HOYT Weeks in Treatment: 2 Clinic Level of Care Assessment Items TOOL 4 Quantity Score []  - Use when only an EandM is performed on FOLLOW-UP visit 0 ASSESSMENTS - Nursing Assessment / Reassessment X - Reassessment of Co-morbidities (includes updates in patient  status) 1 10 X- 1 5 Reassessment of Adherence to Treatment Plan ASSESSMENTS - Wound and Skin Assessment / Reassessment X - Simple Wound Assessment / Reassessment - one wound 1 5 []  - 0 Complex Wound Assessment / Reassessment - multiple wounds []  - 0 Dermatologic / Skin Assessment (not related to wound area) ASSESSMENTS - Focused Assessment []  - Circumferential Edema Measurements - multi extremities 0 []  - 0 Nutritional Assessment / Counseling / Intervention []  - 0 Lower Extremity Assessment (monofilament, tuning fork, pulses) []  - 0 Peripheral Arterial Disease Assessment (using hand held doppler) ASSESSMENTS - Ostomy and/or Continence Assessment and Care []  - Incontinence Assessment and Management 0 []  - 0 Ostomy Care Assessment and Management (repouching, etc.) PROCESS - Coordination of Care []  - Simple Patient / Family Education for ongoing care 0 []  - 0 Complex (extensive) Patient / Family Education for ongoing care X- 1 10 Staff obtains Programmer, systems, Records, Test Results / Process Orders []  - 0 Staff telephones HHA, Nursing Homes / Clarify orders / etc []  - 0 Routine Transfer to another Facility (non-emergent condition) []  - 0 Routine Hospital Admission (non-emergent condition) []  - 0 New Admissions / Biomedical engineer / Ordering NPWT, Apligraf, etc. []  - 0 Emergency Hospital Admission (emergent condition) X- 1 10 Simple Discharge Coordination Jose Jose [name], Jose [name]. ([id number]) []  - 0 Complex (extensive) Discharge Coordination PROCESS - Special Needs []  - Pediatric / Minor Patient Management 0 []  - 0 Isolation Patient Management []  - 0 Hearing / Language / Visual special needs []  - 0 Assessment of Community assistance (transportation, D/C planning, etc.) []  - 0 Additional assistance / Altered mentation []  - 0 Support Surface(s) Assessment (bed, cushion, seat, etc.) INTERVENTIONS - Wound Cleansing / Measurement X - Simple Wound Cleansing - one wound 1 5 []  -  0 Complex Wound Cleansing - multiple wounds X- 1 5 Wound Imaging (photographs - any number of wounds) []  - 0 Wound  Tracing (instead of photographs) X- 1 5 Simple Wound Measurement - one wound []  - 0 Complex Wound Measurement - multiple wounds INTERVENTIONS - Wound Dressings X - Small Wound Dressing one or multiple wounds 1 10 []  - 0 Medium Wound Dressing one or multiple wounds []  - 0 Large Wound Dressing one or multiple wounds []  - 0 Application of Medications - topical []  - 0 Application of Medications - injection INTERVENTIONS - Miscellaneous []  - External ear exam 0 []  - 0 Specimen Collection (cultures, biopsies, blood, body fluids, etc.) []  - 0 Specimen(s) / Culture(s) sent or taken to Lab for analysis []  - 0 Patient Transfer (multiple staff / Civil Service fast streamer / Similar devices) []  - 0 Simple Staple / Suture removal (25 or less) []  - 0 Complex Staple / Suture removal (26 or more) []  - 0 Hypo / Hyperglycemic Management (close monitor of Blood Glucose) []  - 0 Ankle / Brachial Index (ABI) - do not check if billed separately X- 1 5 Vital Signs Jose [name], Jose Jose [name] K. ([id number]) Has the patient been seen at the hospital within the last three years: Yes Total Score: 70 Level Of Care: New/Established - Level 2 Electronic Signature(s) Signed: 05/14/2018 5:00:09 PM By: Harold Barban Entered By: Harold Barban on 05/14/2018 13:35:40 Jose Jose [name] Jose [name] ([id number]) -------------------------------------------------------------------------------- Encounter Discharge Information Details Patient Name: Jose Jose [name], Jose [name]. Date of Service: 05/14/2018 12:45 PM Medical Record Number: [id number] Patient Account Number: [id number] Date of Birth/Sex: [birth year] (73 y.o. M) Treating RN: Army Melia Primary Care Eymi Lipuma: Golden Pop Other Clinician: Referring Codi Kertz: Golden Pop Treating Jayston Trevino/Extender: Melburn Hake, HOYT Weeks in Treatment: 2 Encounter Discharge Information  Items Discharge Condition: Stable Ambulatory Status: Ambulatory Discharge Destination: Home Transportation: Private Auto Accompanied By: self Schedule Follow-up Appointment: Yes Clinical Summary of Care: Electronic Signature(s) Signed: 05/14/2018 3:41:24 PM By: Army Melia Entered By: Army Melia on 05/14/2018 13:54:13 Jose Jose [name] Jose [name], Jose Jose [name] Jose [name] ([id number]) -------------------------------------------------------------------------------- Lower Extremity Assessment Details Patient Name: Jose Jose [name], Jose [name]. Date of Service: 05/14/2018 12:45 PM Medical Record Number: [id number] Patient Account Number: [id number] Date of Birth/Sex: 30-Apr-1945 (73 y.o. M) Treating RN: Army Melia Primary Care Jashay Roddy: Golden Pop Other Clinician: Referring Husain Costabile: Golden Pop Treating Alenna Russell/Extender: Melburn Hake, HOYT Weeks in Treatment: 2 Edema Assessment Assessed: [Left: No] [Right: No] Edema: [Left: N] [Right: o] Vascular Assessment Pulses: Dorsalis Pedis Palpable: [Right:Yes] Posterior Tibial Extremity colors, hair growth, and conditions: Extremity Color: [Right:Normal] Hair Growth on Extremity: [Right:Yes] Temperature of Extremity: [Right:Warm] Capillary Refill: [Right:< 3 seconds] Toe Nail Assessment Left: Right: Thick: No Discolored: No Deformed: No Improper Length and Hygiene: No Electronic Signature(s) Signed: 05/14/2018 3:41:24 PM By: Army Melia Entered By: Army Melia on 05/14/2018 12:52:32 Jose Jose [name] Jose [name] ([id number]) -------------------------------------------------------------------------------- Multi Wound Chart Details Patient Name: Jose Jose [name] Jose [name]. Date of Service: 05/14/2018 12:45 PM Medical Record Number: [id number] Patient Account Number: [id number] Date of Birth/Sex: [birth year] (73 y.o. M) Treating RN: Harold Barban Primary Care Gunther Zawadzki: Golden Pop Other Clinician: Referring Alicha Raspberry: Golden Pop Treating Aasir Daigler/Extender: STONE III, HOYT Weeks in  Treatment: 2 Vital Signs Height(in): 67 Pulse(bpm): 63 Weight(lbs): 148.1 Blood Pressure(mmHg): 128/51 Body Mass Index(BMI): 23 Temperature(F): 97.5 Respiratory Rate 16 (breaths/min): Photos: [1:No Photos] [N/A:N/A] Wound Location: [1:Right Calcaneus - Lateral] [N/A:N/A] Wounding Event: [1:Surgical Injury] [N/A:N/A] Primary Etiology: [1:Open Surgical Wound] [N/A:N/A] Date Acquired: [1:12/29/2017] [N/A:N/A] Weeks of Treatment: [1:2] [N/A:N/A] Wound Status: [1:Open] [N/A:N/A] Measurements L x W x D [1:0.5x0.4x0.7] [N/A:N/A] (cm) Area (cm) : [1:0.157] [N/A:N/A] Volume (cm) : [1:0.11] [N/A:N/A] % Reduction in Area: [1:33.50%] [N/A:N/A] % Reduction in Volume: [  1:61.10%] [N/A:N/A] Classification: [1:Full Thickness Without Exposed Support Structures] [N/A:N/A] Exudate Amount: [1:Medium] [N/A:N/A] Exudate Type: [1:Serous] [N/A:N/A] Exudate Color: [1:amber] [N/A:N/A] Wound Margin: [1:Flat and Intact] [N/A:N/A] Granulation Amount: [1:Small (1-33%)] [N/A:N/A] Granulation Quality: [1:Pink] [N/A:N/A] Necrotic Amount: [1:Large (67-100%)] [N/A:N/A] Exposed Structures: [1:Fat Layer (Subcutaneous Tissue) Exposed: Yes Fascia: No Tendon: No Muscle: No Joint: No Bone: No] [N/A:N/A] Epithelialization: [1:None] [N/A:N/A] Periwound Skin Texture: [1:Scarring: Yes Excoriation: No Induration: No Callus: No Crepitus: No Rash: No] [N/A:N/A] Periwound Skin Moisture: [N/A:N/A] Maceration: Yes Dry/Scaly: No Periwound Skin Color: Atrophie Blanche: No N/A N/A Cyanosis: No Ecchymosis: No Erythema: No Hemosiderin Staining: No Mottled: No Pallor: No Rubor: No Temperature: No Abnormality N/A N/A Tenderness on Palpation: No N/A N/A Wound Preparation: Ulcer Cleansing: N/A N/A Rinsed/Irrigated with Saline Topical Anesthetic Applied: Other: lidocaine 4% Treatment Notes Electronic Signature(s) Signed: 05/14/2018 5:00:09 PM By: Harold Barban Entered By: Harold Barban on 05/14/2018  13:19:35 Jose Jose [name] Jose [name] ([id number]) -------------------------------------------------------------------------------- Daisy Details Patient Name: Jose Jose [name], Jose [name]. Date of Service: 05/14/2018 12:45 PM Medical Record Number: [id number] Patient Account Number: [id number] Date of Birth/Sex: 1946-02-27 (73 y.o. M) Treating RN: Harold Barban Primary Care Jamin Humphries: Golden Pop Other Clinician: Referring Yanissa Michalsky: Golden Pop Treating Pearson Picou/Extender: Melburn Hake, HOYT Weeks in Treatment: 2 Active Inactive Wound/Skin Impairment Nursing Diagnoses: Impaired tissue integrity Knowledge deficit related to ulceration/compromised skin integrity Goals: Ulcer/skin breakdown will have a volume reduction of 30% by week 4 Date Initiated: 04/30/2018 Target Resolution Date: 05/29/2018 Goal Status: Active Interventions: Assess patient/caregiver ability to obtain necessary supplies Assess patient/caregiver ability to perform ulcer/skin care regimen upon admission and as needed Assess ulceration(s) every visit Provide education on ulcer and skin care Notes: Electronic Signature(s) Signed: 05/14/2018 5:00:09 PM By: Harold Barban Entered By: Harold Barban on 05/14/2018 13:19:20 Jose Jose [name] Jose [name] ([id number]) -------------------------------------------------------------------------------- Pain Assessment Details Patient Name: Jose Jose [name], Jose [name]. Date of Service: 05/14/2018 12:45 PM Medical Record Number: [id number] Patient Account Number: [id number] Date of Birth/Sex: 1945/12/25 (73 y.o. M) Treating RN: Army Melia Primary Care Jaydalyn Demattia: Golden Pop Other Clinician: Referring Euan Wandler: Golden Pop Treating Lequan Dobratz/Extender: Melburn Hake, HOYT Weeks in Treatment: 2 Active Problems Location of Pain Severity and Description of Pain Patient Has Paino No Site Locations Pain Management and Medication Current Pain Management: Electronic Signature(s) Signed: 05/14/2018  3:41:24 PM By: Army Melia Entered By: Army Melia on 05/14/2018 12:48:26 Jose Jose [name] Jose [name] ([id number]) -------------------------------------------------------------------------------- Patient/Caregiver Education Details Patient Name: Jose Jose [name], Jose [name]. Date of Service: 05/14/2018 12:45 PM Medical Record Number: [id number] Patient Account Number: [id number] Date of Birth/Gender: 1945-09-14 (73 y.o. M) Treating RN: Harold Barban Primary Care Physician: Golden Pop Other Clinician: Referring Physician: Golden Pop Treating Physician/Extender: Sharalyn Ink in Treatment: 2 Education Assessment Education Provided To: Patient Education Topics Provided Wound/Skin Impairment: Handouts: Caring for Your Ulcer Methods: Demonstration, Explain/Verbal Responses: State content correctly Electronic Signature(s) Signed: 05/14/2018 5:00:09 PM By: Harold Barban Entered By: Harold Barban on 05/14/2018 13:23:45 Jose Jose [name] Jose [name] ([id number]) -------------------------------------------------------------------------------- Wound Assessment Details Patient Name: Jose Jose [name], Jose [name]. Date of Service: 05/14/2018 12:45 PM Medical Record Number: [id number] Patient Account Number: [id number] Date of Birth/Sex: 08-20-1945 (73 y.o. M) Treating RN: Army Melia Primary Care Michiel Sivley: Golden Pop Other Clinician: Referring Lorriann Hansmann: Golden Pop Treating Erek Kowal/Extender: STONE III, HOYT Weeks in Treatment: 2 Wound Status Wound Number: 1 Primary Etiology: Open Surgical Wound Wound Location: Right Calcaneus - Lateral Wound Status: Open Wounding Event: Surgical Injury Date Acquired: 12/29/2017 Weeks Of Treatment: 2 Clustered Wound: No Photos Photo Uploaded By: Montey Hora on 05/14/2018 14:49:12 Wound Measurements  Length: (cm) 0.5 Width: (cm) 0.4 Depth: (cm) 0.7 Area: (cm) 0.157 Volume: (cm) 0.11 % Reduction in Area: 33.5% % Reduction in Volume: 61.1% Epithelialization:  None Tunneling: No Undermining: No Wound Description Full Thickness Without Exposed Support Classification: Structures Wound Margin: Flat and Intact Exudate Medium Amount: Exudate Type: Serous Exudate Color: amber Foul Odor After Cleansing: No Slough/Fibrino Yes Wound Bed Granulation Amount: Small (1-33%) Exposed Structure Granulation Quality: Pink Fascia Exposed: No Necrotic Amount: Large (67-100%) Fat Layer (Subcutaneous Tissue) Exposed: Yes Necrotic Quality: Adherent Slough Tendon Exposed: No Muscle Exposed: No Joint Exposed: No Bone Exposed: No Jose Jose [name] Jose [name], Jose Jose [name] Jose [name]. ([id number]) Periwound Skin Texture Texture Color No Abnormalities Noted: No No Abnormalities Noted: No Callus: No Atrophie Blanche: No Crepitus: No Cyanosis: No Excoriation: No Ecchymosis: No Induration: No Erythema: No Rash: No Hemosiderin Staining: No Scarring: Yes Mottled: No Pallor: No Moisture Rubor: No No Abnormalities Noted: No Dry / Scaly: No Temperature / Pain Maceration: Yes Temperature: No Abnormality Wound Preparation Ulcer Cleansing: Rinsed/Irrigated with Saline Topical Anesthetic Applied: Other: lidocaine 4%, Treatment Notes Wound #1 (Right, Lateral Calcaneus) Notes prisma, Iodoform packing gauze, drawtex, telfa Manufacturing systems engineer) Signed: 05/14/2018 3:41:24 PM By: Army Melia Entered By: Army Melia on 05/14/2018 12:52:05 Jose Jose [name] Jose [name] ([id number]) -------------------------------------------------------------------------------- Vitals Details Patient Name: Jose Jose [name], Jose [name]. Date of Service: 05/14/2018 12:45 PM Medical Record Number: [id number] Patient Account Number: [id number] Date of Birth/Sex: Jul 06, 1945 (73 y.o. M) Treating RN: Army Melia Primary Care Geetika Laborde: Golden Pop Other Clinician: Referring Shayley Medlin: Golden Pop Treating Apolonia Ellwood/Extender: Melburn Hake, HOYT Weeks in Treatment: 2 Vital Signs Time Taken: 12:47 Temperature (F):  97.5 Height (in): 67 Pulse (bpm): 63 Weight (lbs): 148.1 Respiratory Rate (breaths/min): 16 Body Mass Index (BMI): 23.2 Blood Pressure (mmHg): 128/51 Reference Range: 80 - 120 mg / dl Electronic Signature(s) Signed: 05/14/2018 3:41:24 PM By: Army Melia Entered By: Army Melia on 05/14/2018 12:48:43

## 2018-05-15 NOTE — Progress Notes (Signed)
KEVON, TENCH ([id number]) Visit Report for 05/14/2018 Chief Complaint Document Details Patient Name: Jose [name], Jose [name]. Date of Service: 05/14/2018 12:45 PM Medical Record Number: [id number] Patient Account Number: [id number] Date of Birth/Sex: 1945/04/26 (73 y.o. M) Treating RN: Harold Barban Primary Care Provider: Golden Pop Other Clinician: Referring Provider: Golden Pop Treating Provider/Extender: Melburn Hake, HOYT Weeks in Treatment: 2 Information Obtained from: Patient Chief Complaint Right heel surgical ulcer Electronic Signature(s) Signed: 05/14/2018 4:38:41 PM By: Worthy Keeler PA-C Entered By: Worthy Keeler on 05/14/2018 13:16:58 Paradise, Verlene Mayer ([id number]) -------------------------------------------------------------------------------- HPI Details Patient Name: Jose [name], Jose [name]. Date of Service: 05/14/2018 12:45 PM Medical Record Number: [id number] Patient Account Number: [id number] Date of Birth/Sex: Dec 23, 1945 (73 y.o. M) Treating RN: Harold Barban Primary Care Provider: Golden Pop Other Clinician: Referring Provider: Golden Pop Treating Provider/Extender: Melburn Hake, HOYT Weeks in Treatment: 2 History of Present Illness HPI Description: 06/29/18 on evaluation today patient presents for initial evaluation our clinic for an issue that actually began December 19, 2017. He was actually climbing a ladder with the latter gateway causing him to fall he sustained a significant fracture to his right ankle requiring open reduction internal fixation of the ankle. The patient is an avid biker and not only does he ride bikes but he also builds. It has been very difficult for him to be off of his bicycle during the four months he has been attempting to heal since the surgery. Unfortunately the reason he comes to me today is that he still has an area that just will not close along the surgical incision site that he requested a second opinion regarding with wound care  from his podiatrist. The patient really has no significant medical history and even for his age appears to be excellent shape in my pinion. No fevers, chills, nausea, or vomiting noted at this time. 05/07/2018 Seen today for follow up and management right foot wound s/p mechanical fall from ladder. Prior to wound culture from wound center, he was being treated with penicillin 500 mg every 6 hours for group B strep of the wound and later changed to Augmentin 875 mg PO BID. On last wound care visit bone culture was obtained and it was positive for enterococcus faecalis and nonviable bone with Osteomyelitis of the left calcaneum. Infectious disease provider suspect infected hardware from communicated fracture of the calcaneum. Mr. Fedora is scheduled for CT scan this week to determine if bone has fused. There is no redness surrounding the wound today. Some areas of slough present on the base of the wound. Plan to collaborate with ID provider for antibiotic management. 05/14/18 on evaluation today patient actually appears to be doing better in regard to the overall appearance of the wound during evaluation today. With that being said we have undertaken the further testing that we had recommended here in the clinic when I first saw him. Again his CT scan which was performed show that there was a large defect in the central aspect of the calcaneus where there is no osteophyte material or bone graft identified. This measured approximately 2.5 x 2.1 cm. This is stated possibly be resort to changes or osteomyelitis again I'm unsure of exactly which we would be looking at here. Again the patient does have a prior bone culture which was obtained by myself as well during the first visit that I saw him. This revealed enterococcus as the organism in question and the pathology report again subsequently also showed nonviable bone with osteomyelitis. Again all  this couple together makes be concerned about what the  appropriate next treatment options should be at this point. The patient has seen his podiatrist last Thursday but it was felt that everything was doing "okay" and that he just need to continue with wound care. With that being said I'm somewhat leaning towards referring the patient for a second opinion in this regard with everything going on at this point. Electronic Signature(s) Signed: 05/14/2018 4:38:41 PM By: Worthy Keeler PA-C Entered By: Worthy Keeler on 05/14/2018 16:35:37 Burgner, Verlene Mayer ([id number]) -------------------------------------------------------------------------------- Physical Exam Details Patient Name: Jose [name], Jose [name]. Date of Service: 05/14/2018 12:45 PM Medical Record Number: [id number] Patient Account Number: [id number] Date of Birth/Sex: Mar 22, 1945 (73 y.o. M) Treating RN: Harold Barban Primary Care Provider: Golden Pop Other Clinician: Referring Provider: Golden Pop Treating Provider/Extender: STONE III, HOYT Weeks in Treatment: 2 Constitutional Well-nourished and well-hydrated in no acute distress. Respiratory normal breathing without difficulty. clear to auscultation bilaterally. Cardiovascular regular rate and rhythm with normal S1, S2. Psychiatric this patient is able to make decisions and demonstrates good insight into disease process. Alert and Oriented x 3. pleasant and cooperative. Notes Upon inspection today patient's wound bed actually appears to be doing fairly well as far as the size of the opening. There still is either bone or hardware palpable in the base of the wound with a probe. I cannot see exactly what I'm touching at this point nonetheless it does have me concerned with everything else were seeing from a testing standpoint that we may be dealing with more of a hardware infection/osteomyelitis type situation. Electronic Signature(s) Signed: 05/14/2018 4:38:41 PM By: Worthy Keeler PA-C Entered By: Worthy Keeler on 05/14/2018  16:37:12 Malesky, Verlene Mayer ([id number]) -------------------------------------------------------------------------------- Physician Orders Details Patient Name: JHONY, ANTRIM. Date of Service: 05/14/2018 12:45 PM Medical Record Number: [id number] Patient Account Number: [id number] Date of Birth/Sex: March 24, 1945 (73 y.o. 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 Primary Wound Dressing Wound #1 Right,Lateral Calcaneus o Silver Collagen - Apply collagen to wound bed every other day o Iodoform packing Gauze - pack gauze on top of collagen Secondary Dressing Wound #1 Right,Lateral Calcaneus o [place] Dressing Change Frequency Wound #1 Right,Lateral Calcaneus o Change dressing every day. Follow-up Appointments Wound #1 Right,Lateral Calcaneus o Return Appointment in 1 week. Electronic Signature(s) Signed: 05/14/2018 4:38:41 PM By: Worthy Keeler PA-C Signed: 05/14/2018 5:00:09 PM By: Harold Barban Entered By: Harold Barban on 05/14/2018 13:42:18 [name] ([id number]) -------------------------------------------------------------------------------- Problem List Details Patient Name: DONAVAN, KERLIN. Date of Service: 05/14/2018 12:45 PM Medical Record Number: [id number] Patient Account Number: [id number] Date of Birth/Sex: 1945/07/17 (73 y.o. M) Treating RN: Harold Barban Primary Care Provider: Golden Pop Other Clinician: Referring Provider: Golden Pop Treating Provider/Extender: Melburn Hake, HOYT Weeks in Treatment: 2 Active Problems ICD-10 Evaluated Encounter Code Description Active Date Today Diagnosis T81.31XA Disruption of external operation (surgical) wound, not 04/30/2018 No Yes elsewhere classified, initial encounter L97.414 Non-pressure chronic ulcer of right heel and midfoot with 04/30/2018 No Yes necrosis of bone Inactive Problems Resolved Problems Electronic Signature(s) Signed: 05/14/2018 4:38:41  PM By: Worthy Keeler PA-C Entered By: Worthy Keeler on 05/14/2018 13:16:47 Weldon, Verlene Mayer ([id number]) -------------------------------------------------------------------------------- Progress Note Details Patient Name: KHALIF, STENDER. Date of Service: 05/14/2018 12:45 PM Medical Record Number: [id number] Patient Account Number: [id number] Date of Birth/Sex: 09-02-1945 (73 y.o. M) Treating RN: Harold Barban Primary Care Provider: Golden Pop Other Clinician: Referring Provider: Golden Pop Treating Provider/Extender: Melburn Hake, HOYT Weeks in Treatment: 2 Subjective Chief Complaint Information obtained from Patient Right heel surgical ulcer History of Present Illness (HPI) 06/29/18 on evaluation today patient presents for initial evaluation our clinic for an issue that actually began December 19, 2017. He was actually climbing a ladder with the latter gateway causing him to fall he sustained a significant fracture to his right ankle requiring open reduction internal fixation of the ankle. The patient is an avid biker and not only does he ride bikes but he also builds. It has been very difficult for him to be off of his bicycle during the four months he has been attempting to heal since the surgery. Unfortunately the reason he comes to me today is that he still has an area that just will not close along the surgical incision site that he requested a second opinion regarding with wound care from his podiatrist.  The patient really has no significant medical history and even for his age appears to be excellent shape in my pinion. No fevers, chills, nausea, or vomiting noted at this time. 05/07/2018 Seen today for follow up and management right foot wound s/p mechanical fall from ladder. Prior to wound culture from wound center, he was being treated with penicillin 500 mg every 6 hours for group B strep of the wound and later changed to Augmentin 875 mg PO BID. On last wound care visit bone culture was obtained and it was positive for enterococcus faecalis and nonviable bone with Osteomyelitis of the left calcaneum. Infectious disease provider suspect infected hardware from communicated fracture of the calcaneum. Mr. Lockridge is scheduled for CT scan this week to determine if bone has fused. There is no redness surrounding the wound today. Some areas of slough present on the base of the wound. Plan to collaborate with ID provider for antibiotic management. 05/14/18 on evaluation today patient actually appears to be doing better in regard to the overall appearance of the wound during evaluation today. With that being said we have undertaken the further testing that we had recommended here in the clinic when I first saw him. Again his CT scan which was performed show that there was a large defect in the central aspect of the calcaneus where there is no osteophyte material or bone graft identified. This measured approximately 2.5 x 2.1 cm. This is stated possibly be resort to changes or osteomyelitis again I'm unsure of exactly which we would be looking at here. Again the patient does have a prior bone culture which was obtained by myself as well during the first visit that I saw him. This revealed enterococcus as the organism in question and the pathology report again subsequently also showed nonviable bone with osteomyelitis. Again all this couple together makes be concerned about what the appropriate next treatment  options should be at this point. The patient has seen his podiatrist last Thursday but it was felt that everything was doing "okay" and that he just need to continue with wound care. With that being said I'm somewhat leaning towards referring the patient for a second opinion in this regard with everything  going on at this point. Patient History Information obtained from Patient. Family History Cancer - Mother, Heart Disease - Mother, Hypertension - Mother, No family history of Diabetes, Hereditary Spherocytosis, Kidney Disease, Lung Disease, Seizures, Stroke, Thyroid Problems, Tuberculosis. Social History Never smoker, Marital Status - Married, Alcohol Use - Never, Drug Use - No History, Caffeine Use - Daily. NYLES, MITTON ([id number]) Medical History Eyes Denies history of Cataracts, Glaucoma, Optic Neuritis Ear/Nose/Mouth/Throat Denies history of Chronic sinus problems/congestion, Middle ear problems Hematologic/Lymphatic Denies history of Anemia, Hemophilia, Human Immunodeficiency Virus, Lymphedema, Sickle Cell Disease Respiratory Denies history of Aspiration, Asthma, Chronic Obstructive Pulmonary Disease (COPD), Pneumothorax, Sleep Apnea, Tuberculosis Cardiovascular Denies history of Angina, Arrhythmia, Congestive Heart Failure, Coronary Artery Disease, Deep Vein Thrombosis, Hypertension, Hypotension, Myocardial Infarction, Peripheral Arterial Disease, Peripheral Venous Disease, Phlebitis, Vasculitis Gastrointestinal Denies history of Cirrhosis , Colitis, Crohn s, Hepatitis A, Hepatitis B, Hepatitis C Endocrine Denies history of Type I Diabetes, Type II Diabetes Genitourinary Denies history of End Stage Renal Disease Immunological Denies history of Lupus Erythematosus, Raynaud s, Scleroderma Integumentary (Skin) Denies history of History of Burn, History of pressure wounds Musculoskeletal Denies history of Gout, Rheumatoid Arthritis, Osteoarthritis,  Osteomyelitis Neurologic Denies history of Dementia, Neuropathy, Quadriplegia, Paraplegia, Seizure Disorder Oncologic Denies history of Received Chemotherapy, Received Radiation Psychiatric Denies history of Anorexia/bulimia, Confinement Anxiety Medical And Surgical History Notes Genitourinary BPH Review of Systems (ROS) Constitutional Symptoms (General Health) Denies complaints or symptoms of Fever, Chills. Respiratory The patient has no complaints or symptoms. Cardiovascular The patient has no complaints or symptoms. Psychiatric The patient has no complaints or symptoms. Objective Constitutional Well-nourished and well-hydrated in no acute distress. NEVADA, MULLETT ([id number]) Vitals Time Taken: 12:47 PM, Height: 67 in, Weight: 148.1 lbs, BMI: 23.2, Temperature: 97.5 F, Pulse: 63 bpm, Respiratory Rate: 16 breaths/min, Blood Pressure: 128/51 mmHg. Respiratory normal breathing without difficulty. clear to auscultation bilaterally. Cardiovascular regular rate and rhythm with normal S1, S2. Psychiatric this patient is able to make decisions and demonstrates good insight into disease process. Alert and Oriented x 3. pleasant and cooperative. General Notes: Upon inspection today patient's wound bed actually appears to be doing fairly well as far as the size of the opening. There still is either bone or hardware palpable in the base of the wound with a probe. I cannot see exactly what I'm touching at this point nonetheless it does have me concerned with everything else were seeing from a testing standpoint that we may be dealing with more of a hardware infection/osteomyelitis type situation. Integumentary (Hair, Skin) Wound #1 status is Open. Original cause of wound was Surgical Injury. The wound is located on the Right,Lateral Calcaneus. The wound measures 0.5cm length x 0.4cm width x 0.7cm depth; 0.157cm^2 area and 0.11cm^3 volume. There is Fat Layer (Subcutaneous Tissue)  Exposed exposed. There is no tunneling or undermining noted. There is a medium amount of serous drainage noted. The wound margin is flat and intact. There is small (1-33%) pink granulation within the wound bed. There is a large (67-100%) amount of necrotic tissue within the wound bed including Adherent Slough. The periwound skin appearance exhibited: Scarring, Maceration. The periwound skin appearance did not exhibit: Callus, Crepitus, Excoriation, Induration, Rash, Dry/Scaly, Atrophie Blanche, Cyanosis, Ecchymosis, Hemosiderin Staining, Mottled, Pallor, Rubor, Erythema. Periwound temperature was noted as No Abnormality. 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 Primary Wound Dressing: Wound #1 Right,Lateral Calcaneus: Silver Collagen - Apply collagen to wound bed every other day Iodoform packing Gauze - pack gauze on top of collagen Secondary Dressing: Wound #1 Right,Lateral Calcaneus: OSWALDO, CUETO ([id number]) [place] Change Frequency: Wound #1 Right,Lateral Calcaneus: Change dressing every day. Follow-up Appointments: Wound #1 Right,Lateral Calcaneus: Return Appointment in 1 week. My suggestion at this point is gonna be that we go ahead and initiate the continuation of the above wound care measures for the next week. Again the continue with normal good wound care until we can get them in to see Dr. Doran Durand who is the orthopedic foot and ankle specialist with [facility]. We will subsequently see were things stand at follow-up. Please see above for specific wound care orders. We will see patient for re-evaluation in 1 week(s) here in the clinic. If anything worsens or changes patient will contact our  office for additional recommendations. Electronic Signature(s) Signed: 05/14/2018 4:38:41 PM By: Worthy Keeler PA-C Entered By: Worthy Keeler on 05/14/2018 16:38:00 JAQUARIUS, SEDER ([id number]) -------------------------------------------------------------------------------- ROS/PFSH Details Patient Name: Jose [name], Jose [name]. Date of Service: 05/14/2018 12:45 PM Medical Record Number: [id number] Patient Account Number: [id number] Date of Birth/Sex: 1945/06/07 (73 y.o. M) Treating RN: Harold Barban Primary Care Provider: Golden Pop Other Clinician: Referring Provider: Golden Pop Treating Provider/Extender: Melburn Hake, HOYT Weeks in Treatment: 2 Information Obtained From Patient Wound History Do you currently have one or more open woundso Yes How many open wounds do you currently haveo 1 Approximately how long have you had your woundso 4 months How have you been treating your wound(s) until nowo packing strip Has your wound(s) ever healed and then re-openedo No Have you had any lab work done in the past montho No Have you tested positive for an antibiotic resistant organism (MRSA, VRE)o No Have you tested positive for osteomyelitis (bone infection)o No Have you had any tests for circulation on your legso No Constitutional Symptoms (General Health) Complaints and Symptoms: Negative for: Fever; Chills Eyes Medical History: Negative for: Cataracts; Glaucoma; Optic Neuritis Ear/Nose/Mouth/Throat Medical History: Negative for: Chronic sinus problems/congestion; Middle ear problems Hematologic/Lymphatic Medical History: Negative for: Anemia; Hemophilia; Human Immunodeficiency Virus; Lymphedema; Sickle Cell Disease Respiratory Complaints and Symptoms: No Complaints or Symptoms Medical History: Negative for: Aspiration; Asthma; Chronic Obstructive Pulmonary Disease (COPD); Pneumothorax; Sleep Apnea; Tuberculosis Cardiovascular Complaints and Symptoms: No Complaints or  Symptoms Medical History: Negative for: Angina; Arrhythmia; Congestive Heart Failure; Coronary Artery Disease; Deep Vein Thrombosis; YARNELL, ARVIDSON K. ([id number]) Hypertension; Hypotension; Myocardial Infarction; Peripheral Arterial Disease; Peripheral Venous Disease; Phlebitis; Vasculitis Gastrointestinal Medical History: Negative for: Cirrhosis ; Colitis; Crohnos; Hepatitis A; Hepatitis B; Hepatitis C Endocrine Medical History: Negative for: Type I Diabetes; Type II Diabetes Genitourinary Medical History: Negative for: End Stage Renal Disease Past Medical History Notes: BPH Immunological Medical History: Negative for: Lupus Erythematosus; Raynaudos; Scleroderma Integumentary (Skin) Medical History: Negative for: History of Burn; History of pressure wounds Musculoskeletal Medical History: Negative for: Gout; Rheumatoid Arthritis; Osteoarthritis; Osteomyelitis Neurologic Medical History: Negative for: Dementia; Neuropathy; Quadriplegia; Paraplegia; Seizure Disorder Oncologic Medical History: Negative for: Received Chemotherapy; Received Radiation Psychiatric Complaints and Symptoms: No Complaints or Symptoms Medical History: Negative for: Anorexia/bulimia; Confinement Anxiety Immunizations Pneumococcal Vaccine: Received Pneumococcal Vaccination: Yes Immunization Notes: up to date UZIAH, SORTER ([id number]) Implantable Devices None Family and Social History Cancer: Yes - Mother; Diabetes: No; Heart Disease: Yes - Mother; Hereditary Spherocytosis: No; Hypertension: Yes - Mother; Kidney  Disease: No; Lung Disease: No; Seizures: No; Stroke: No; Thyroid Problems: No; Tuberculosis: No; Never smoker; Marital Status - Married; Alcohol Use: Never; Drug Use: No History; Caffeine Use: Daily; Financial Concerns: No; Food, Clothing or Shelter Needs: No; Support System Lacking: No; Transportation Concerns: No; Advanced Directives: No; Patient does not want information on  Advanced Directives Physician Affirmation I have reviewed and agree with the above information. Electronic Signature(s) Signed: 05/14/2018 4:38:41 PM By: Worthy Keeler PA-C Signed: 05/14/2018 5:00:09 PM By: Harold Barban Entered By: Worthy Keeler on 05/14/2018 16:36:30 Fazzino, Verlene Mayer ([id number]) -------------------------------------------------------------------------------- SuperBill Details Patient Name: ZECHARIAH, BISSONNETTE. Date of Service: 05/14/2018 Medical Record Number: [id number] Patient Account Number: [id number] Date of Birth/Sex: 06-22-1945 (73 y.o. 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

## 2018-05-17 ENCOUNTER — Ambulatory Visit: Payer: PPO | Admitting: Infectious Diseases

## 2018-05-17 DIAGNOSIS — M25674 Stiffness of right foot, not elsewhere classified: Secondary | ICD-10-CM | POA: Diagnosis not present

## 2018-05-17 DIAGNOSIS — M79671 Pain in right foot: Secondary | ICD-10-CM | POA: Diagnosis not present

## 2018-05-17 NOTE — Progress Notes (Deleted)
Talked to patient briefly but no formal visit. Will review his CT with [facility] and get back to him regarding Iv antibiotic

## 2018-05-21 ENCOUNTER — Other Ambulatory Visit: Payer: Self-pay

## 2018-05-21 ENCOUNTER — Encounter: Payer: PPO | Admitting: Physician Assistant

## 2018-05-21 DIAGNOSIS — M79671 Pain in right foot: Secondary | ICD-10-CM | POA: Diagnosis not present

## 2018-05-21 DIAGNOSIS — T8133XA Disruption of traumatic injury wound repair, initial encounter: Secondary | ICD-10-CM | POA: Diagnosis not present

## 2018-05-21 DIAGNOSIS — T8189XA Other complications of procedures, not elsewhere classified, initial encounter: Secondary | ICD-10-CM | POA: Diagnosis not present

## 2018-05-21 DIAGNOSIS — L97412 Non-pressure chronic ulcer of right heel and midfoot with fat layer exposed: Secondary | ICD-10-CM | POA: Diagnosis not present

## 2018-05-21 DIAGNOSIS — T8131XA Disruption of external operation (surgical) wound, not elsewhere classified, initial encounter: Secondary | ICD-10-CM | POA: Diagnosis not present

## 2018-05-21 DIAGNOSIS — M25674 Stiffness of right foot, not elsewhere classified: Secondary | ICD-10-CM | POA: Diagnosis not present

## 2018-05-22 NOTE — Progress Notes (Signed)
SAVIOR, HIMEBAUGH ([id number]) Visit Report for 05/21/2018 Arrival Information Details Patient Name: Jose [name], Jose [name]. Date of Service: 05/21/2018 9:15 AM Medical Record Number: [id number] Patient Account Number: [id number] Date of Birth/Sex: Nov 08, 1945 (73 y.o. M) Treating RN: Montey Hora Primary Care Darlin Stenseth: Golden Pop Other Clinician: Referring Gregoria Selvy: Golden Pop Treating Jatziri Goffredo/Extender: Melburn Hake, HOYT Weeks in Treatment: 3 Visit Information History Since Last Visit Added or deleted any medications: No Patient Arrived: Ambulatory Any new allergies or adverse reactions: No Arrival Time: 09:19 Had a fall or experienced change in No Accompanied By: self activities of daily living that may affect Transfer Assistance: None risk of falls: Patient Identification Verified: Yes Signs or symptoms of abuse/neglect since last visito No Secondary Verification Process Completed: Yes Hospitalized since last visit: No Implantable device outside of the clinic excluding No cellular tissue based products placed in the center since last visit: Has Dressing in Place as Prescribed: Yes Pain Present Now: No Electronic Signature(s) Signed: 05/21/2018 4:22:24 PM By: Montey Hora Entered By: Montey Hora on 05/21/2018 09:19:45 Jose [name] ([id number]) -------------------------------------------------------------------------------- Clinic Level of Care Assessment Details Patient Name: Jose [name]. Date of Service: 05/21/2018 9:15 AM Medical Record Number: [id number] Patient Account Number: [id number] Date of Birth/Sex: 1945-11-24 (73 y.o. M) Treating RN: Harold Barban Primary Care Kajuan Guyton: Golden Pop Other Clinician: Referring Naomi Fitton: Golden Pop Treating Jesalyn Finazzo/Extender: Melburn Hake, HOYT Weeks in Treatment: 3 Clinic Level of Care Assessment Items TOOL 4 Quantity Score []  - Use when only an EandM is performed on FOLLOW-UP visit 0 ASSESSMENTS - Nursing  Assessment / Reassessment []  - Reassessment of Co-morbidities (includes updates in patient status) 0 []  - 0 Reassessment of Adherence to Treatment Plan ASSESSMENTS - Wound and Skin Assessment / Reassessment X - Simple Wound Assessment / Reassessment - one wound 1 5 []  - 0 Complex Wound Assessment / Reassessment - multiple wounds []  - 0 Dermatologic / Skin Assessment (not related to wound area) ASSESSMENTS - Focused Assessment []  - Circumferential Edema Measurements - multi extremities 0 []  - 0 Nutritional Assessment / Counseling / Intervention []  - 0 Lower Extremity Assessment (monofilament, tuning fork, pulses) []  - 0 Peripheral Arterial Disease Assessment (using hand held doppler) ASSESSMENTS - Ostomy and/or Continence Assessment and Care []  - Incontinence Assessment and Management 0 []  - 0 Ostomy Care Assessment and Management (repouching, etc.) PROCESS - Coordination of Care X - Simple Patient / Family Education for ongoing care 1 15 []  - 0 Complex (extensive) Patient / Family Education for ongoing care X- 1 10 Staff obtains Programmer, systems, Records, Test Results / Process Orders []  - 0 Staff telephones HHA, Nursing Homes / Clarify orders / etc []  - 0 Routine Transfer to another Facility (non-emergent condition) []  - 0 Routine Hospital Admission (non-emergent condition) []  - 0 New Admissions / Biomedical engineer / Ordering NPWT, Apligraf, etc. []  - 0 Emergency Hospital Admission (emergent condition) X- 1 10 Simple Discharge Coordination SOHIL, TIMKO. ([id number]) []  - 0 Complex (extensive) Discharge Coordination PROCESS - Special Needs []  - Pediatric / Minor Patient Management 0 []  - 0 Isolation Patient Management []  - 0 Hearing / Language / Visual special needs []  - 0 Assessment of Community assistance (transportation, D/C planning, etc.) []  - 0 Additional assistance / Altered mentation []  - 0 Support Surface(s) Assessment (bed, cushion, seat,  etc.) INTERVENTIONS - Wound Cleansing / Measurement X - Simple Wound Cleansing - one wound 1 5 []  - 0 Complex Wound Cleansing - multiple wounds X- 1 5 Wound Imaging (photographs -  any number of wounds) []  - 0 Wound Tracing (instead of photographs) X- 1 5 Simple Wound Measurement - one wound []  - 0 Complex Wound Measurement - multiple wounds INTERVENTIONS - Wound Dressings X - Small Wound Dressing one or multiple wounds 1 10 []  - 0 Medium Wound Dressing one or multiple wounds []  - 0 Large Wound Dressing one or multiple wounds []  - 0 Application of Medications - topical []  - 0 Application of Medications - injection INTERVENTIONS - Miscellaneous []  - External ear exam 0 []  - 0 Specimen Collection (cultures, biopsies, blood, body fluids, etc.) []  - 0 Specimen(s) / Culture(s) sent or taken to Lab for analysis []  - 0 Patient Transfer (multiple staff / Civil Service fast streamer / Similar devices) []  - 0 Simple Staple / Suture removal (25 or less) []  - 0 Complex Staple / Suture removal (26 or more) []  - 0 Hypo / Hyperglycemic Management (close monitor of Blood Glucose) []  - 0 Ankle / Brachial Index (ABI) - do not check if billed separately X- 1 5 Vital Signs Bazzi, Manav K. ([id number]) Has the patient been seen at the hospital within the last three years: Yes Total Score: 70 Level Of Care: New/Established - Level 2 Electronic Signature(s) Signed: 05/21/2018 5:03:00 PM By: Harold Barban Entered By: Harold Barban on 05/21/2018 09:43:27 Jose [name] ([id number]) -------------------------------------------------------------------------------- Encounter Discharge Information Details Patient Name: Jose [name], Jose [name]. Date of Service: 05/21/2018 9:15 AM Medical Record Number: [id number] Patient Account Number: [id number] Date of Birth/Sex: March 01, 1946 (73 y.o. M) Treating RN: Army Melia Primary Care Julian Askin: Golden Pop Other Clinician: Referring Loyce Klasen: Golden Pop Treating Koni Kannan/Extender: Melburn Hake, HOYT Weeks in Treatment: 3 Encounter Discharge Information Items Discharge Condition: Stable Ambulatory Status: Ambulatory Discharge Destination: Home Transportation: Private Auto Accompanied By: self Schedule Follow-up Appointment: Yes Clinical Summary of Care: Electronic Signature(s) Signed: 05/21/2018 10:05:08 AM By: Army Melia Entered By: Army Melia on 05/21/2018 09:58:06 Jose [name] ([id number]) -------------------------------------------------------------------------------- Lower Extremity Assessment Details Patient Name: Jose [name], Jose [name]. Date of Service: 05/21/2018 9:15 AM Medical Record Number: [id number] Patient Account Number: [id number] Date of Birth/Sex: 1945/10/22 (73 y.o. M) Treating RN: Montey Hora Primary Care Myrtle Barnhard: Golden Pop Other Clinician: Referring Alfonso Carden: Golden Pop Treating Kayo Zion/Extender: Melburn Hake, HOYT Weeks in Treatment: 3 Vascular Assessment Pulses: Dorsalis Pedis Palpable: [Right:Yes] Posterior Tibial Extremity colors, hair growth, and conditions: Extremity Color: [Right:Normal] Hair Growth on Extremity: [Right:Yes] Temperature of Extremity: [Right:Warm] Capillary Refill: [Right:< 3 seconds] Toe Nail Assessment Left: Right: Thick: Yes Discolored: Yes Deformed: No Improper Length and Hygiene: No Electronic Signature(s) Signed: 05/21/2018 4:22:24 PM By: Montey Hora Entered By: Montey Hora on 05/21/2018 09:26:50 Jose [name] ([id number]) -------------------------------------------------------------------------------- Multi Wound Chart Details Patient Name: Jose [name], Jose [name]. Date of Service: 05/21/2018 9:15 AM Medical Record Number: [id number] Patient Account Number: [id number] Date of Birth/Sex: 09-20-1945 (73 y.o. M) Treating RN: Harold Barban Primary Care Solace Manwarren: Golden Pop Other Clinician: Referring Katelind Pytel: Golden Pop Treating  Lucia Harm/Extender: STONE III, HOYT Weeks in Treatment: 3 Vital Signs Height(in): 67 Pulse(bpm): 69 Weight(lbs): 148.1 Blood Pressure(mmHg): 130/70 Body Mass Index(BMI): 23 Temperature(F): 97.8 Respiratory Rate 16 (breaths/min): Photos: [N/A:N/A] Wound Location: Right Calcaneus - Lateral N/A N/A Wounding Event: Surgical Injury N/A N/A Primary Etiology: Open Surgical Wound N/A N/A Date Acquired: 12/29/2017 N/A N/A Weeks of Treatment: 3 N/A N/A Wound Status: Open N/A N/A Measurements L x W x D 0.3x0.4x0.8 N/A N/A (cm) Area (cm) : 0.094 N/A N/A Volume (cm) : 0.075 N/A N/A % Reduction in Area: 60.20% N/A N/A %  Reduction in Volume: 73.50% N/A N/A Classification: Full Thickness Without N/A N/A Exposed Support Structures Exudate Amount: Medium N/A N/A Exudate Type: Purulent N/A N/A Exudate Color: yellow, brown, green N/A N/A Wound Margin: Flat and Intact N/A N/A Granulation Amount: Medium (34-66%) N/A N/A Granulation Quality: Pink N/A N/A Necrotic Amount: Medium (34-66%) N/A N/A Exposed Structures: Fat Layer (Subcutaneous N/A N/A Tissue) Exposed: Yes Fascia: No Tendon: No Muscle: No Joint: No Bone: No Epithelialization: None N/A N/A ALBI, [place]. ([id number]) Periwound Skin Texture: Scarring: Yes N/A N/A Excoriation: No Induration: No Callus: No Crepitus: No Rash: No Periwound Skin Moisture: Maceration: Yes N/A N/A Dry/Scaly: No Periwound Skin Color: Atrophie Blanche: No N/A N/A Cyanosis: No Ecchymosis: No Erythema: No Hemosiderin Staining: No Mottled: No Pallor: No Rubor: No Temperature: No Abnormality N/A N/A Tenderness on Palpation: Yes N/A N/A Wound Preparation: Ulcer Cleansing: N/A N/A Rinsed/Irrigated with Saline Topical Anesthetic Applied: Other: lidocaine 4% Treatment Notes Electronic Signature(s) Signed: 05/21/2018 5:03:00 PM By: Harold Barban Entered By: Harold Barban on 05/21/2018 09:36:24 Jose [name]  ([id number]) -------------------------------------------------------------------------------- [place] Details Patient Name: Jose [name], Jose [name]. Date of Service: 05/21/2018 9:15 AM Medical Record Number: [id number] Patient Account Number: [id number] Date of Birth/Sex: 24-Aug-1945 (73 y.o. M) Treating RN: Harold Barban Primary Care Ricard Faulkner: Golden Pop Other Clinician: Referring Daesha Insco: Golden Pop Treating [place] Gustafson/Extender: Melburn Hake, HOYT Weeks in Treatment: 3 Active Inactive Wound/Skin Impairment Nursing Diagnoses: Impaired tissue integrity Knowledge deficit related to ulceration/compromised skin integrity Goals: Ulcer/skin breakdown will have a volume reduction of 30% by week 4 Date Initiated: 04/30/2018 Target Resolution Date: 05/29/2018 Goal Status: Active Interventions: Assess patient/caregiver ability to obtain necessary supplies Assess patient/caregiver ability to perform ulcer/skin care regimen upon admission and as needed Assess ulceration(s) every visit Provide education on ulcer and skin care Notes: Electronic Signature(s) Signed: 05/21/2018 5:03:00 PM By: Harold Barban Entered By: Harold Barban on 05/21/2018 09:36:15 Jose [name] ([id number]) -------------------------------------------------------------------------------- Pain Assessment Details Patient Name: Jose [name], Jose [name]. Date of Service: 05/21/2018 9:15 AM Medical Record Number: [id number] Patient Account Number: [id number] Date of Birth/Sex: [birth year] (73 y.o. M) Treating RN: Montey Hora Primary Care Ivyonna Hoelzel: Golden Pop Other Clinician: Referring Iyahna Obriant: Golden Pop Treating Adya Wirz/Extender: Melburn Hake, HOYT Weeks in Treatment: 3 Active Problems Location of Pain Severity and Description of Pain Patient Has Paino Yes Site Locations Pain Location: Pain in Ulcers With Dressing Change: Yes Duration of the Pain. Constant / Intermittento  Intermittent Character of Pain Describe the Pain: Aching Pain Management and Medication Current Pain Management: Electronic Signature(s) Signed: 05/21/2018 4:22:24 PM By: Montey Hora Entered By: Montey Hora on 05/21/2018 09:20:02 Jose [name] ([id number]) -------------------------------------------------------------------------------- Patient/Caregiver Education Details Patient Name: Jose [name], Jose [name]. Date of Service: 05/21/2018 9:15 AM Medical Record Number: [id number] Patient Account Number: [id number] Date of Birth/Gender: [birth year] (73 y.o. M) Treating RN: Harold Barban Primary Care Physician: Golden Pop Other Clinician: Referring Physician: Golden Pop Treating Physician/Extender: Sharalyn Ink in Treatment: 3 Education Assessment Education Provided To: Patient Education Topics Provided Wound/Skin Impairment: Handouts: Caring for Your Ulcer Methods: Demonstration, Explain/Verbal Responses: State content correctly Electronic Signature(s) Signed: 05/21/2018 5:03:00 PM By: Harold Barban Entered By: Harold Barban on 05/21/2018 09:36:40 Jose [name], Jose [name] ([id number]) -------------------------------------------------------------------------------- Wound Assessment Details Patient Name: Jose [name], Jose [name]. Date of Service: 05/21/2018 9:15 AM Medical Record Number: [id number] Patient Account Number: [id number] Date of Birth/Sex: [birth year] (73 y.o. M) Treating RN: Montey Hora Primary Care Brantley Naser: Golden Pop Other Clinician: Referring Fatou Dunnigan: Golden Pop Treating Tricia Pledger/Extender: STONE III, HOYT Weeks in  Treatment: 3 Wound Status Wound Number: 1 Primary Etiology: Open Surgical Wound Wound Location: Right Calcaneus - Lateral Wound Status: Open Wounding Event: Surgical Injury Date Acquired: 12/29/2017 Weeks Of Treatment: 3 Clustered Wound: No Photos Wound Measurements Length: (cm) 0.3 Width: (cm) 0.4 Depth: (cm) 0.8 Area:  (cm) 0.094 Volume: (cm) 0.075 % Reduction in Area: 60.2% % Reduction in Volume: 73.5% Epithelialization: None Tunneling: No Undermining: No Wound Description Full Thickness Without Exposed Support Foul Odo Classification: Structures Slough/F Wound Margin: Flat and Intact Exudate Medium Amount: Exudate Type: Purulent Exudate Color: yellow, brown, green r After Cleansing: No ibrino Yes Wound Bed Granulation Amount: Medium (34-66%) Exposed Structure Granulation Quality: Pink Fascia Exposed: No Necrotic Amount: Medium (34-66%) Fat Layer (Subcutaneous Tissue) Exposed: Yes Necrotic Quality: Adherent Slough Tendon Exposed: No Muscle Exposed: No Joint Exposed: No Bone Exposed: No Periwound Skin Texture Jose [name], Jose [name]. ([id number]) Texture Color No Abnormalities Noted: No No Abnormalities Noted: No Callus: No Atrophie Blanche: No Crepitus: No Cyanosis: No Excoriation: No Ecchymosis: No Induration: No Erythema: No Rash: No Hemosiderin Staining: No Scarring: Yes Mottled: No Pallor: No Moisture Rubor: No No Abnormalities Noted: No Dry / Scaly: No Temperature / Pain Maceration: Yes Temperature: No Abnormality Tenderness on Palpation: Yes Wound Preparation Ulcer Cleansing: Rinsed/Irrigated with Saline Topical Anesthetic Applied: Other: lidocaine 4%, Treatment Notes Wound #1 (Right, Lateral Calcaneus) Notes prisma, Iodoform packing gauze, drawtex, telfa Manufacturing systems engineer) Signed: 05/21/2018 4:22:24 PM By: Montey Hora Entered By: Montey Hora on 05/21/2018 09:26:17 Jose [name] ([id number]) -------------------------------------------------------------------------------- Vitals Details Patient Name: Jose [name], Jose [name]. Date of Service: 05/21/2018 9:15 AM Medical Record Number: [id number] Patient Account Number: [id number] Date of Birth/Sex: Sep 28, 1945 (73 y.o. M) Treating RN: Montey Hora Primary Care Saroya Riccobono: Golden Pop Other  Clinician: Referring Nadean Montanaro: Golden Pop Treating Sharren Schnurr/Extender: Melburn Hake, HOYT Weeks in Treatment: 3 Vital Signs Time Taken: 09:20 Temperature (F): 97.8 Height (in): 67 Pulse (bpm): 69 Weight (lbs): 148.1 Respiratory Rate (breaths/min): 16 Body Mass Index (BMI): 23.2 Blood Pressure (mmHg): 130/70 Reference Range: 80 - 120 mg / dl Electronic Signature(s) Signed: 05/21/2018 4:22:24 PM By: Montey Hora Entered By: Montey Hora on 05/21/2018 09:21:58

## 2018-05-23 DIAGNOSIS — M79671 Pain in right foot: Secondary | ICD-10-CM | POA: Diagnosis not present

## 2018-05-23 NOTE — Progress Notes (Signed)
LION, FERNANDEZ ([id number]) Visit Report for 05/21/2018 Chief Complaint Document Details Patient Name: Jose [name], Jose [name]. Date of Service: 05/21/2018 9:15 AM Medical Record Number: [id number] Patient Account Number: [id number] Date of Birth/Sex: 12/09/1945 (73 y.o. M) Treating RN: Harold Barban Primary Care Provider: Golden Pop Other Clinician: Referring Provider: Golden Pop Treating Provider/Extender: Melburn Hake, Courtnee Myer Weeks in Treatment: 3 Information Obtained from: Patient Chief Complaint Right heel surgical ulcer Electronic Signature(s) Signed: 05/22/2018 2:10:29 PM By: Worthy Keeler PA-C Entered By: Worthy Keeler on 05/21/2018 09:33:52 [place], Verlene Mayer ([id number]) -------------------------------------------------------------------------------- HPI Details Patient Name: Jose, [name]. Date of Service: 05/21/2018 9:15 AM Medical Record Number: [id number] Patient Account Number: [id number] Date of Birth/Sex: [birth year] (73 y.o. M) Treating RN: Harold Barban Primary Care Provider: Golden Pop Other Clinician: Referring Provider: Golden Pop Treating Provider/Extender: Melburn Hake, Atreus Hasz Weeks in Treatment: 3 History of Present Illness HPI Description: 06/29/18 on evaluation today patient presents for initial evaluation our clinic for an issue that actually began December 19, 2017. He was actually climbing a ladder with the latter gateway causing him to fall he sustained a significant fracture to his right ankle requiring open reduction internal fixation of the ankle. The patient is an avid biker and not only does he ride bikes but he also builds. It has been very difficult for him to be off of his bicycle during the four months he has been attempting to heal since the surgery. Unfortunately the reason he comes to me today is that he still has an area that just will not close along the surgical incision site that he requested a second opinion regarding with wound  care from his podiatrist. The patient really has no significant medical history and even for his age appears to be excellent shape in my pinion. No fevers, chills, nausea, or vomiting noted at this time. 05/07/2018 Seen today for follow up and management right foot wound s/p mechanical fall from ladder. Prior to wound culture from wound center, he was being treated with penicillin 500 mg every 6 hours for group B strep of the wound and later changed to Augmentin 875 mg PO BID. On last wound care visit bone culture was obtained and it was positive for enterococcus faecalis and nonviable bone with Osteomyelitis of the left calcaneum. Infectious disease provider suspect infected hardware from communicated fracture of the calcaneum. Mr. Hence is scheduled for CT scan this week to determine if bone has fused. There is no redness surrounding the wound today. Some areas of slough present on the base of the wound. Plan to collaborate with ID provider for antibiotic management. 05/14/18 on evaluation today patient actually appears to be doing better in regard to the overall appearance of the wound during evaluation today. With that being said we have undertaken the further testing that we had recommended here in the clinic when I first saw him. Again his CT scan which was performed show that there was a large defect in the central aspect of the calcaneus where there is no osteophyte material or bone graft identified. This measured approximately 2.5 x 2.1 cm. This is stated possibly be resort to changes or osteomyelitis again I'm unsure of exactly which we would be looking at here. Again the patient does have a prior bone culture which was obtained by myself as well during the first visit that I saw him. This revealed enterococcus as the organism in question and the pathology report again subsequently also showed nonviable bone with osteomyelitis. Again all  this couple together makes be concerned about what the  appropriate next treatment options should be at this point. The patient has seen his podiatrist last Thursday but it was felt that everything was doing "okay" and that he just need to continue with wound care. With that being said I'm somewhat leaning towards referring the patient for a second opinion in this regard with everything going on at this point. 05/21/18 on evaluation today patient actually appears to be doing about the same in regard to his ankle ulcer. I do not feel any necrotic bone or bone fragments in the wound opening I did clean this with saline and gauze as well as a Q-tip today I could not get the large head of the Q-tip in just the back in. Nonetheless he did have some bleeding which in my pinion is a good sign. Still I am somewhat concerned about the fact that he doesn't seem to be making quite as much progress as I would like to see and again I'm still concerned about the infection. He will be seeing infectious disease later this week. Electronic Signature(s) Signed: 05/22/2018 2:10:29 PM By: Worthy Keeler PA-C Entered By: Worthy Keeler on 05/22/2018 10:03:31 BROOK, MALL ([id number]) -------------------------------------------------------------------------------- Physical Exam Details Patient Name: Jose, [name]. Date of Service: 05/21/2018 9:15 AM Medical Record Number: [id number] Patient Account Number: [id number] Date of Birth/Sex: 1945-07-29 (73 y.o. M) Treating RN: Harold Barban Primary Care Provider: Golden Pop Other Clinician: Referring Provider: Golden Pop Treating Provider/Extender: STONE III, Khloe Hunkele Weeks in Treatment: 3 Constitutional Well-nourished and well-hydrated in no acute distress. Respiratory normal breathing without difficulty. clear to auscultation bilaterally. Cardiovascular regular rate and rhythm with normal S1, S2. Psychiatric this patient is able to make decisions and demonstrates good insight into disease process. Alert  and Oriented x 3. pleasant and cooperative. Notes Patient's wound bed currently shows evidence of good granulation at this time. Fortunately there does not appear to be any signs of active infection currently as far as a systemic infection is concerned. Nonetheless the patient does appear to have infection at the wound site and the bone based on prior evaluation. Electronic Signature(s) Signed: 05/22/2018 2:10:29 PM By: Worthy Keeler PA-C Entered By: Worthy Keeler on 05/22/2018 10:04:07 [name] ([id number]) -------------------------------------------------------------------------------- Physician Orders Details Patient Name: TAKUMA, CIFELLI. Date of Service: 05/21/2018 9:15 AM Medical Record Number: [id number] Patient Account Number: [id number] Date of Birth/Sex: [birth year] (73 y.o. 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 Primary Wound Dressing Wound #1 Right,Lateral Calcaneus o Silver Collagen - Apply collagen to wound bed every other day (small piece) o Iodoform packing Gauze - pack gauze on top of collagen Secondary Dressing Wound #1 Right,Lateral Calcaneus o [place] Dressing Change Frequency Wound #1 Right,Lateral Calcaneus o Change dressing every day. Follow-up Appointments Wound #1 Right,Lateral Calcaneus o Return Appointment in 1 week. Electronic Signature(s) Signed: 05/21/2018 5:03:00 PM By: Harold Barban Signed: 05/22/2018 2:10:29 PM By:  Worthy Keeler PA-C Entered By: Harold Barban on 05/21/2018 09:46:03 Houpt, Verlene Mayer ([id number]) -------------------------------------------------------------------------------- Problem List Details Patient Name: JAMUS, LOVING.  Date of Service: 05/21/2018 9:15 AM Medical Record Number: [id number] Patient Account Number: [id number] Date of Birth/Sex: 26-Dec-1945 (73 y.o. M) Treating RN: Harold Barban Primary Care Provider: Golden Pop Other Clinician: Referring Provider: Golden Pop Treating Provider/Extender: Melburn Hake, Doyal Saric Weeks in Treatment: 3 Active Problems ICD-10 Evaluated Encounter Code Description Active Date Today Diagnosis T81.31XA Disruption of external operation (surgical) wound, not 04/30/2018 No Yes elsewhere classified, initial encounter L97.414 Non-pressure chronic ulcer of right heel and midfoot with 04/30/2018 No Yes necrosis of bone Inactive Problems Resolved Problems Electronic Signature(s) Signed: 05/22/2018 2:10:29 PM By: Worthy Keeler PA-C Entered By: Worthy Keeler on 05/21/2018 09:33:48 [place], Verlene Mayer ([id number]) -------------------------------------------------------------------------------- Progress Note Details Patient Name: KO, BARDON. Date of Service: 05/21/2018 9:15 AM Medical Record Number: [id number] Patient Account Number: [id number] Date of Birth/Sex: 1945-05-03 (73 y.o. M) Treating RN: Harold Barban Primary Care Provider: Golden Pop Other Clinician: Referring Provider: Golden Pop Treating Provider/Extender: Melburn Hake, Lennix Rotundo Weeks in Treatment: 3 Subjective Chief Complaint Information obtained from Patient Right heel surgical ulcer History of Present Illness (HPI) 06/29/18 on evaluation today patient presents for initial evaluation our clinic for an issue that actually began December 19, 2017. He was actually climbing a ladder with the latter gateway causing him to fall he sustained a significant fracture to  his right ankle requiring open reduction internal fixation of the ankle. The patient is an avid biker and not only does he ride bikes but he also builds. It has been very difficult for him to be off of his bicycle during the four months he has been attempting to heal since the surgery. Unfortunately the reason he comes to me today is that he still has an area that just will not close along the surgical incision site that he requested a second opinion regarding with wound care from his podiatrist. The patient really has no significant medical history and even for his age appears to be excellent shape in my pinion. No fevers, chills, nausea, or vomiting noted at this time. 05/07/2018 Seen today for follow up and management right foot wound s/p mechanical fall from ladder. Prior to wound culture from wound center, he was being treated with penicillin 500 mg every 6 hours for group B strep of the wound and later changed to Augmentin 875 mg PO BID. On last wound care visit bone culture was obtained and it was positive for enterococcus faecalis and nonviable bone with Osteomyelitis of the left calcaneum. Infectious disease provider suspect infected hardware from communicated fracture of the calcaneum. Mr. Adamec is scheduled for CT scan this week to determine if bone has fused. There is no redness surrounding the wound today. Some areas of slough present on the base of the wound. Plan to collaborate with ID provider for antibiotic management. 05/14/18 on evaluation today patient actually appears to be doing better in regard to the overall appearance of the wound during evaluation today. With that being said we have undertaken the further testing that we had recommended here in the clinic when I first saw him. Again his CT scan which was performed show that there was a large defect in the central aspect of the calcaneus where there is no osteophyte material or bone graft identified. This measured approximately  2.5 x 2.1 cm. This is stated possibly be resort to changes or osteomyelitis again I'm unsure of exactly which we would be looking at here. Again the patient does have a prior bone culture which was obtained by myself as  well during the first visit that I saw him. This revealed enterococcus as the organism in question and the pathology report again subsequently also showed nonviable bone with osteomyelitis. Again all this couple together makes be concerned about what the appropriate next treatment options should be at this point. The patient has seen his podiatrist last Thursday but it was felt that everything was doing "okay" and that he just need to continue with wound care. With that being said I'm somewhat leaning towards referring the patient for a second opinion in this regard with everything going on at this point. 05/21/18 on evaluation today patient actually appears to be doing about the same in regard to his ankle ulcer. I do not feel any necrotic bone or bone fragments in the wound opening I did clean this with saline and gauze as well as a Q-tip today I could not get the large head of the Q-tip in just the back in. Nonetheless he did have some bleeding which in my pinion is a good sign. Still I am somewhat concerned about the fact that he doesn't seem to be making quite as much progress as I would like to see and again I'm still concerned about the infection. He will be seeing infectious disease later this week. Patient History Information obtained from Patient. Family History Cancer - Mother, Heart Disease - Mother, Hypertension - Mother, MIKOLAJ, WOOLSTENHULME ([id number]) No family history of Diabetes, Hereditary Spherocytosis, Kidney Disease, Lung Disease, Seizures, Stroke, Thyroid Problems, Tuberculosis. Social History Never smoker, Marital Status - Married, Alcohol Use - Never, Drug Use - No History, Caffeine Use - Daily. Medical History Eyes Denies history of Cataracts, Glaucoma,  Optic Neuritis Ear/Nose/Mouth/Throat Denies history of Chronic sinus problems/congestion, Middle ear problems Hematologic/Lymphatic Denies history of Anemia, Hemophilia, Human Immunodeficiency Virus, Lymphedema, Sickle Cell Disease Respiratory Denies history of Aspiration, Asthma, Chronic Obstructive Pulmonary Disease (COPD), Pneumothorax, Sleep Apnea, Tuberculosis Cardiovascular Denies history of Angina, Arrhythmia, Congestive Heart Failure, Coronary Artery Disease, Deep Vein Thrombosis, Hypertension, Hypotension, Myocardial Infarction, Peripheral Arterial Disease, Peripheral Venous Disease, Phlebitis, Vasculitis Gastrointestinal Denies history of Cirrhosis , Colitis, Crohn s, Hepatitis A, Hepatitis B, Hepatitis C Endocrine Denies history of Type I Diabetes, Type II Diabetes Genitourinary Denies history of End Stage Renal Disease Immunological Denies history of Lupus Erythematosus, Raynaud s, Scleroderma Integumentary (Skin) Denies history of History of Burn, History of pressure wounds Musculoskeletal Denies history of Gout, Rheumatoid Arthritis, Osteoarthritis, Osteomyelitis Neurologic Denies history of Dementia, Neuropathy, Quadriplegia, Paraplegia, Seizure Disorder Oncologic Denies history of Received Chemotherapy, Received Radiation Psychiatric Denies history of Anorexia/bulimia, Confinement Anxiety Medical And Surgical History Notes Genitourinary BPH Review of Systems (ROS) Constitutional Symptoms (General Health) Denies complaints or symptoms of Fever, Chills. Respiratory The patient has no complaints or symptoms. Cardiovascular The patient has no complaints or symptoms. Psychiatric The patient has no complaints or symptoms. BERTHEL, BAGNALL ([id number]) Objective Constitutional Well-nourished and well-hydrated in no acute distress. Vitals Time Taken: 9:20 AM, Height: 67 in, Weight: 148.1 lbs, BMI: 23.2, Temperature: 97.8 F, Pulse: 69 bpm, Respiratory Rate: 16  breaths/min, Blood Pressure: 130/70 mmHg. Respiratory normal breathing without difficulty. clear to auscultation bilaterally. Cardiovascular regular rate and rhythm with normal S1, S2. Psychiatric this patient is able to make decisions and demonstrates good insight into disease process. Alert and Oriented x 3. pleasant and cooperative. General Notes: Patient's wound bed currently shows evidence of good granulation at this time. Fortunately there does not appear to be any signs of active infection currently as far as  a systemic infection is concerned. Nonetheless the patient does appear to have infection at the wound site and the bone based on prior evaluation. Integumentary (Hair, Skin) Wound #1 status is Open. Original cause of wound was Surgical Injury. The wound is located on the Right,Lateral Calcaneus. The wound measures 0.3cm length x 0.4cm width x 0.8cm depth; 0.094cm^2 area and 0.075cm^3 volume. There is Fat Layer (Subcutaneous Tissue) Exposed exposed. There is no tunneling or undermining noted. There is a medium amount of purulent drainage noted. The wound margin is flat and intact. There is medium (34-66%) pink granulation within the wound bed. There is a medium (34-66%) amount of necrotic tissue within the wound bed including Adherent Slough. The periwound skin appearance exhibited: Scarring, Maceration. The periwound skin appearance did not exhibit: Callus, Crepitus, Excoriation, Induration, Rash, Dry/Scaly, Atrophie Blanche, Cyanosis, Ecchymosis, Hemosiderin Staining, Mottled, Pallor, Rubor, Erythema. Periwound temperature was noted as No Abnormality. The periwound has tenderness on palpation. 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 Primary Wound Dressing: Wound #1 Right,Lateral Calcaneus: BERYL, BALZ ([id number]) Silver Collagen - Apply collagen to wound bed every other day (small piece) Iodoform packing Gauze - pack gauze on top of collagen Secondary Dressing: Wound #1 Right,Lateral Calcaneus: [place] Change Frequency: Wound #1 Right,Lateral Calcaneus: Change dressing every day. Follow-up Appointments: Wound #1 Right,Lateral Calcaneus: Return Appointment in 1 week. My suggestion currently is gonna be that we continue with the above wound care measures for the next week patients in agreement with plan. Subsequently we will see were things stand at follow-up. Anything changes or worsens meantime will contact the office let me know otherwise will see were things stand at that point. Please see above for specific wound care orders. We will see patient for re-evaluation in 1 week(s) here in the clinic. If anything worsens or changes patient will contact our office for additional recommendations. Electronic Signature(s) Signed: 05/22/2018 2:10:29 PM By: Worthy Keeler PA-C Entered By: Worthy Keeler on 05/22/2018 10:04:22 [name] ([id number]) -------------------------------------------------------------------------------- ROS/PFSH Details Patient Name: SABRE, LEONETTI. Date of Service: 05/21/2018 9:15 AM Medical Record Number: [id number] Patient Account Number: [id number] Date of Birth/Sex: 07-23-1945 (73 y.o. M) Treating RN: Harold Barban Primary Care Provider: Golden Pop Other Clinician: Referring Provider: Golden Pop Treating Provider/Extender: Melburn Hake, Lanyah Spengler Weeks in Treatment: 3 Information Obtained From Patient Wound History Do you currently have one or more open woundso Yes How many open wounds do you currently haveo 1 Approximately how long have you had your woundso 4 months How have you been treating your wound(s) until nowo packing strip Has  your wound(s) ever healed and then re-openedo No Have you had any lab work done in the past montho No Have you tested positive for an antibiotic resistant organism (MRSA, VRE)o No Have you tested positive for osteomyelitis (bone infection)o No Have you had any tests for circulation on your legso No Constitutional Symptoms (General Health) Complaints and Symptoms: Negative for: Fever; Chills Eyes Medical History: Negative for: Cataracts; Glaucoma; Optic Neuritis Ear/Nose/Mouth/Throat Medical History: Negative for: Chronic sinus problems/congestion; Middle ear problems Hematologic/Lymphatic Medical History: Negative for: Anemia; Hemophilia; Human Immunodeficiency Virus; Lymphedema; Sickle Cell Disease Respiratory Complaints and Symptoms: No Complaints or Symptoms Medical History: Negative for: Aspiration; Asthma; Chronic Obstructive Pulmonary Disease (COPD); Pneumothorax; Sleep Apnea; Tuberculosis  Cardiovascular Complaints and Symptoms: No Complaints or Symptoms Medical History: Negative for: Angina; Arrhythmia; Congestive Heart Failure; Coronary Artery Disease; Deep Vein Thrombosis; ANDRUS, SHARP K. ([id number]) Hypertension; Hypotension; Myocardial Infarction; Peripheral Arterial Disease; Peripheral Venous Disease; Phlebitis; Vasculitis Gastrointestinal Medical History: Negative for: Cirrhosis ; Colitis; Crohnos; Hepatitis A; Hepatitis B; Hepatitis C Endocrine Medical History: Negative for: Type I Diabetes; Type II Diabetes Genitourinary Medical History: Negative for: End Stage Renal Disease Past Medical History Notes: BPH Immunological Medical History: Negative for: Lupus Erythematosus; Raynaudos; Scleroderma Integumentary (Skin) Medical History: Negative for: History of Burn; History of pressure wounds Musculoskeletal Medical History: Negative for: Gout; Rheumatoid Arthritis; Osteoarthritis; Osteomyelitis Neurologic Medical History: Negative for: Dementia;  Neuropathy; Quadriplegia; Paraplegia; Seizure Disorder Oncologic Medical History: Negative for: Received Chemotherapy; Received Radiation Psychiatric Complaints and Symptoms: No Complaints or Symptoms Medical History: Negative for: Anorexia/bulimia; Confinement Anxiety Immunizations Pneumococcal Vaccine: Received Pneumococcal Vaccination: Yes Immunization Notes: up to date HELTON, OLESON ([id number]) Implantable Devices None Family and Social History Cancer: Yes - Mother; Diabetes: No; Heart Disease: Yes - Mother; Hereditary Spherocytosis: No; Hypertension: Yes - Mother; Kidney Disease: No; Lung Disease: No; Seizures: No; Stroke: No; Thyroid Problems: No; Tuberculosis: No; Never smoker; Marital Status - Married; Alcohol Use: Never; Drug Use: No History; Caffeine Use: Daily; Financial Concerns: No; Food, Clothing or Shelter Needs: No; Support System Lacking: No; Transportation Concerns: No; Advanced Directives: No; Patient does not want information on Advanced Directives Physician Affirmation I have reviewed and agree with the above information. Electronic Signature(s) Signed: 05/22/2018 2:10:29 PM By: Worthy Keeler PA-C Signed: 05/22/2018 4:48:10 PM By: Harold Barban Entered By: Worthy Keeler on 05/22/2018 10:03:42 [name] ([id number]) -------------------------------------------------------------------------------- SuperBill Details Patient Name: AYYAN, SITES. Date of Service: 05/21/2018 Medical Record Number: [id number] Patient Account Number: [id number] Date of Birth/Sex: 08-02-1945 (73 y.o. M) Treating RN: Harold Barban Primary Care Provider: Golden Pop Other Clinician: Referring Provider: Golden Pop Treating Provider/Extender: Melburn Hake, Arnice Vanepps Weeks in Treatment: 3 Diagnosis Coding ICD-10 Codes Code Description T81.31XA Disruption of external operation (surgical) wound, not elsewhere classified, initial encounter L97.414 Non-pressure chronic  ulcer of right heel and midfoot with necrosis of bone Facility Procedures CPT4 Code: [id number] Description: 54492 - WOUND CARE VISIT-LEV 2 EST PT Modifier: Quantity: 1 Physician Procedures CPT4: Description Modifier Quantity Code 0100712 99214 - WC PHYS LEVEL 4 - EST PT 1 ICD-10 Diagnosis Description T81.31XA Disruption of external operation (surgical) wound, not elsewhere classified, initial encounter L97.414 Non-pressure chronic ulcer of  right heel and midfoot with necrosis of bone Electronic Signature(s) Signed: 05/22/2018 2:10:29 PM By: Worthy Keeler PA-C Entered By: Worthy Keeler on 05/22/2018 10:05:02

## 2018-05-24 DIAGNOSIS — M25674 Stiffness of right foot, not elsewhere classified: Secondary | ICD-10-CM | POA: Diagnosis not present

## 2018-05-24 DIAGNOSIS — M79671 Pain in right foot: Secondary | ICD-10-CM | POA: Diagnosis not present

## 2018-05-28 ENCOUNTER — Telehealth: Payer: Self-pay | Admitting: Infectious Diseases

## 2018-05-28 ENCOUNTER — Ambulatory Visit: Payer: PPO | Admitting: Physician Assistant

## 2018-05-28 NOTE — Telephone Encounter (Signed)
ID Spoke to patient and he is doing well on oral augmentin- he says the rt foot wound/hole is doing wll and closing up Also on reviewing the films there eis no obvious involvement of the hardware by the sinus tract. He had enterococcus and Eikenella in the culture and Augmentin covers them. He will continue Augmentin for 2-3 months

## 2018-05-31 DIAGNOSIS — M25674 Stiffness of right foot, not elsewhere classified: Secondary | ICD-10-CM | POA: Diagnosis not present

## 2018-05-31 DIAGNOSIS — M79671 Pain in right foot: Secondary | ICD-10-CM | POA: Diagnosis not present

## 2018-06-04 ENCOUNTER — Encounter: Payer: PPO | Admitting: Physician Assistant

## 2018-06-04 ENCOUNTER — Other Ambulatory Visit: Payer: Self-pay

## 2018-06-04 DIAGNOSIS — T8189XA Other complications of procedures, not elsewhere classified, initial encounter: Secondary | ICD-10-CM | POA: Diagnosis not present

## 2018-06-04 DIAGNOSIS — T8133XA Disruption of traumatic injury wound repair, initial encounter: Secondary | ICD-10-CM | POA: Diagnosis not present

## 2018-06-05 NOTE — Progress Notes (Addendum)
Jose [name] ([id number]) Visit Report for 06/04/2018 Arrival Information Details Patient Name: Jose [name], Jose [name]. Date of Service: 06/04/2018 10:30 AM Medical Record Number: [id number] Patient Account Number: [id number] Date of Birth/Sex: 09-Sep-1945 (73 y.o. M) Treating [name]: Jose [name] Primary Care Jose [name]: Jose [name] Other Clinician: Referring Jose [name]: Jose [name] Treating Jose [name]/Extender: Jose [name], Jose [name]: 5 Visit Information History Since Last Visit Added or deleted any medications: No Patient Arrived: Ambulatory Any new allergies or adverse reactions: No Arrival Time: 10:25 Had a fall or experienced change in No Accompanied By: self activities of daily living that may affect Transfer Assistance: None risk of falls: Patient Identification Verified: Yes Signs or symptoms of abuse/neglect since last visito No Secondary Verification Process Completed: Yes Hospitalized since last visit: No Implantable device outside of the clinic excluding No cellular tissue based products placed in the center since last visit: Has Dressing in Place as Prescribed: Yes Pain Present Now: No Electronic Signature(s) Signed: 06/04/2018 4:10:47 PM By: Jose [name], BSN, [name], CWS, Jose [name], BSN Entered By: Jose [name], BSN, [name], CWS, Jose on 06/04/2018 10:25:45 Jose [name] ([id number]) -------------------------------------------------------------------------------- Clinic Level of Care Assessment Details Patient Name: Jose [name], Jose [name]. Date of Service: 06/04/2018 10:30 AM Medical Record Number: [id number] Patient Account Number: [id number] Date of Birth/Sex: 09-21-1945 (73 y.o. M) Treating [name]: Jose [name] Primary Care Jose [name]: Jose [name] Other Clinician: Referring Jose [name]: Jose [name] Treating Jose [name]/Extender: Jose [name], Jose [name]: 5 Clinic Level of Care Assessment Items TOOL 4 Quantity Score []  - Use when only an EandM is performed on FOLLOW-UP visit  0 ASSESSMENTS - Nursing Assessment / Reassessment X - Reassessment of Co-morbidities (includes updates in patient status) 1 10 X- 1 5 Reassessment of Adherence to [name] Plan ASSESSMENTS - Wound and Skin Assessment / Reassessment X - Simple Wound Assessment / Reassessment - one wound 1 5 []  - 0 Complex Wound Assessment / Reassessment - multiple wounds []  - 0 Dermatologic / Skin Assessment (not related to wound area) ASSESSMENTS - Focused Assessment []  - Circumferential Edema Measurements - multi extremities 0 []  - 0 Nutritional Assessment / Counseling / Intervention []  - 0 Lower Extremity Assessment (monofilament, tuning fork, pulses) []  - 0 Peripheral Arterial Disease Assessment (using hand held doppler) ASSESSMENTS - Ostomy and/or Continence Assessment and Care []  - Incontinence Assessment and Management 0 []  - 0 Ostomy Care Assessment and Management (repouching, etc.) PROCESS - Coordination of Care X - Simple Patient / Family Education for ongoing care 1 15 []  - 0 Complex (extensive) Patient / Family Education for ongoing care []  - 0 Staff obtains Programmer, systems, Records, Test Results / Process Orders []  - 0 Staff telephones HHA, Nursing Homes / Clarify orders / etc []  - 0 Routine Transfer to another Facility (non-emergent condition) []  - 0 Routine Hospital Admission (non-emergent condition) []  - 0 New Admissions / Biomedical engineer / Ordering NPWT, Apligraf, etc. []  - 0 Emergency Hospital Admission (emergent condition) X- 1 10 Simple Discharge Coordination Jose [name], Jose [name]. ([id number]) []  - 0 Complex (extensive) Discharge Coordination PROCESS - Special Needs []  - Pediatric / Minor Patient Management 0 []  - 0 Isolation Patient Management []  - 0 Hearing / Language / Visual special needs []  - 0 Assessment of Community assistance (transportation, D/C planning, etc.) []  - 0 Additional assistance / Altered mentation []  - 0 Support Surface(s) Assessment (bed,  cushion, seat, etc.) INTERVENTIONS - Wound Cleansing / Measurement X - Simple Wound Cleansing - one wound 1 5 []  - 0 Complex Wound Cleansing -  multiple wounds X- 1 5 Wound Imaging (photographs - any number of wounds) []  - 0 Wound Tracing (instead of photographs) X- 1 5 Simple Wound Measurement - one wound []  - 0 Complex Wound Measurement - multiple wounds INTERVENTIONS - Wound Dressings X - Small Wound Dressing one or multiple wounds 1 10 []  - 0 Medium Wound Dressing one or multiple wounds []  - 0 Large Wound Dressing one or multiple wounds []  - 0 Application of Medications - topical []  - 0 Application of Medications - injection INTERVENTIONS - Miscellaneous []  - External ear exam 0 []  - 0 Specimen Collection (cultures, biopsies, blood, body fluids, etc.) []  - 0 Specimen(s) / Culture(s) sent or taken to Lab for analysis []  - 0 Patient Transfer (multiple staff / Civil Service fast streamer / Similar devices) []  - 0 Simple Staple / Suture removal (25 or less) []  - 0 Complex Staple / Suture removal (26 or more) []  - 0 Hypo / Hyperglycemic Management (close monitor of Blood Glucose) []  - 0 Ankle / Brachial Index (ABI) - do not check if billed separately X- 1 5 Vital Signs Jose [name], [name] ([id number]) Has the patient been seen at the hospital within the last three years: Yes Total Score: 75 Level Of Care: New/Established - Level 2 Electronic Signature(s) Signed: 06/04/2018 3:00:04 PM By: Jose [name] Entered By: Jose [name] on 06/04/2018 11:08:20 Jose [name] ([id number]) -------------------------------------------------------------------------------- Encounter Discharge Information Details Patient Name: Jose [name]. Date of Service: 06/04/2018 10:30 AM Medical Record Number: [id number] Patient Account Number: [id number] Date of Birth/Sex: [birth year] (73 y.o. M) Treating [name]: Jose [name] Primary Care Jenessa Gillingham: Jose [name] Other Clinician: Referring Jabar Krysiak:  Jose [name] Treating Cynthea Zachman/Extender: Jose [name], Jose [name]: 5 Encounter Discharge Information Items Discharge Condition: Stable Ambulatory Status: Ambulatory Discharge Destination: Home Transportation: Private Auto Accompanied By: self Schedule Follow-up Appointment: Yes Clinical Summary of Care: Electronic Signature(s) Signed: 06/04/2018 11:22:09 AM By: Jose [name] Entered By: Jose [name] on 06/04/2018 11:18:36 Jose [name] ([id number]) -------------------------------------------------------------------------------- Lower Extremity Assessment Details Patient Name: Jose [name], Jose [name]. Date of Service: 06/04/2018 10:30 AM Medical Record Number: [id number] Patient Account Number: [id number] Date of Birth/Sex: 1945-11-10 (73 y.o. M) Treating [name]: Jose [name] Primary Care Arien Benincasa: Jose [name] Other Clinician: Referring Reet Scharrer: Jose [name] Treating Zhoe Catania/Extender: Jose [name], Jose [name]: 5 Vascular Assessment Pulses: Dorsalis Pedis Palpable: [Right:Yes] Extremity colors, hair growth, and conditions: Extremity Color: [Right:Normal] Temperature of Extremity: [Right:Warm < 3 seconds] Toe Nail Assessment Left: Right: Thick: No Discolored: No Deformed: No Improper Length and Hygiene: No Electronic Signature(s) Signed: 06/04/2018 4:10:47 PM By: Jose [name], BSN, [name], CWS, Jose [name], BSN Entered By: Jose [name], BSN, [name], CWS, Jose on 06/04/2018 10:31:18 Jose [name] ([id number]) -------------------------------------------------------------------------------- Multi Wound Chart Details Patient Name: Jose [name], Jose [name]. Date of Service: 06/04/2018 10:30 AM Medical Record Number: [id number] Patient Account Number: [id number] Date of Birth/Sex: [birth year] (73 y.o. M) Treating [name]: Jose [name] Primary Care Aza Dantes: Jose [name] Other Clinician: Referring Mirna Sutcliffe: Jose [name] Treating Bralon Antkowiak/Extender: STONE III, Jose [name]: 5 Vital  Signs Height(in): 67 Pulse(bpm): 68 Weight(lbs): 148.1 Blood Pressure(mmHg): 136/68 Body Mass Index(BMI): 23 Temperature(F): 98.2 Respiratory Rate 16 (breaths/min): Photos: [N/A:N/A] Wound Location: Right Calcaneus - Lateral N/A N/A Wounding Event: Surgical Injury N/A N/A Primary Etiology: Open Surgical Wound N/A N/A Date Acquired: 12/29/2017 N/A N/A Weeks of [name]: 5 N/A N/A Wound Status: Open N/A N/A Measurements L x W x D 0.2x0.3x0.6 N/A N/A (cm) Area (cm) : 0.047 N/A N/A Volume (cm) : 0.028 N/A  N/A % Reduction in Area: 80.10% N/A N/A % Reduction in Volume: 90.10% N/A N/A Classification: Full Thickness Without N/A N/A Exposed Support Structures Exudate Amount: Medium N/A N/A Exudate Type: Serous N/A N/A Exudate Color: amber N/A N/A Wound Margin: Flat and Intact N/A N/A Granulation Amount: Large (67-100%) N/A N/A Granulation Quality: Pink N/A N/A Necrotic Amount: Small (1-33%) N/A N/A Exposed Structures: Fat Layer (Subcutaneous N/A N/A Tissue) Exposed: Yes Fascia: No Tendon: No Muscle: No Joint: No Bone: No Epithelialization: None N/A N/A Jose [name], Jose [name]. ([id number]) Periwound Skin Texture: Scarring: Yes N/A N/A Excoriation: No Induration: No Callus: No Crepitus: No Rash: No Periwound Skin Moisture: Maceration: Yes N/A N/A Dry/Scaly: No Periwound Skin Color: Atrophie Blanche: No N/A N/A Cyanosis: No Ecchymosis: No Erythema: No Hemosiderin Staining: No Mottled: No Pallor: No Rubor: No Temperature: No Abnormality N/A N/A Tenderness on Palpation: Yes N/A N/A Wound Preparation: Ulcer Cleansing: N/A N/A Rinsed/Irrigated with Saline Topical Anesthetic Applied: Other: lidocaine 4% [name] Notes Electronic Signature(s) Signed: 06/04/2018 3:00:04 PM By: Jose [name] Entered By: Jose [name] on 06/04/2018 11:07:20 Jose [name]  ([id number]) -------------------------------------------------------------------------------- [place] Details Patient Name: Jose [name], Jose [name]. Date of Service: 06/04/2018 10:30 AM Medical Record Number: [id number] Patient Account Number: [id number] Date of Birth/Sex: 03-30-1945 (73 y.o. M) Treating [name]: Jose [name] Primary Care Gabryella Murfin: Jose [name] Other Clinician: Referring Treyvone Chelf: Jose [name] Treating Wilfredo Canterbury/Extender: Jose [name], Jose [name]: 5 Active Inactive Wound/Skin Impairment Nursing Diagnoses: Impaired tissue integrity Knowledge deficit related to ulceration/compromised skin integrity Goals: Ulcer/skin breakdown will have a volume reduction of 30% by week 4 Date Initiated: 04/30/2018 Target Resolution Date: 05/29/2018 Goal Status: Active Interventions: Assess patient/caregiver ability to obtain necessary supplies Assess patient/caregiver ability to perform ulcer/skin care regimen upon admission and as needed Assess ulceration(s) every visit Provide education on ulcer and skin care Notes: Electronic Signature(s) Signed: 06/04/2018 3:00:04 PM By: Jose [name] Entered By: Jose [name] on 06/04/2018 11:07:11 Jose [name] ([id number]) -------------------------------------------------------------------------------- Pain Assessment Details Patient Name: Jose [name], Jose [name]. Date of Service: 06/04/2018 10:30 AM Medical Record Number: [id number] Patient Account Number: [id number] Date of Birth/Sex: 1945/06/30 (73 y.o. M) Treating [name]: Jose [name] Primary Care Mitsue Peery: Jose [name] Other Clinician: Referring Kerem Gilmer: Jose [name] Treating Jannine Abreu/Extender: Jose [name], Jose [name]: 5 Active Problems Location of Pain Severity and Description of Pain Patient Has Paino No Site Locations Pain Management and Medication Current Pain Management: Notes patient denies any pain at this time. Electronic  Signature(s) Signed: 06/04/2018 4:10:47 PM By: Jose [name], BSN, [name], CWS, Jose [name], BSN Entered By: Jose [name], BSN, [name], CWS, Jose on 06/04/2018 10:26:23 Jose [name] ([id number]) -------------------------------------------------------------------------------- Patient/Caregiver Education Details Patient Name: Jose [name], Jose [name]. Date of Service: 06/04/2018 10:30 AM Medical Record Number: [id number] Patient Account Number: [id number] Date of Birth/Gender: 1945/03/20 (73 y.o. M) Treating [name]: Jose [name] Primary Care Physician: Jose [name] Other Clinician: Referring Physician: Golden [name] Treating Physician/Extender: Sharalyn Ink in [name]: 5 Education Assessment Education Provided To: Patient Education Topics Provided Wound/Skin Impairment: Handouts: Caring for Your Ulcer Methods: Demonstration, Explain/Verbal Responses: State content correctly Electronic Signature(s) Signed: 06/04/2018 3:00:04 PM By: Jose [name] Entered By: Jose [name] on 06/04/2018 11:07:38 Jose [name] ([id number]) -------------------------------------------------------------------------------- Wound Assessment Details Patient Name: Jose [name], Jose [name]. Date of Service: 06/04/2018 10:30 AM Medical Record Number: [id number] Patient Account Number: [id number] Date of Birth/Sex: 07-09-1945 (73 y.o. M) Treating [name]: Jose [name] Primary Care Charliegh Vasudevan: Jose [name] Other Clinician: Referring Derris Millan: Jose [name] Treating Crissie Aloi/Extender: Jose [name], Jose [name]:  5 Wound Status Wound Number: 1 Primary Etiology: Open Surgical Wound Wound Location: Right Calcaneus - Lateral Wound Status: Open Wounding Event: Surgical Injury Date Acquired: 12/29/2017 Weeks Of [name]: 5 Clustered Wound: No Photos Photo Uploaded By: Jose [name], BSN, [name], CWS, Jose on 06/04/2018 10:30:31 Wound Measurements Length: (cm) 0.2 Width: (cm) 0.3 Depth: (cm) 0.6 Area: (cm) 0.047 Volume: (cm) 0.028 %  Reduction in Area: 80.1% % Reduction in Volume: 90.1% Epithelialization: None Tunneling: No Undermining: No Wound Description Full Thickness Without Exposed Support Foul Odo Classification: Structures Slough/F Wound Margin: Flat and Intact Exudate Medium Amount: Exudate Type: Serous Exudate Color: amber r After Cleansing: No ibrino Yes Wound Bed Granulation Amount: Large (67-100%) Exposed Structure Granulation Quality: Pink Fascia Exposed: No Necrotic Amount: Small (1-33%) Fat Layer (Subcutaneous Tissue) Exposed: Yes Necrotic Quality: Adherent Slough Tendon Exposed: No Muscle Exposed: No Joint Exposed: No Bone Exposed: No Jose [name], MASH. ([id number]) Periwound Skin Texture Texture Color No Abnormalities Noted: No No Abnormalities Noted: No Callus: No Atrophie Blanche: No Crepitus: No Cyanosis: No Excoriation: No Ecchymosis: No Induration: No Erythema: No Rash: No Hemosiderin Staining: No Scarring: Yes Mottled: No Pallor: No Moisture Rubor: No No Abnormalities Noted: No Dry / Scaly: No Temperature / Pain Maceration: Yes Temperature: No Abnormality Tenderness on Palpation: Yes Wound Preparation Ulcer Cleansing: Rinsed/Irrigated with Saline Topical Anesthetic Applied: Other: lidocaine 4%, [name] Notes Wound #1 (Right, Lateral Calcaneus) Notes prisma, Iodoform packing gauze, drawtex, telfa Manufacturing systems engineer) Signed: 06/04/2018 4:10:47 PM By: Jose [name], BSN, [name], CWS, Jose [name], BSN Entered By: Jose [name], BSN, [name], CWS, Jose on 06/04/2018 10:29:42 Jose [name] ([id number]) -------------------------------------------------------------------------------- Vitals Details Patient Name: Jose [name], Jose [name]. Date of Service: 06/04/2018 10:30 AM Medical Record Number: [id number] Patient Account Number: [id number] Date of Birth/Sex: [birth year] (73 y.o. M) Treating [name]: Jose [name] Primary Care Arali Somera: Jose [name] Other Clinician: Referring Quintrell Baze:  Jose [name] Treating Kerem Gilmer/Extender: Jose [name], Jose [name]: 5 Vital Signs Time Taken: 10:26 Temperature (F): 98.2 Height (in): 67 Pulse (bpm): 68 Weight (lbs): 148.1 Respiratory Rate (breaths/min): 16 Body Mass Index (BMI): 23.2 Blood Pressure (mmHg): 136/68 Reference Range: 80 - 120 mg / dl Electronic Signature(s) Signed: 06/04/2018 4:10:47 PM By: Jose [name], BSN, [name], CWS, Jose [name], BSN Entered By: Jose [name], BSN, [name], CWS, Jose on 06/04/2018 03:70:48

## 2018-06-05 NOTE — Progress Notes (Signed)
Jose [name], Jose [name] ([id number]) Visit Report for 06/04/2018 Chief Complaint Document Details Patient Name: Jose [name], Jose [name]. Date of Service: 06/04/2018 10:30 AM Medical Record Number: [id number] Patient Account Number: [id number] Date of Birth/Sex: 1945/07/06 (72 y.o. M) Treating RN: Harold Barban Primary Care Provider: Golden Pop Other Clinician: Referring Provider: Golden Pop Treating Provider/Extender: Melburn Hake, HOYT Weeks in Treatment: 5 Information Obtained from: Patient Chief Complaint Right heel surgical ulcer Electronic Signature(s) Signed: 06/05/2018 8:30:49 AM By: Worthy Keeler PA-C Entered By: Worthy Keeler on 06/05/2018 08:24:10 Jose [name], Jose [name] ([id number]) -------------------------------------------------------------------------------- HPI Details Patient Name: Jose [name], Jose [name]. Date of Service: 06/04/2018 10:30 AM Medical Record Number: [id number] Patient Account Number: [id number] Date of Birth/Sex: 01-29-1946 (73 y.o. M) Treating RN: Harold Barban Primary Care Provider: Golden Pop Other Clinician: Referring Provider: Golden Pop Treating Provider/Extender: Melburn Hake, HOYT Weeks in Treatment: 5 History of Present Illness HPI Description: 06/29/18 on evaluation today patient presents for initial evaluation our clinic for an issue that actually began December 19, 2017. He was actually climbing a ladder with the latter gateway causing him to fall he sustained a significant fracture to his right ankle requiring open reduction internal fixation of the ankle. The patient is an avid biker and not only does he ride bikes but he also builds. It has been very difficult for him to be off of his bicycle during the four months he has been attempting to heal since the surgery. Unfortunately the reason he comes to me today is that he still has an area that just will not close along the surgical incision site that he requested a second opinion regarding with wound  care from his podiatrist. The patient really has no significant medical history and even for his age appears to be excellent shape in my pinion. No fevers, chills, nausea, or vomiting noted at this time. 05/07/2018 Seen today for follow up and management right foot wound s/p mechanical fall from ladder. Prior to wound culture from wound center, he was being treated with penicillin 500 mg every 6 hours for group B strep of the wound and later changed to Augmentin 875 mg PO BID. On last wound care visit bone culture was obtained and it was positive for enterococcus faecalis and nonviable bone with Osteomyelitis of the left calcaneum. Infectious disease provider suspect infected hardware from communicated fracture of the calcaneum. Mr. Mentink is scheduled for CT scan this week to determine if bone has fused. There is no redness surrounding the wound today. Some areas of slough present on the base of the wound. Plan to collaborate with ID provider for antibiotic management. 05/14/18 on evaluation today patient actually appears to be doing better in regard to the overall appearance of the wound during evaluation today. With that being said we have undertaken the further testing that we had recommended here in the clinic when I first saw him. Again his CT scan which was performed show that there was a large defect in the central aspect of the calcaneus where there is no osteophyte material or bone graft identified. This measured approximately 2.5 x 2.1 cm. This is stated possibly be resort to changes or osteomyelitis again I'm unsure of exactly which we would be looking at here. Again the patient does have a prior bone culture which was obtained by myself as well during the first visit that I saw him. This revealed enterococcus as the organism in question and the pathology report again subsequently also showed nonviable bone with osteomyelitis. Again all  this couple together makes be concerned about what the  appropriate next treatment options should be at this point. The patient has seen his podiatrist last Thursday but it was felt that everything was doing "okay" and that he just need to continue with wound care. With that being said I'm somewhat leaning towards referring the patient for a second opinion in this regard with everything going on at this point. 05/21/18 on evaluation today patient actually appears to be doing about the same in regard to his ankle ulcer. I do not feel any necrotic bone or bone fragments in the wound opening I did clean this with saline and gauze as well as a Q-tip today I could not get the large head of the Q-tip in just the back in. Nonetheless he did have some bleeding which in my pinion is a good sign. Still I am somewhat concerned about the fact that he doesn't seem to be making quite as much progress as I would like to see and again I'm still concerned about the infection. He will be seeing infectious disease later this week. 06/04/18 on evaluation today patient presents for follow-up after his evaluation with the orthopedic specialist. Fortunately he seems to be doing very well in regard to the overall appearance of the wound upon evaluation and inspection today. I'm very pleased in this regard. With that being said I do not see any evidence of signs of active infection or lease worsening infection currently. He is still on the Augmentin currently. Overall the patient's pleased with how things seem to be progressing. So far there is no evidence of a surgical intervention that's been recommended for him based on what I'm hearing. Electronic Signature(s) Signed: 06/05/2018 8:30:49 AM By: Worthy Keeler PA-C Entered By: Worthy Keeler on 06/05/2018 08:24:17 Jose [name], Jose [name] ([id number], Jose [name] ([id number]) -------------------------------------------------------------------------------- Physical Exam Details Patient Name: Jose [name], Jose [name]. Date of Service:  06/04/2018 10:30 AM Medical Record Number: [id number] Patient Account Number: [id number] Date of Birth/Sex: 06/02/1945 (73 y.o. M) Treating RN: Harold Barban Primary Care Provider: Golden Pop Other Clinician: Referring Provider: Golden Pop Treating Provider/Extender: STONE III, HOYT Weeks in Treatment: 5 Constitutional Well-nourished and well-hydrated in no acute distress. Respiratory normal breathing without difficulty. clear to auscultation bilaterally. Cardiovascular regular rate and rhythm with normal S1, S2. Psychiatric this patient is able to make decisions and demonstrates good insight into disease process. Alert and Oriented x 3. pleasant and cooperative. Notes Patient's wound bed currently showed signs of good granulation improvement overall which is excellent news. Fortunately there's no signs of any worsening currently as far as the overall appearance of the wound bed is concerned. Electronic Signature(s) Signed: 06/05/2018 8:30:49 AM By: Worthy Keeler PA-C Entered By: Worthy Keeler on 06/05/2018 08:24:42 [name] ([id number]) -------------------------------------------------------------------------------- Physician Orders Details Patient Name: Jose [name], Jose [name]. Date of Service: 06/04/2018 10:30 AM Medical Record Number: [id number] Patient Account Number: [id number] Date of Birth/Sex: [birth year] (73 y.o. M) Treating RN: Harold Barban Primary Care Provider: Golden Pop Other Clinician: Referring Provider: Golden Pop Treating Provider/Extender: Melburn Hake, HOYT Weeks in Treatment: 5 Verbal / Phone Orders: No Diagnosis Coding Wound Cleansing Wound #1 Right,Lateral Calcaneus o Clean wound with Normal Saline. Primary Wound Dressing Wound #1 Right,Lateral Calcaneus o Silver Collagen - Apply collagen to wound bed every other day (small piece) o Iodoform packing Gauze - pack gauze on top of collagen Secondary Dressing Wound #1  Right,Lateral Calcaneus o [place] Dressing Change Frequency  Wound #1 Right,Lateral Calcaneus o Change dressing every day. Follow-up Appointments Wound #1 Right,Lateral Calcaneus o Return Appointment in 2 weeks. Electronic Signature(s) Signed: 06/04/2018 3:00:04 PM By: Harold Barban Signed: 06/05/2018 8:30:49 AM By: Worthy Keeler PA-C Entered By: Harold Barban on 06/04/2018 11:11:07 [name] ([id number]) -------------------------------------------------------------------------------- Problem List Details Patient Name: Jose [name], Jose [name]. Date of Service: 06/04/2018 10:30 AM Medical Record Number: [id number] Patient Account Number: [id number] Date of Birth/Sex: 27-Jun-1945 (73 y.o. M) Treating RN: Harold Barban Primary Care Provider: Golden Pop Other Clinician: Referring Provider: Golden Pop Treating Provider/Extender: Melburn Hake, HOYT Weeks in Treatment: 5 Active Problems ICD-10 Evaluated Encounter Code Description Active Date Today Diagnosis T81.31XA Disruption of external operation (surgical) wound, not 04/30/2018 No Yes elsewhere classified, initial encounter L97.414 Non-pressure chronic ulcer of right heel and midfoot with 04/30/2018 No Yes necrosis of bone Inactive Problems Resolved Problems Electronic Signature(s) Signed: 06/05/2018 8:30:49 AM By: Worthy Keeler PA-C Entered By: Worthy Keeler on 06/05/2018 08:24:05 [name] ([id number]) -------------------------------------------------------------------------------- Progress Note Details Patient Name: Jose [name], Jose [name]. Date of Service: 06/04/2018 10:30 AM Medical Record Number: [id number] Patient Account Number: [id number] Date of Birth/Sex: 05-08-1945 (73 y.o. M) Treating RN: Harold Barban Primary Care Provider: Golden Pop Other Clinician: Referring Provider: Golden Pop Treating Provider/Extender: Melburn Hake, HOYT Weeks in Treatment: 5 Subjective Chief  Complaint Information obtained from Patient Right heel surgical ulcer History of Present Illness (HPI) 06/29/18 on evaluation today patient presents for initial evaluation our clinic for an issue that actually began December 19, 2017. He was actually climbing a ladder with the latter gateway causing him to fall he sustained a significant fracture to his right ankle requiring open reduction internal fixation of the ankle. The patient is an avid biker and not only does he ride bikes but he also builds. It has been very difficult for him to be off of his bicycle during the four months he has been attempting to heal since the surgery. Unfortunately the reason he comes to me today is that he still has an area that just will not close along the surgical incision site that he requested a second opinion regarding with wound care from his podiatrist. The patient really has no significant medical history and even for his age appears to be excellent shape in my pinion. No fevers, chills, nausea, or vomiting noted at this time. 05/07/2018 Seen today for follow up and management right foot wound s/p mechanical fall from ladder. Prior to wound culture from wound center, he was being treated with penicillin 500 mg every 6 hours for group B strep of the wound and later changed to Augmentin 875 mg PO BID. On last wound care visit bone culture was obtained and it was positive for enterococcus faecalis and nonviable bone with Osteomyelitis of the left calcaneum. Infectious disease provider suspect infected hardware from communicated fracture of the calcaneum. Mr. Kresse is scheduled for CT scan this week to determine if bone has fused. There is no redness surrounding the wound today. Some areas of slough present on the base of the wound. Plan to collaborate with ID provider for antibiotic management. 05/14/18 on evaluation today patient actually appears to be doing better in regard to the overall appearance of the wound  during evaluation today. With that being said we have undertaken the further testing that we had recommended here in the clinic when I first saw him. Again his CT scan which was performed show that there was a large defect in the  central aspect of the calcaneus where there is no osteophyte material or bone graft identified. This measured approximately 2.5 x 2.1 cm. This is stated possibly be resort to changes or osteomyelitis again I'm unsure of exactly which we would be looking at here. Again the patient does have a prior bone culture which was obtained by myself as well during the first visit that I saw him. This revealed enterococcus as the organism in question and the pathology report again subsequently also showed nonviable bone with osteomyelitis. Again all this couple together makes be concerned about what the appropriate next treatment options should be at this point. The patient has seen his podiatrist last Thursday but it was felt that everything was doing "okay" and that he just need to continue with wound care. With that being said I'm somewhat leaning towards referring the patient for a second opinion in this regard with everything going on at this point. 05/21/18 on evaluation today patient actually appears to be doing about the same in regard to his ankle ulcer. I do not feel any necrotic bone or bone fragments in the wound opening I did clean this with saline and gauze as well as a Q-tip today I could not get the large head of the Q-tip in just the back in. Nonetheless he did have some bleeding which in my pinion is a good sign. Still I am somewhat concerned about the fact that he doesn't seem to be making quite as much progress as I would like to see and again I'm still concerned about the infection. He will be seeing infectious disease later this week. 06/04/18 on evaluation today patient presents for follow-up after his evaluation with the orthopedic specialist. Fortunately he seems  to be doing very well in regard to the overall appearance of the wound upon evaluation and inspection today. I'm very pleased in this regard. With that being said I do not see any evidence of signs of active infection or lease worsening infection currently. He is still on the Augmentin currently. Overall the patient's pleased with how things seem to be progressing. So far there is no evidence of a surgical intervention that's been recommended for him based on what I'm hearing. Jose [name], Jose [name] ([id number]) Patient History Information obtained from Patient. Family History Cancer - Mother, Heart Disease - Mother, Hypertension - Mother, No family history of Diabetes, Hereditary Spherocytosis, Kidney Disease, Lung Disease, Seizures, Stroke, Thyroid Problems, Tuberculosis. Social History Never smoker, Marital Status - Married, Alcohol Use - Never, Drug Use - No History, Caffeine Use - Daily. Medical History Eyes Denies history of Cataracts, Glaucoma, Optic Neuritis Ear/Nose/Mouth/Throat Denies history of Chronic sinus problems/congestion, Middle ear problems Hematologic/Lymphatic Denies history of Anemia, Hemophilia, Human Immunodeficiency Virus, Lymphedema, Sickle Cell Disease Respiratory Denies history of Aspiration, Asthma, Chronic Obstructive Pulmonary Disease (COPD), Pneumothorax, Sleep Apnea, Tuberculosis Cardiovascular Denies history of Angina, Arrhythmia, Congestive Heart Failure, Coronary Artery Disease, Deep Vein Thrombosis, Hypertension, Hypotension, Myocardial Infarction, Peripheral Arterial Disease, Peripheral Venous Disease, Phlebitis, Vasculitis Gastrointestinal Denies history of Cirrhosis , Colitis, Crohn s, Hepatitis A, Hepatitis B, Hepatitis C Endocrine Denies history of Type I Diabetes, Type II Diabetes Genitourinary Denies history of End Stage Renal Disease Immunological Denies history of Lupus Erythematosus, Raynaud s, Scleroderma Integumentary (Skin) Denies  history of History of Burn, History of pressure wounds Musculoskeletal Denies history of Gout, Rheumatoid Arthritis, Osteoarthritis, Osteomyelitis Neurologic Denies history of Dementia, Neuropathy, Quadriplegia, Paraplegia, Seizure Disorder Oncologic Denies history of Received Chemotherapy, Received Radiation Psychiatric Denies history of  Anorexia/bulimia, Confinement Anxiety Medical And Surgical History Notes Genitourinary BPH Review of Systems (ROS) Constitutional Symptoms (General Health) Denies complaints or symptoms of Fever, Chills. Respiratory The patient has no complaints or symptoms. Cardiovascular The patient has no complaints or symptoms. Psychiatric The patient has no complaints or symptoms. Jose [name], CASTELLANA ([id number]) Objective Constitutional Well-nourished and well-hydrated in no acute distress. Vitals Time Taken: 10:26 AM, Height: 67 in, Weight: 148.1 lbs, BMI: 23.2, Temperature: 98.2 F, Pulse: 68 bpm, Respiratory Rate: 16 breaths/min, Blood Pressure: 136/68 mmHg. Respiratory normal breathing without difficulty. clear to auscultation bilaterally. Cardiovascular regular rate and rhythm with normal S1, S2. Psychiatric this patient is able to make decisions and demonstrates good insight into disease process. Alert and Oriented x 3. pleasant and cooperative. General Notes: Patient's wound bed currently showed signs of good granulation improvement overall which is excellent news. Fortunately there's no signs of any worsening currently as far as the overall appearance of the wound bed is concerned. Integumentary (Hair, Skin) Wound #1 status is Open. Original cause of wound was Surgical Injury. The wound is located on the Right,Lateral Calcaneus. The wound measures 0.2cm length x 0.3cm width x 0.6cm depth; 0.047cm^2 area and 0.028cm^3 volume. There is Fat Layer (Subcutaneous Tissue) Exposed exposed. There is no tunneling or undermining noted. There is a medium  amount of serous drainage noted. The wound margin is flat and intact. There is large (67-100%) pink granulation within the wound bed. There is a small (1-33%) amount of necrotic tissue within the wound bed including Adherent Slough. The periwound skin appearance exhibited: Scarring, Maceration. The periwound skin appearance did not exhibit: Callus, Crepitus, Excoriation, Induration, Rash, Dry/Scaly, Atrophie Blanche, Cyanosis, Ecchymosis, Hemosiderin Staining, Mottled, Pallor, Rubor, Erythema. Periwound temperature was noted as No Abnormality. The periwound has tenderness on palpation. 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 ([id number]) Wound Cleansing: Wound #1 Right,Lateral Calcaneus: Clean wound with Normal Saline. Primary Wound Dressing: Wound #1 Right,Lateral Calcaneus: Silver Collagen - Apply collagen to wound bed every other day (small piece) Iodoform packing Gauze - pack gauze on top of collagen Secondary Dressing: Wound #1 Right,Lateral Calcaneus: [place] Change Frequency: Wound #1 Right,Lateral Calcaneus: Change dressing every day. Follow-up Appointments: Wound #1 Right,Lateral Calcaneus: Return Appointment in 2 weeks. My suggestion is gonna be that we continue with the above wound care measures for the next week and the patient is in agreement with plan. If anything changes or worsens meantime he will contact the office let me know. Please see above for specific wound care orders. We will see patient for re-evaluation in 2 week(s) here in the clinic. If anything worsens or changes patient will contact our office for additional recommendations. Electronic Signature(s) Signed: 06/05/2018 8:30:49 AM By: Worthy Keeler PA-C Entered By: Worthy Keeler on 06/05/2018  08:24:54 [name] ([id number]) -------------------------------------------------------------------------------- ROS/PFSH Details Patient Name: AVEN, CHRISTEN. Date of Service: 06/04/2018 10:30 AM Medical Record Number: [id number] Patient Account Number: [id number] Date of Birth/Sex: [birth year] (73 y.o. M) Treating RN: Harold Barban Primary Care Provider: Golden Pop Other Clinician: Referring Provider: Golden Pop Treating Provider/Extender: Melburn Hake, HOYT Weeks in Treatment: 5 Information Obtained From Patient Wound History Do you currently have one or more open woundso Yes How many open wounds do you currently haveo 1 Approximately how long have you had your woundso 4 months How have you  been treating your wound(s) until nowo packing strip Has your wound(s) ever healed and then re-openedo No Have you had any lab work done in the past montho No Have you tested positive for an antibiotic resistant organism (MRSA, VRE)o No Have you tested positive for osteomyelitis (bone infection)o No Have you had any tests for circulation on your legso No Constitutional Symptoms (General Health) Complaints and Symptoms: Negative for: Fever; Chills Eyes Medical History: Negative for: Cataracts; Glaucoma; Optic Neuritis Ear/Nose/Mouth/Throat Medical History: Negative for: Chronic sinus problems/congestion; Middle ear problems Hematologic/Lymphatic Medical History: Negative for: Anemia; Hemophilia; Human Immunodeficiency Virus; Lymphedema; Sickle Cell Disease Respiratory Complaints and Symptoms: No Complaints or Symptoms Medical History: Negative for: Aspiration; Asthma; Chronic Obstructive Pulmonary Disease (COPD); Pneumothorax; Sleep Apnea; Tuberculosis Cardiovascular Complaints and Symptoms: No Complaints or Symptoms Medical History: Negative for: Angina; Arrhythmia; Congestive Heart Failure; Coronary Artery Disease; Deep Vein Thrombosis; ZAHKI, HOOGENDOORN K.  ([id number]) Hypertension; Hypotension; Myocardial Infarction; Peripheral Arterial Disease; Peripheral Venous Disease; Phlebitis; Vasculitis Gastrointestinal Medical History: Negative for: Cirrhosis ; Colitis; Crohnos; Hepatitis A; Hepatitis B; Hepatitis C Endocrine Medical History: Negative for: Type I Diabetes; Type II Diabetes Genitourinary Medical History: Negative for: End Stage Renal Disease Past Medical History Notes: BPH Immunological Medical History: Negative for: Lupus Erythematosus; Raynaudos; Scleroderma Integumentary (Skin) Medical History: Negative for: History of Burn; History of pressure wounds Musculoskeletal Medical History: Negative for: Gout; Rheumatoid Arthritis; Osteoarthritis; Osteomyelitis Neurologic Medical History: Negative for: Dementia; Neuropathy; Quadriplegia; Paraplegia; Seizure Disorder Oncologic Medical History: Negative for: Received Chemotherapy; Received Radiation Psychiatric Complaints and Symptoms: No Complaints or Symptoms Medical History: Negative for: Anorexia/bulimia; Confinement Anxiety Immunizations Pneumococcal Vaccine: Received Pneumococcal Vaccination: Yes Immunization Notes: up to date MIKIE, MISNER ([id number]) Implantable Devices None Family and Social History Cancer: Yes - Mother; Diabetes: No; Heart Disease: Yes - Mother; Hereditary Spherocytosis: No; Hypertension: Yes - Mother; Kidney Disease: No; Lung Disease: No; Seizures: No; Stroke: No; Thyroid Problems: No; Tuberculosis: No; Never smoker; Marital Status - Married; Alcohol Use: Never; Drug Use: No History; Caffeine Use: Daily; Financial Concerns: No; Food, Clothing or Shelter Needs: No; Support System Lacking: No; Transportation Concerns: No; Advanced Directives: No; Patient does not want information on Advanced Directives Physician Affirmation I have reviewed and agree with the above information. Electronic Signature(s) Signed: 06/05/2018 8:30:49 AM By:  Worthy Keeler PA-C Signed: 06/05/2018 3:46:03 PM By: Harold Barban Entered By: Worthy Keeler on 06/05/2018 08:24:31 MAKHI, MUZQUIZ ([id number]) -------------------------------------------------------------------------------- SuperBill Details Patient Name: DEMONTA, WOMBLES. Date of Service: 06/04/2018 Medical Record Number: [id number] Patient Account Number: [id number] Date of Birth/Sex: 04/02/1945 (73 y.o. 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

## 2018-06-18 ENCOUNTER — Encounter: Payer: PPO | Attending: Physician Assistant | Admitting: Physician Assistant

## 2018-06-18 ENCOUNTER — Other Ambulatory Visit: Payer: Self-pay

## 2018-06-18 DIAGNOSIS — T8133XA Disruption of traumatic injury wound repair, initial encounter: Secondary | ICD-10-CM | POA: Insufficient documentation

## 2018-06-18 DIAGNOSIS — L97414 Non-pressure chronic ulcer of right heel and midfoot with necrosis of bone: Secondary | ICD-10-CM | POA: Diagnosis not present

## 2018-06-18 DIAGNOSIS — W11XXXA Fall on and from ladder, initial encounter: Secondary | ICD-10-CM | POA: Insufficient documentation

## 2018-06-18 DIAGNOSIS — E11621 Type 2 diabetes mellitus with foot ulcer: Secondary | ICD-10-CM | POA: Insufficient documentation

## 2018-06-18 DIAGNOSIS — L97412 Non-pressure chronic ulcer of right heel and midfoot with fat layer exposed: Secondary | ICD-10-CM | POA: Diagnosis not present

## 2018-06-18 DIAGNOSIS — T8189XA Other complications of procedures, not elsewhere classified, initial encounter: Secondary | ICD-10-CM | POA: Diagnosis not present

## 2018-06-19 NOTE — Progress Notes (Signed)
Jose, [name] ([id number]) Visit Report for 06/18/2018 Arrival Information Details Patient Name: Jose [name], Jose [name]. Date of Service: 06/18/2018 9:15 AM Medical Record Number: [id number] Patient Account Number: [id number] Date of Birth/Sex: 1946-02-04 (73 y.o. M) Treating RN: Harold Barban Primary Care Meron Bocchino: Golden Pop Other Clinician: Referring Tilla Wilborn: Golden Pop Treating Tallin Hart/Extender: Melburn Hake, HOYT Weeks in Treatment: 7 Visit Information History Since Last Visit Added or deleted any medications: No Patient Arrived: Ambulatory Any new allergies or adverse reactions: No Arrival Time: 09:16 Had a fall or experienced change in No Accompanied By: self activities of daily living that may affect Transfer Assistance: None risk of falls: Patient Identification Verified: Yes Signs or symptoms of abuse/neglect since last visito No Secondary Verification Process Completed: Yes Hospitalized since last visit: No Implantable device outside of the clinic excluding No cellular tissue based products placed in the center since last visit: Has Dressing in Place as Prescribed: Yes Pain Present Now: No Electronic Signature(s) Signed: 06/18/2018 12:11:00 PM By: Lorine Bears RCP, RRT, CHT Entered By: Lorine Bears on 06/18/2018 09:17:01 Fohl, Verlene Mayer ([id number]) -------------------------------------------------------------------------------- Clinic Level of Care Assessment Details Patient Name: Jose, [name]. Date of Service: 06/18/2018 9:15 AM Medical Record Number: [id number] Patient Account Number: [id number] Date of Birth/Sex: Sep 09, 1945 (73 y.o. M) Treating RN: Harold Barban Primary Care Cybele Maule: Golden Pop Other Clinician: Referring Hennesy Sobalvarro: Golden Pop Treating Elsie Sakuma/Extender: Melburn Hake, HOYT Weeks in Treatment: 7 Clinic Level of Care Assessment Items TOOL 4 Quantity Score []  - Use when only an EandM is performed  on FOLLOW-UP visit 0 ASSESSMENTS - Nursing Assessment / Reassessment X - Reassessment of Co-morbidities (includes updates in patient status) 1 10 X- 1 5 Reassessment of Adherence to Treatment Plan ASSESSMENTS - Wound and Skin Assessment / Reassessment X - Simple Wound Assessment / Reassessment - one wound 1 5 []  - 0 Complex Wound Assessment / Reassessment - multiple wounds []  - 0 Dermatologic / Skin Assessment (not related to wound area) ASSESSMENTS - Focused Assessment []  - Circumferential Edema Measurements - multi extremities 0 []  - 0 Nutritional Assessment / Counseling / Intervention []  - 0 Lower Extremity Assessment (monofilament, tuning fork, pulses) []  - 0 Peripheral Arterial Disease Assessment (using hand held doppler) ASSESSMENTS - Ostomy and/or Continence Assessment and Care []  - Incontinence Assessment and Management 0 []  - 0 Ostomy Care Assessment and Management (repouching, etc.) PROCESS - Coordination of Care X - Simple Patient / Family Education for ongoing care 1 15 []  - 0 Complex (extensive) Patient / Family Education for ongoing care []  - 0 Staff obtains Programmer, systems, Records, Test Results / Process Orders []  - 0 Staff telephones HHA, Nursing Homes / Clarify orders / etc []  - 0 Routine Transfer to another Facility (non-emergent condition) []  - 0 Routine Hospital Admission (non-emergent condition) []  - 0 New Admissions / Biomedical engineer / Ordering NPWT, Apligraf, etc. []  - 0 Emergency Hospital Admission (emergent condition) X- 1 10 Simple Discharge Coordination SAIQUAN, HANDS. ([id number]) []  - 0 Complex (extensive) Discharge Coordination PROCESS - Special Needs []  - Pediatric / Minor Patient Management 0 []  - 0 Isolation Patient Management []  - 0 Hearing / Language / Visual special needs []  - 0 Assessment of Community assistance (transportation, D/C planning, etc.) []  - 0 Additional assistance / Altered mentation []  - 0 Support Surface(s)  Assessment (bed, cushion, seat, etc.) INTERVENTIONS - Wound Cleansing / Measurement X - Simple Wound Cleansing - one wound 1 5 []  - 0 Complex Wound Cleansing - multiple wounds  X- 1 5 Wound Imaging (photographs - any number of wounds) []  - 0 Wound Tracing (instead of photographs) X- 1 5 Simple Wound Measurement - one wound []  - 0 Complex Wound Measurement - multiple wounds INTERVENTIONS - Wound Dressings X - Small Wound Dressing one or multiple wounds 1 10 []  - 0 Medium Wound Dressing one or multiple wounds []  - 0 Large Wound Dressing one or multiple wounds []  - 0 Application of Medications - topical []  - 0 Application of Medications - injection INTERVENTIONS - Miscellaneous []  - External ear exam 0 []  - 0 Specimen Collection (cultures, biopsies, blood, body fluids, etc.) []  - 0 Specimen(s) / Culture(s) sent or taken to Lab for analysis []  - 0 Patient Transfer (multiple staff / Civil Service fast streamer / Similar devices) []  - 0 Simple Staple / Suture removal (25 or less) []  - 0 Complex Staple / Suture removal (26 or more) []  - 0 Hypo / Hyperglycemic Management (close monitor of Blood Glucose) []  - 0 Ankle / Brachial Index (ABI) - do not check if billed separately X- 1 5 Vital Signs Kastelic, Sumner K. ([id number]) Has the patient been seen at the hospital within the last three years: Yes Total Score: 75 Level Of Care: New/Established - Level 2 Electronic Signature(s) Signed: 06/18/2018 2:00:35 PM By: Harold Barban Entered By: Harold Barban on 06/18/2018 09:47:26 Dartt, Verlene Mayer ([id number]) -------------------------------------------------------------------------------- Encounter Discharge Information Details Patient Name: Jose, [name]. Date of Service: 06/18/2018 9:15 AM Medical Record Number: [id number] Patient Account Number: [id number] Date of Birth/Sex: 1946-02-13 (73 y.o. M) Treating RN: Cornell Barman Primary Care Derrika Ruffalo: Golden Pop Other Clinician: Referring  Johnathon Mittal: Golden Pop Treating Bain Whichard/Extender: Melburn Hake, HOYT Weeks in Treatment: 7 Encounter Discharge Information Items Discharge Condition: Stable Ambulatory Status: Ambulatory Discharge Destination: Home Transportation: Ambulance Accompanied By: self Schedule Follow-up Appointment: Yes Clinical Summary of Care: Electronic Signature(s) Signed: 06/18/2018 2:22:43 PM By: Gretta Cool, BSN, RN, CWS, Kim RN, BSN Entered By: Gretta Cool, BSN, RN, CWS, Kim on 06/18/2018 10:02:28 [name] ([id number]) -------------------------------------------------------------------------------- Lower Extremity Assessment Details Patient Name: ALHAJI, MCNEAL. Date of Service: 06/18/2018 9:15 AM Medical Record Number: [id number] Patient Account Number: [id number] Date of Birth/Sex: 09-16-1945 (73 y.o. M) Treating RN: Cornell Barman Primary Care Travin Marik: Golden Pop Other Clinician: Referring Liev Brockbank: Golden Pop Treating Diana Davenport/Extender: Melburn Hake, HOYT Weeks in Treatment: 7 Edema Assessment Assessed: [Left: No] [Right: Yes] Edema: [Left: N] [Right: o] Vascular Assessment Pulses: Dorsalis Pedis Palpable: [Right:Yes] Posterior Tibial Palpable: [Right:Yes] Electronic Signature(s) Signed: 06/18/2018 2:22:43 PM By: Gretta Cool, BSN, RN, CWS, Kim RN, BSN Entered By: Gretta Cool, BSN, RN, CWS, Kim on 06/18/2018 09:27:44 IZEN, PETZ ([id number]) -------------------------------------------------------------------------------- Multi Wound Chart Details Patient Name: LENY, MOROZOV. Date of Service: 06/18/2018 9:15 AM Medical Record Number: [id number] Patient Account Number: [id number] Date of Birth/Sex: October 08, 1945 (73 y.o. M) Treating RN: Harold Barban Primary Care Kristina Mcnorton: Golden Pop Other Clinician: Referring Jaecob Lowden: Golden Pop Treating Jaymee Tilson/Extender: STONE III, HOYT Weeks in Treatment: 7 Vital Signs Height(in): 67 Pulse(bpm): 59 Weight(lbs): 148.1 Blood Pressure(mmHg):  115/60 Body Mass Index(BMI): 23 Temperature(F): 98.1 Respiratory Rate 16 (breaths/min): Photos: [N/A:N/A] Wound Location: Right Calcaneus - Lateral N/A N/A Wounding Event: Surgical Injury N/A N/A Primary Etiology: Open Surgical Wound N/A N/A Date Acquired: 12/29/2017 N/A N/A Weeks of Treatment: 7 N/A N/A Wound Status: Open N/A N/A Measurements L x W x D 0.1x0.3x1.2 N/A N/A (cm) Area (cm) : 0.024 N/A N/A Volume (cm) : 0.028 N/A N/A % Reduction in Area: 89.80% N/A N/A % Reduction in  Volume: 90.10% N/A N/A Classification: Full Thickness Without N/A N/A Exposed Support Structures Exudate Amount: Medium N/A N/A Exudate Type: Serous N/A N/A Exudate Color: amber N/A N/A Wound Margin: Flat and Intact N/A N/A Granulation Amount: Large (67-100%) N/A N/A Granulation Quality: Pink N/A N/A Necrotic Amount: Small (1-33%) N/A N/A Exposed Structures: Fat Layer (Subcutaneous N/A N/A Tissue) Exposed: Yes Fascia: No Tendon: No Muscle: No Joint: No Bone: No Epithelialization: None N/A N/A RETT, STEHLIK. ([id number]) Periwound Skin Texture: Scarring: Yes N/A N/A Excoriation: No Induration: No Callus: No Crepitus: No Rash: No Periwound Skin Moisture: Maceration: No N/A N/A Dry/Scaly: No Periwound Skin Color: Atrophie Blanche: No N/A N/A Cyanosis: No Ecchymosis: No Erythema: No Hemosiderin Staining: No Mottled: No Pallor: No Rubor: No Temperature: No Abnormality N/A N/A Tenderness on Palpation: Yes N/A N/A Treatment Notes Electronic Signature(s) Signed: 06/18/2018 2:00:35 PM By: Harold Barban Entered By: Harold Barban on 06/18/2018 09:46:07 [name] ([id number]) -------------------------------------------------------------------------------- [place] Details Patient Name: SAIQUAN, HANDS. Date of Service: 06/18/2018 9:15 AM Medical Record Number: [id number] Patient Account Number: [id number] Date of Birth/Sex: Jun 22, 1945 (73 y.o.  M) Treating RN: Harold Barban Primary Care Roshonda Sperl: Golden Pop Other Clinician: Referring Aftyn Nott: Golden Pop Treating Keenon Leitzel/Extender: Melburn Hake, HOYT Weeks in Treatment: 7 Active Inactive Wound/Skin Impairment Nursing Diagnoses: Impaired tissue integrity Knowledge deficit related to ulceration/compromised skin integrity Goals: Ulcer/skin breakdown will have a volume reduction of 30% by week 4 Date Initiated: 04/30/2018 Target Resolution Date: 05/29/2018 Goal Status: Active Interventions: Assess patient/caregiver ability to obtain necessary supplies Assess patient/caregiver ability to perform ulcer/skin care regimen upon admission and as needed Assess ulceration(s) every visit Provide education on ulcer and skin care Notes: Electronic Signature(s) Signed: 06/18/2018 2:00:35 PM By: Harold Barban Entered By: Harold Barban on 06/18/2018 09:45:59 Chisom, Verlene Mayer ([id number]) -------------------------------------------------------------------------------- Pain Assessment Details Patient Name: PRAKASH, KIMBERLING. Date of Service: 06/18/2018 9:15 AM Medical Record Number: [id number] Patient Account Number: [id number] Date of Birth/Sex: 13-Sep-1945 (73 y.o. M) Treating RN: Harold Barban Primary Care Kooper Godshall: Golden Pop Other Clinician: Referring Wilfred Dayrit: Golden Pop Treating Cinda Hara/Extender: Melburn Hake, HOYT Weeks in Treatment: 7 Active Problems Location of Pain Severity and Description of Pain Patient Has Paino No Site Locations Pain Management and Medication Current Pain Management: Electronic Signature(s) Signed: 06/18/2018 12:11:00 PM By: Lorine Bears RCP, RRT, CHT Signed: 06/18/2018 2:00:35 PM By: Harold Barban Entered By: Lorine Bears on 06/18/2018 09:17:13 [name] ([id number]) -------------------------------------------------------------------------------- Patient/Caregiver Education Details Patient  Name: JOFFREY, KERCE. Date of Service: 06/18/2018 9:15 AM Medical Record Number: [id number] Patient Account Number: [id number] Date of Birth/Gender: 1945-07-22 (73 y.o. M) Treating RN: Harold Barban Primary Care Physician: Golden Pop Other Clinician: Referring Physician: Golden Pop Treating Physician/Extender: Sharalyn Ink in Treatment: 7 Education Assessment Education Provided To: Patient Education Topics Provided Wound/Skin Impairment: Handouts: Caring for Your Ulcer Methods: Demonstration, Explain/Verbal Responses: State content correctly Electronic Signature(s) Signed: 06/18/2018 2:00:35 PM By: Harold Barban Entered By: Harold Barban on 06/18/2018 09:46:48 [name] ([id number]) -------------------------------------------------------------------------------- Wound Assessment Details Patient Name: MOHAMAD, BRUSO. Date of Service: 06/18/2018 9:15 AM Medical Record Number: [id number] Patient Account Number: [id number] Date of Birth/Sex: Oct 19, 1945 (73 y.o. M) Treating RN: Cornell Barman Primary Care Tarig Zimmers: Golden Pop Other Clinician: Referring Dilan Fullenwider: Golden Pop Treating Syann Cupples/Extender: Melburn Hake, HOYT Weeks in Treatment: 7 Wound Status Wound Number: 1 Primary Etiology: Open Surgical Wound Wound Location: Right Calcaneus - Lateral Wound Status: Open Wounding Event: Surgical Injury Date Acquired: 12/29/2017 Weeks Of Treatment:  7 Clustered Wound: No Photos Wound Measurements Length: (cm) 0.1 Width: (cm) 0.3 Depth: (cm) 1.2 Area: (cm) 0.024 Volume: (cm) 0.028 % Reduction in Area: 89.8% % Reduction in Volume: 90.1% Epithelialization: None Wound Description Full Thickness Without Exposed Support Foul Odo Classification: Structures Slough/F Wound Margin: Flat and Intact Exudate Medium Amount: Exudate Type: Serous Exudate Color: amber r After Cleansing: No ibrino Yes Wound Bed Granulation Amount: Large (67-100%)  Exposed Structure Granulation Quality: Pink Fascia Exposed: No Necrotic Amount: Small (1-33%) Fat Layer (Subcutaneous Tissue) Exposed: Yes Necrotic Quality: Adherent Slough Tendon Exposed: No Muscle Exposed: No Joint Exposed: No Bone Exposed: No Periwound Skin Texture MARVIN, GRABILL. ([id number]) Texture Color No Abnormalities Noted: No No Abnormalities Noted: No Callus: No Atrophie Blanche: No Crepitus: No Cyanosis: No Excoriation: No Ecchymosis: No Induration: No Erythema: No Rash: No Hemosiderin Staining: No Scarring: Yes Mottled: No Pallor: No Moisture Rubor: No No Abnormalities Noted: No Dry / Scaly: No Temperature / Pain Maceration: No Temperature: No Abnormality Tenderness on Palpation: Yes Treatment Notes Wound #1 (Right, Lateral Calcaneus) Notes prisma, Iodoform packing gauze, telfa Manufacturing systems engineer) Signed: 06/18/2018 2:22:43 PM By: Gretta Cool, BSN, RN, CWS, Kim RN, BSN Entered By: Gretta Cool, BSN, RN, CWS, Kim on 06/18/2018 09:27:04 [name] ([id number]) -------------------------------------------------------------------------------- Vitals Details Patient Name: QUANAH, MAJKA. Date of Service: 06/18/2018 9:15 AM Medical Record Number: [id number] Patient Account Number: [id number] Date of Birth/Sex: December 29, 1945 (73 y.o. M) Treating RN: Harold Barban Primary Care Lou Loewe: Golden Pop Other Clinician: Referring Airam Runions: Golden Pop Treating Kagen Kunath/Extender: Melburn Hake, HOYT Weeks in Treatment: 7 Vital Signs Time Taken: 09:17 Temperature (F): 98.1 Height (in): 67 Pulse (bpm): 59 Weight (lbs): 148.1 Respiratory Rate (breaths/min): 16 Body Mass Index (BMI): 23.2 Blood Pressure (mmHg): 115/60 Reference Range: 80 - 120 mg / dl Electronic Signature(s) Signed: 06/18/2018 12:11:00 PM By: Lorine Bears RCP, RRT, CHT Entered By: Lorine Bears on 06/18/2018 09:19:34

## 2018-06-20 NOTE — Progress Notes (Signed)
FREDERICO, [name] ([id number]) Visit Report for 06/18/2018 Chief Complaint Document Details Patient Name: Jose [name], Jose [name]. Date of Service: 06/18/2018 9:15 AM Medical Record Number: [id number] Patient Account Number: [id number] Date of Birth/Sex: 1946/02/09 (73 y.o. M) Treating RN: Harold Barban Primary Care Provider: Golden Pop Other Clinician: Referring Provider: Golden Pop Treating Provider/Extender: Melburn Hake, Keleigh Kazee Weeks in Treatment: 7 Information Obtained from: Patient Chief Complaint Right heel surgical ulcer Electronic Signature(s) Signed: 06/18/2018 11:22:15 PM By: Worthy Keeler PA-C Entered By: Worthy Keeler on 06/18/2018 09:14:58 Cleghorn, Verlene Mayer ([id number]) -------------------------------------------------------------------------------- HPI Details Patient Name: Jose, [name]. Date of Service: 06/18/2018 9:15 AM Medical Record Number: [id number] Patient Account Number: [id number] Date of Birth/Sex: April 28, 1945 (73 y.o. M) Treating RN: Harold Barban Primary Care Provider: Golden Pop Other Clinician: Referring Provider: Golden Pop Treating Provider/Extender: Melburn Hake, Lafreda Casebeer Weeks in Treatment: 7 History of Present Illness HPI Description: 06/29/18 on evaluation today patient presents for initial evaluation our clinic for an issue that actually began December 19, 2017. He was actually climbing a ladder with the latter gateway causing him to fall he sustained a significant fracture to his right ankle requiring open reduction internal fixation of the ankle. The patient is an avid biker and not only does he ride bikes but he also builds. It has been very difficult for him to be off of his bicycle during the four months he has been attempting to heal since the surgery. Unfortunately the reason he comes to me today is that he still has an area that just will not close along the surgical incision site that he requested a second opinion regarding with wound  care from his podiatrist. The patient really has no significant medical history and even for his age appears to be excellent shape in my pinion. No fevers, chills, nausea, or vomiting noted at this time. 05/07/2018 Seen today for follow up and management right foot wound s/p mechanical fall from ladder. Prior to wound culture from wound center, he was being treated with penicillin 500 mg every 6 hours for group B strep of the wound and later changed to Augmentin 875 mg PO BID. On last wound care visit bone culture was obtained and it was positive for enterococcus faecalis and nonviable bone with Osteomyelitis of the left calcaneum. Infectious disease provider suspect infected hardware from communicated fracture of the calcaneum. Mr. Figg is scheduled for CT scan this week to determine if bone has fused. There is no redness surrounding the wound today. Some areas of slough present on the base of the wound. Plan to collaborate with ID provider for antibiotic management. 05/14/18 on evaluation today patient actually appears to be doing better in regard to the overall appearance of the wound during evaluation today. With that being said we have undertaken the further testing that we had recommended here in the clinic when I first saw him. Again his CT scan which was performed show that there was a large defect in the central aspect of the calcaneus where there is no osteophyte material or bone graft identified. This measured approximately 2.5 x 2.1 cm. This is stated possibly be resort to changes or osteomyelitis again I'm unsure of exactly which we would be looking at here. Again the patient does have a prior bone culture which was obtained by myself as well during the first visit that I saw him. This revealed enterococcus as the organism in question and the pathology report again subsequently also showed nonviable bone with osteomyelitis. Again all  this couple together makes be concerned about what the  appropriate next treatment options should be at this point. The patient has seen his podiatrist last Thursday but it was felt that everything was doing "okay" and that he just need to continue with wound care. With that being said I'm somewhat leaning towards referring the patient for a second opinion in this regard with everything going on at this point. 05/21/18 on evaluation today patient actually appears to be doing about the same in regard to his ankle ulcer. I do not feel any necrotic bone or bone fragments in the wound opening I did clean this with saline and gauze as well as a Q-tip today I could not get the large head of the Q-tip in just the back in. Nonetheless he did have some bleeding which in my pinion is a good sign. Still I am somewhat concerned about the fact that he doesn't seem to be making quite as much progress as I would like to see and again I'm still concerned about the infection. He will be seeing infectious disease later this week. 06/04/18 on evaluation today patient presents for follow-up after his evaluation with the orthopedic specialist. Fortunately he seems to be doing very well in regard to the overall appearance of the wound upon evaluation and inspection today. I'm very pleased in this regard. With that being said I do not see any evidence of signs of active infection or lease worsening infection currently. He is still on the Augmentin currently. Overall the patient's pleased with how things seem to be progressing. So far there is no evidence of a surgical intervention that's been recommended for him based on what I'm hearing. 06/18/18 on evaluation today patient appears to be doing very well in regard to his lower extremity ulcer at the ankle. This in fact appears to be just a very small hole there's really no significant undermining of all I feel like he has done excellent since I last saw him. Fortunately there's no signs of active infection at this  time. Electronic Signature(s) SOHUM, DELILLO ([id number]) Signed: 06/18/2018 11:22:15 PM By: Worthy Keeler PA-C Entered By: Worthy Keeler on 06/18/2018 15:17:61 SAMER, DUTTON ([id number]) -------------------------------------------------------------------------------- Physical Exam Details Patient Name: CHRISTROPHER, GINTZ. Date of Service: 06/18/2018 9:15 AM Medical Record Number: [id number] Patient Account Number: [id number] Date of Birth/Sex: Aug 16, 1945 (73 y.o. M) Treating RN: Harold Barban Primary Care Provider: Golden Pop Other Clinician: Referring Provider: Golden Pop Treating Provider/Extender: STONE III, Jayvan Mcshan Weeks in Treatment: 7 Constitutional Well-nourished and well-hydrated in no acute distress. Respiratory normal breathing without difficulty. clear to auscultation bilaterally. Cardiovascular regular rate and rhythm with normal S1, S2. Psychiatric this patient is able to make decisions and demonstrates good insight into disease process. Alert and Oriented x 3. pleasant and cooperative. Notes Patient's wound did not require sharp debridement at this point I was able to clean the wound carefully which is the skinny probe really there was not much room to even pack or probe into this area that is excellent news. Electronic Signature(s) Signed: 06/18/2018 11:22:15 PM By: Worthy Keeler PA-C Entered By: Worthy Keeler on 06/18/2018 22:57:31 Horvath, Verlene Mayer ([id number]) -------------------------------------------------------------------------------- Physician Orders Details Patient Name: PAYTON, PRINSEN. Date of Service: 06/18/2018 9:15 AM Medical Record Number: [id number] Patient Account Number: [id number] Date of Birth/Sex: 1945-06-27 (73 y.o. 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 Primary Wound Dressing Wound #1 Right,Lateral Calcaneus o Silver Collagen - Apply collagen to wound bed every other day (small piece) o Iodoform packing Gauze - pack gauze on top of collagen Secondary Dressing Wound #1 Right,Lateral Calcaneus o [place] Dressing Change Frequency Wound #1 Right,Lateral Calcaneus o Change dressing every day. Follow-up Appointments Wound #1 Right,Lateral Calcaneus o Return Appointment in 3 weeks. Electronic Signature(s) Signed: 06/18/2018 2:00:35 PM By: Harold Barban Signed: 06/18/2018 11:22:15 PM By: Worthy Keeler PA-C Entered By: Harold Barban on 06/18/2018 09:50:33 Tufte, Verlene Mayer ([id number]) -------------------------------------------------------------------------------- Problem List Details Patient Name: ALOK, MINSHALL. Date of Service: 06/18/2018 9:15 AM Medical Record Number: [id number] Patient Account Number: [id number] Date of Birth/Sex: 02-02-1946 (73 y.o. M) Treating RN: Harold Barban Primary Care Provider: Golden Pop Other Clinician: Referring Provider: Golden Pop Treating Provider/Extender: Melburn Hake, Raquel Racey Weeks in Treatment: 7 Active Problems ICD-10 Evaluated Encounter Code Description Active Date Today Diagnosis T81.31XA Disruption of external operation (surgical) wound, not 04/30/2018 No Yes elsewhere classified, initial encounter L97.414 Non-pressure chronic ulcer of right heel and midfoot with 04/30/2018 No Yes necrosis of bone Inactive Problems Resolved Problems Electronic Signature(s) Signed: 06/18/2018 11:22:15 PM By: Worthy Keeler PA-C Entered By: Worthy Keeler on  06/18/2018 09:14:51 Severe, Verlene Mayer ([id number]) -------------------------------------------------------------------------------- Progress Note Details Patient Name: JEMEL, ONO. Date of Service: 06/18/2018 9:15 AM Medical Record Number: [id number] Patient Account Number: [id number] Date of Birth/Sex: 1945-04-02 (73 y.o. M) Treating RN: Harold Barban Primary Care Provider: Golden Pop Other Clinician: Referring Provider: Golden Pop Treating Provider/Extender: Melburn Hake, Krista Godsil Weeks in Treatment: 7 Subjective Chief Complaint Information obtained from Patient Right heel surgical ulcer History of Present Illness (HPI) 06/29/18 on evaluation today patient presents for initial evaluation our clinic for an issue that actually began December 19, 2017. He was actually climbing a ladder with the latter gateway causing him to fall he sustained a significant fracture to his right ankle requiring open reduction internal fixation of the ankle. The patient is an avid biker and not only does he ride bikes but he also builds. It has been very difficult for him to be off of his bicycle during the four months he has been attempting to heal since the surgery. Unfortunately the reason he comes to me today is that he still has an area that just will not close along the surgical incision site that he requested a second opinion regarding with wound care from his podiatrist. The patient really has no significant medical history and even for his age appears to be excellent shape in my pinion. No fevers, chills, nausea, or vomiting noted at this time. 05/07/2018 Seen today for follow up and management right foot wound s/p mechanical fall from ladder. Prior to wound culture from wound center, he was being treated with penicillin 500 mg every 6 hours for group B strep of the wound and later changed to Augmentin 875 mg PO BID. On last wound care visit bone culture was obtained and it was positive  for enterococcus faecalis and nonviable bone with Osteomyelitis of the left calcaneum. Infectious disease provider suspect infected hardware from communicated fracture of the calcaneum. Mr. Baney is scheduled for CT scan this week to determine  if bone has fused. There is no redness surrounding the wound today. Some areas of slough present on the base of the wound. Plan to collaborate with ID provider for antibiotic management. 05/14/18 on evaluation today patient actually appears to be doing better in regard to the overall appearance of the wound during evaluation today. With that being said we have undertaken the further testing that we had recommended here in the clinic when I first saw him. Again his CT scan which was performed show that there was a large defect in the central aspect of the calcaneus where there is no osteophyte material or bone graft identified. This measured approximately 2.5 x 2.1 cm. This is stated possibly be resort to changes or osteomyelitis again I'm unsure of exactly which we would be looking at here. Again the patient does have a prior bone culture which was obtained by myself as well during the first visit that I saw him. This revealed enterococcus as the organism in question and the pathology report again subsequently also showed nonviable bone with osteomyelitis. Again all this couple together makes be concerned about what the appropriate next treatment options should be at this point. The patient has seen his podiatrist last Thursday but it was felt that everything was doing "okay" and that he just need to continue with wound care. With that being said I'm somewhat leaning towards referring the patient for a second opinion in this regard with everything going on at this point. 05/21/18 on evaluation today patient actually appears to be doing about the same in regard to his ankle ulcer. I do not feel any necrotic bone or bone fragments in the wound opening I did clean  this with saline and gauze as well as a Q-tip today I could not get the large head of the Q-tip in just the back in. Nonetheless he did have some bleeding which in my pinion is a good sign. Still I am somewhat concerned about the fact that he doesn't seem to be making quite as much progress as I would like to see and again I'm still concerned about the infection. He will be seeing infectious disease later this week. 06/04/18 on evaluation today patient presents for follow-up after his evaluation with the orthopedic specialist. Fortunately he seems to be doing very well in regard to the overall appearance of the wound upon evaluation and inspection today. I'm very pleased in this regard. With that being said I do not see any evidence of signs of active infection or lease worsening infection currently. He is still on the Augmentin currently. Overall the patient's pleased with how things seem to be progressing. So far there is no evidence of a surgical intervention that's been recommended for him based on what I'm hearing. XADRIAN, CRAIGHEAD ([id number]) 06/18/18 on evaluation today patient appears to be doing very well in regard to his lower extremity ulcer at the ankle. This in fact appears to be just a very small hole there's really no significant undermining of all I feel like he has done excellent since I last saw him. Fortunately there's no signs of active infection at this time. Patient History Information obtained from Patient. Family History Cancer - Mother, Heart Disease - Mother, Hypertension - Mother, No family history of Diabetes, Hereditary Spherocytosis, Kidney Disease, Lung Disease, Seizures, Stroke, Thyroid Problems, Tuberculosis. Social History Never smoker, Marital Status - Married, Alcohol Use - Never, Drug Use - No History, Caffeine Use - Daily. Medical History Eyes Denies history of  Cataracts, Glaucoma, Optic Neuritis Ear/Nose/Mouth/Throat Denies history of Chronic sinus  problems/congestion, Middle ear problems Hematologic/Lymphatic Denies history of Anemia, Hemophilia, Human Immunodeficiency Virus, Lymphedema, Sickle Cell Disease Respiratory Denies history of Aspiration, Asthma, Chronic Obstructive Pulmonary Disease (COPD), Pneumothorax, Sleep Apnea, Tuberculosis Cardiovascular Denies history of Angina, Arrhythmia, Congestive Heart Failure, Coronary Artery Disease, Deep Vein Thrombosis, Hypertension, Hypotension, Myocardial Infarction, Peripheral Arterial Disease, Peripheral Venous Disease, Phlebitis, Vasculitis Gastrointestinal Denies history of Cirrhosis , Colitis, Crohn s, Hepatitis A, Hepatitis B, Hepatitis C Endocrine Denies history of Type I Diabetes, Type II Diabetes Genitourinary Denies history of End Stage Renal Disease Immunological Denies history of Lupus Erythematosus, Raynaud s, Scleroderma Integumentary (Skin) Denies history of History of Burn, History of pressure wounds Musculoskeletal Denies history of Gout, Rheumatoid Arthritis, Osteoarthritis, Osteomyelitis Neurologic Denies history of Dementia, Neuropathy, Quadriplegia, Paraplegia, Seizure Disorder Oncologic Denies history of Received Chemotherapy, Received Radiation Psychiatric Denies history of Anorexia/bulimia, Confinement Anxiety Medical And Surgical History Notes Genitourinary BPH Review of Systems (ROS) Constitutional Symptoms (General Health) Denies complaints or symptoms of Fatigue, Fever, Chills, Marked Weight Change. Respiratory Denies complaints or symptoms of Chronic or frequent coughs, Shortness of Breath. Cardiovascular Complains or has symptoms of LE edema. LOVE, MILBOURNE ([id number]) Denies complaints or symptoms of Chest pain. Psychiatric Denies complaints or symptoms of Anxiety, Claustrophobia. Objective Constitutional Well-nourished and well-hydrated in no acute distress. Vitals Time Taken: 9:17 AM, Height: 67 in, Weight: 148.1 lbs, BMI: 23.2,  Temperature: 98.1 F, Pulse: 59 bpm, Respiratory Rate: 16 breaths/min, Blood Pressure: 115/60 mmHg. Respiratory normal breathing without difficulty. clear to auscultation bilaterally. Cardiovascular regular rate and rhythm with normal S1, S2. Psychiatric this patient is able to make decisions and demonstrates good insight into disease process. Alert and Oriented x 3. pleasant and cooperative. General Notes: Patient's wound did not require sharp debridement at this point I was able to clean the wound carefully which is the skinny probe really there was not much room to even pack or probe into this area that is excellent news. Integumentary (Hair, Skin) Wound #1 status is Open. Original cause of wound was Surgical Injury. The wound is located on the Right,Lateral Calcaneus. The wound measures 0.1cm length x 0.3cm width x 1.2cm depth; 0.024cm^2 area and 0.028cm^3 volume. There is Fat Layer (Subcutaneous Tissue) Exposed exposed. There is a medium amount of serous drainage noted. The wound margin is flat and intact. There is large (67-100%) pink granulation within the wound bed. There is a small (1-33%) amount of necrotic tissue within the wound bed including Adherent Slough. The periwound skin appearance exhibited: Scarring. The periwound skin appearance did not exhibit: Callus, Crepitus, Excoriation, Induration, Rash, Dry/Scaly, Maceration, Atrophie Blanche, Cyanosis, Ecchymosis, Hemosiderin Staining, Mottled, Pallor, Rubor, Erythema. Periwound temperature was noted as No Abnormality. The periwound has tenderness on palpation. 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 ([id number]) Plan Wound Cleansing: Wound #1 Right,Lateral Calcaneus: Clean wound with Normal Saline. Primary Wound Dressing: Wound #1  Right,Lateral Calcaneus: Silver Collagen - Apply collagen to wound bed every other day (small piece) Iodoform packing Gauze - pack gauze on top of collagen Secondary Dressing: Wound #1 Right,Lateral Calcaneus: [place] Dressing Change Frequency: Wound #1 Right,Lateral Calcaneus: Change dressing every day. Follow-up Appointments: Wound #1 Right,Lateral Calcaneus: Return Appointment in 3 weeks. My suggestion for the patient currently is gonna be that we go ahead and initiate the above wound care  measures for the next week. He's in agreement with plan. I will subsequently see were things that at follow-up. If anything changes or worsens meantime he will contact the office let me know. Please see above for specific wound care orders. We will see patient for re-evaluation in 3 week(s) here in the clinic. If anything worsens or changes patient will contact our office for additional recommendations. Electronic Signature(s) Signed: 06/18/2018 11:22:15 PM By: Worthy Keeler PA-C Entered By: Worthy Keeler on 06/18/2018 22:59:06 MAKYI, LEDO ([id number]) -------------------------------------------------------------------------------- ROS/PFSH Details Patient Name: SEBASTION, JUN. Date of Service: 06/18/2018 9:15 AM Medical Record Number: [id number] Patient Account Number: [id number] Date of Birth/Sex: 1945/07/30 (73 y.o. M) Treating RN: Harold Barban Primary Care Provider: Golden Pop Other Clinician: Referring Provider: Golden Pop Treating Provider/Extender: Melburn Hake, Mildreth Reek Weeks in Treatment: 7 Information Obtained From Patient Constitutional Symptoms (General Health) Complaints and Symptoms: Negative for: Fatigue; Fever; Chills; Marked Weight Change Respiratory Complaints and Symptoms: Negative for: Chronic or frequent coughs; Shortness of Breath Medical History: Negative for: Aspiration; Asthma; Chronic Obstructive Pulmonary Disease (COPD); Pneumothorax; Sleep  Apnea; Tuberculosis Cardiovascular Complaints and Symptoms: Positive for: LE edema Negative for: Chest pain Medical History: Negative for: Angina; Arrhythmia; Congestive Heart Failure; Coronary Artery Disease; Deep Vein Thrombosis; Hypertension; Hypotension; Myocardial Infarction; Peripheral Arterial Disease; Peripheral Venous Disease; Phlebitis; Vasculitis Psychiatric Complaints and Symptoms: Negative for: Anxiety; Claustrophobia Medical History: Negative for: Anorexia/bulimia; Confinement Anxiety Eyes Medical History: Negative for: Cataracts; Glaucoma; Optic Neuritis Ear/Nose/Mouth/Throat Medical History: Negative for: Chronic sinus problems/congestion; Middle ear problems Hematologic/Lymphatic Medical History: Negative for: Anemia; Hemophilia; Human Immunodeficiency Virus; Lymphedema; Sickle Cell Disease KEINAN, BROUILLET ([id number]) Gastrointestinal Medical History: Negative for: Cirrhosis ; Colitis; Crohnos; Hepatitis A; Hepatitis B; Hepatitis C Endocrine Medical History: Negative for: Type I Diabetes; Type II Diabetes Genitourinary Medical History: Negative for: End Stage Renal Disease Past Medical History Notes: BPH Immunological Medical History: Negative for: Lupus Erythematosus; Raynaudos; Scleroderma Integumentary (Skin) Medical History: Negative for: History of Burn; History of pressure wounds Musculoskeletal Medical History: Negative for: Gout; Rheumatoid Arthritis; Osteoarthritis; Osteomyelitis Neurologic Medical History: Negative for: Dementia; Neuropathy; Quadriplegia; Paraplegia; Seizure Disorder Oncologic Medical History: Negative for: Received Chemotherapy; Received Radiation Immunizations Pneumococcal Vaccine: Received Pneumococcal Vaccination: Yes Immunization Notes: up to date Implantable Devices None Family and Social History Cancer: Yes - Mother; Diabetes: No; Heart Disease: Yes - Mother; Hereditary Spherocytosis: No; Hypertension: Yes  - Mother; Kidney Disease: No; Lung Disease: No; Seizures: No; Stroke: No; Thyroid Problems: No; Tuberculosis: No; Never smoker; Marital Status - Married; Alcohol Use: Never; Drug Use: No History; Caffeine Use: Daily; Financial Concerns: No; Food, Clothing or Shelter Needs: No; Support System Lacking: No; Transportation Concerns: No Physician Affirmation I have reviewed and agree with the above information. JAROD, BOZZO ([id number]) Electronic Signature(s) Signed: 06/18/2018 11:22:15 PM By: Worthy Keeler PA-C Signed: 06/20/2018 1:06:36 PM By: Harold Barban Entered By: Worthy Keeler on 06/18/2018 22:57:19 FURMAN, TRENTMAN ([id number]) -------------------------------------------------------------------------------- SuperBill Details Patient Name: LYONEL, MOREJON. Date of Service: 06/18/2018 Medical Record Number: [id number] Patient Account Number: [id number] Date of Birth/Sex: 02-20-1946 (73 y.o. 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

## 2018-07-03 ENCOUNTER — Telehealth: Payer: Self-pay

## 2018-07-03 NOTE — Telephone Encounter (Signed)
Patient scheduled for an AWV on 07/11/2018 with [facility], Due to Covid-19 pandemic this is unable to be done in office, called patient to see if they are able to do this virtually or in office in the back building(next to [place]) Left message for patient to call back.  Direct call back [phone]

## 2018-07-09 ENCOUNTER — Encounter: Payer: PPO | Admitting: Family Medicine

## 2018-07-09 ENCOUNTER — Encounter: Payer: PPO | Attending: Physician Assistant | Admitting: Physician Assistant

## 2018-07-09 ENCOUNTER — Other Ambulatory Visit: Payer: Self-pay

## 2018-07-09 DIAGNOSIS — L97414 Non-pressure chronic ulcer of right heel and midfoot with necrosis of bone: Secondary | ICD-10-CM | POA: Diagnosis not present

## 2018-07-09 DIAGNOSIS — T8189XA Other complications of procedures, not elsewhere classified, initial encounter: Secondary | ICD-10-CM | POA: Diagnosis not present

## 2018-07-09 DIAGNOSIS — W11XXXS Fall on and from ladder, sequela: Secondary | ICD-10-CM | POA: Insufficient documentation

## 2018-07-09 DIAGNOSIS — L97412 Non-pressure chronic ulcer of right heel and midfoot with fat layer exposed: Secondary | ICD-10-CM | POA: Diagnosis not present

## 2018-07-09 DIAGNOSIS — T8131XA Disruption of external operation (surgical) wound, not elsewhere classified, initial encounter: Secondary | ICD-10-CM | POA: Diagnosis not present

## 2018-07-09 DIAGNOSIS — S82891S Other fracture of right lower leg, sequela: Secondary | ICD-10-CM | POA: Insufficient documentation

## 2018-07-11 ENCOUNTER — Ambulatory Visit: Payer: PPO

## 2018-07-12 NOTE — Progress Notes (Signed)
JAVEL, HERSH ([id number]) Visit Report for 07/09/2018 Chief Complaint Document Details Patient Name: Jose [name], Jose [name]. Date of Service: 07/09/2018 9:15 AM Medical Record Number: [id number] Patient Account Number: [id number] Date of Birth/Sex: 28-Apr-1945 (73 y.o. M) Treating RN: Harold Barban Primary Care Provider: Golden Pop Other Clinician: Referring Provider: Golden Pop Treating Provider/Extender: Melburn Hake, HOYT Weeks in Treatment: 10 Information Obtained from: Patient Chief Complaint Right heel surgical ulcer Electronic Signature(s) Signed: 07/09/2018 4:49:37 PM By: Worthy Keeler PA-C Entered By: Worthy Keeler on 07/09/2018 09:59:18 [place], Verlene Mayer ([id number]) -------------------------------------------------------------------------------- HPI Details Patient Name: Jose [name]. Date of Service: 07/09/2018 9:15 AM Medical Record Number: [id number] Patient Account Number: [id number] Date of Birth/Sex: 01/05/1946 (73 y.o. M) Treating RN: Harold Barban Primary Care Provider: Golden Pop Other Clinician: Referring Provider: Golden Pop Treating Provider/Extender: Melburn Hake, HOYT Weeks in Treatment: 10 History of Present Illness HPI Description: 06/29/18 on evaluation today patient presents for initial evaluation our clinic for an issue that actually began December 19, 2017. He was actually climbing a ladder with the latter gateway causing him to fall he sustained a significant fracture to his right ankle requiring open reduction internal fixation of the ankle. The patient is an avid biker and not only does he ride bikes but he also builds. It has been very difficult for him to be off of his bicycle during the four months he has been attempting to heal since the surgery. Unfortunately the reason he comes to me today is that he still has an area that just will not close along the surgical incision site that he requested a second opinion regarding with wound care  from his podiatrist. The patient really has no significant medical history and even for his age appears to be excellent shape in my pinion. No fevers, chills, nausea, or vomiting noted at this time. 05/07/2018 Seen today for follow up and management right foot wound s/p mechanical fall from ladder. Prior to wound culture from wound center, he was being treated with penicillin 500 mg every 6 hours for group B strep of the wound and later changed to Augmentin 875 mg PO BID. On last wound care visit bone culture was obtained and it was positive for enterococcus faecalis and nonviable bone with Osteomyelitis of the left calcaneum. Infectious disease provider suspect infected hardware from communicated fracture of the calcaneum. Mr. Mcfayden is scheduled for CT scan this week to determine if bone has fused. There is no redness surrounding the wound today. Some areas of slough present on the base of the wound. Plan to collaborate with ID provider for antibiotic management. 05/14/18 on evaluation today patient actually appears to be doing better in regard to the overall appearance of the wound during evaluation today. With that being said we have undertaken the further testing that we had recommended here in the clinic when I first saw him. Again his CT scan which was performed show that there was a large defect in the central aspect of the calcaneus where there is no osteophyte material or bone graft identified. This measured approximately 2.5 x 2.1 cm. This is stated possibly be resort to changes or osteomyelitis again I'm unsure of exactly which we would be looking at here. Again the patient does have a prior bone culture which was obtained by myself as well during the first visit that I saw him. This revealed enterococcus as the organism in question and the pathology report again subsequently also showed nonviable bone with osteomyelitis. Again all  this couple together makes be concerned about what the  appropriate next treatment options should be at this point. The patient has seen his podiatrist last Thursday but it was felt that everything was doing "okay" and that he just need to continue with wound care. With that being said I'm somewhat leaning towards referring the patient for a second opinion in this regard with everything going on at this point. 05/21/18 on evaluation today patient actually appears to be doing about the same in regard to his ankle ulcer. I do not feel any necrotic bone or bone fragments in the wound opening I did clean this with saline and gauze as well as a Q-tip today I could not get the large head of the Q-tip in just the back in. Nonetheless he did have some bleeding which in my pinion is a good sign. Still I am somewhat concerned about the fact that he doesn't seem to be making quite as much progress as I would like to see and again I'm still concerned about the infection. He will be seeing infectious disease later this week. 06/04/18 on evaluation today patient presents for follow-up after his evaluation with the orthopedic specialist. Fortunately he seems to be doing very well in regard to the overall appearance of the wound upon evaluation and inspection today. I'm very pleased in this regard. With that being said I do not see any evidence of signs of active infection or lease worsening infection currently. He is still on the Augmentin currently. Overall the patient's pleased with how things seem to be progressing. So far there is no evidence of a surgical intervention that's been recommended for him based on what I'm hearing. 06/18/18 on evaluation today patient appears to be doing very well in regard to his lower extremity ulcer at the ankle. This in fact appears to be just a very small hole there's really no significant undermining of all I feel like he has done excellent since I last saw him. Fortunately there's no signs of active infection at this time. 07/09/18 on  evaluation today patient appears to be doing well in regard to his heel ulcer. He has been tolerating the dressing changes without complication. Really there's not much these able to pack into the hole at this time is far as the packing [place], [place]. ([id number]) stretches continue this is due to the fact that it's gotten so small. Nonetheless she's been able to utilize the [facility] which I think is the optimal and best thing at this point. No fevers, chills, nausea, or vomiting noted at this time. Electronic Signature(s) Signed: 07/09/2018 4:49:37 PM By: Worthy Keeler PA-C Entered By: Worthy Keeler on 07/09/2018 14:14:55 [place], Verlene Mayer ([id number]) -------------------------------------------------------------------------------- Physical Exam Details Patient Name: LUISDAVID, HAMBLIN. Date of Service: 07/09/2018 9:15 AM Medical Record Number: [id number] Patient Account Number: [id number] Date of Birth/Sex: 24-Jan-1946 (73 y.o. M) Treating RN: Harold Barban Primary Care Provider: Golden Pop Other Clinician: Referring Provider: Golden Pop Treating Provider/Extender: STONE III, HOYT Weeks in Treatment: 10 Constitutional Well-nourished and well-hydrated in no acute distress. Respiratory normal breathing without difficulty. clear to auscultation bilaterally. Cardiovascular regular rate and rhythm with normal S1, S2. Psychiatric this patient is able to make decisions and demonstrates good insight into disease process. Alert and Oriented x 3. pleasant and cooperative. Notes Upon evaluation inspection today patient's wound bed actually showed signs of good granulation which is excellent news. Fortunately there's no signs of active infection which is also good news. 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 Overall I think this is good for him  obviously to exercise. Electronic Signature(s) Signed: 07/09/2018 4:49:37 PM By: Worthy Keeler PA-C Entered By: Worthy Keeler on 07/09/2018 14:15:33 [place]um, Verlene Mayer ([id number]) -------------------------------------------------------------------------------- Physician Orders Details Patient Name: AHMERE, HEMENWAY. Date of Service: 07/09/2018 9:15 AM Medical Record Number: [id number] Patient Account Number: [id number] Date of Birth/Sex: Apr 08, 1945 (73 y.o. M) Treating RN: Harold Barban Primary Care Provider: Golden Pop Other Clinician: Referring Provider: Golden Pop Treating Provider/Extender: Melburn Hake, HOYT Weeks in Treatment: 10 Verbal / Phone Orders: No Diagnosis Coding Wound Cleansing Wound #1 Right,Lateral Calcaneus o Clean wound with Normal Saline. Primary Wound Dressing Wound #1 Right,Lateral Calcaneus o Silver Collagen - Apply collagen to wound bed every other day (small piece) Secondary Dressing Wound #1 Right,Lateral Calcaneus o Boardered Foam Dressing Dressing Change Frequency Wound #1 Right,Lateral Calcaneus o Change dressing every day. Follow-up Appointments Wound #1 Right,Lateral Calcaneus o Return Appointment in 3 weeks. Electronic Signature(s) Signed: 07/09/2018 4:49:37 PM By: Worthy Keeler PA-C Signed: 07/12/2018 8:05:03 AM By: Harold Barban Entered By: Harold Barban on 07/09/2018 09:36:31 DREYDEN, ROHRMAN ([id number]) -------------------------------------------------------------------------------- Problem List Details Patient Name: GARION, WEMPE. Date of Service: 07/09/2018 9:15 AM Medical Record Number: [id number] Patient Account Number: [id number] Date of Birth/Sex: 04-20-1945 (73 y.o. M) Treating RN: Harold Barban Primary Care Provider: Golden Pop Other Clinician: Referring Provider: Golden Pop Treating Provider/Extender: Melburn Hake, HOYT Weeks in Treatment: 10 Active Problems ICD-10 Evaluated Encounter Code  Description Active Date Today Diagnosis T81.31XA Disruption of external operation (surgical) wound, not 04/30/2018 No Yes elsewhere classified, initial encounter L97.414 Non-pressure chronic ulcer of right heel and midfoot with 04/30/2018 No Yes necrosis of bone Inactive Problems Resolved Problems Electronic Signature(s) Signed: 07/09/2018 4:49:37 PM By: Worthy Keeler PA-C Entered By: Worthy Keeler on 07/09/2018 09:59:14 [place], Verlene Mayer ([id number]) -------------------------------------------------------------------------------- Progress Note Details Patient Name: DEQUINCY, BORN. Date of Service: 07/09/2018 9:15 AM Medical Record Number: [id number] Patient Account Number: [id number] Date of Birth/Sex: January 06, 1946 (73 y.o. M) Treating RN: Harold Barban Primary Care Provider: Golden Pop Other Clinician: Referring Provider: Golden Pop Treating Provider/Extender: Melburn Hake, HOYT Weeks in Treatment: 10 Subjective Chief Complaint Information obtained from Patient Right heel surgical ulcer History of Present Illness (HPI) 06/29/18 on evaluation today patient presents for initial evaluation our clinic for an issue that actually began December 19, 2017. He was actually climbing a ladder with the latter gateway causing him to fall he sustained a significant fracture to his right ankle requiring open reduction internal fixation of the ankle. The patient is an avid biker and not only does he ride bikes but he also builds. It has been very difficult for him to be off of his bicycle during the four months he has been attempting to heal since the surgery. Unfortunately the reason he comes to me today is that he still has an area that just will not close along the surgical incision site that he requested a second opinion regarding with wound care from his podiatrist. The patient really has no significant medical history and even for his age appears to be excellent shape in my pinion. No  fevers, chills, nausea, or vomiting noted at this time. 05/07/2018 Seen today for follow up and management right foot wound s/p mechanical fall from ladder.  Prior to wound culture from wound center, he was being treated with penicillin 500 mg every 6 hours for group B strep of the wound and later changed to Augmentin 875 mg PO BID. On last wound care visit bone culture was obtained and it was positive for enterococcus faecalis and nonviable bone with Osteomyelitis of the left calcaneum. Infectious disease provider suspect infected hardware from communicated fracture of the calcaneum. Mr. Paxton is scheduled for CT scan this week to determine if bone has fused. There is no redness surrounding the wound today. Some areas of slough present on the base of the wound. Plan to collaborate with ID provider for antibiotic management. 05/14/18 on evaluation today patient actually appears to be doing better in regard to the overall appearance of the wound during evaluation today. With that being said we have undertaken the further testing that we had recommended here in the clinic when I first saw him. Again his CT scan which was performed show that there was a large defect in the central aspect of the calcaneus where there is no osteophyte material or bone graft identified. This measured approximately 2.5 x 2.1 cm. This is stated possibly be resort to changes or osteomyelitis again I'm unsure of exactly which we would be looking at here. Again the patient does have a prior bone culture which was obtained by myself as well during the first visit that I saw him. This revealed enterococcus as the organism in question and the pathology report again subsequently also showed nonviable bone with osteomyelitis. Again all this couple together makes be concerned about what the appropriate next treatment options should be at this point. The patient has seen his podiatrist last Thursday but it was felt that everything was  doing "okay" and that he just need to continue with wound care. With that being said I'm somewhat leaning towards referring the patient for a second opinion in this regard with everything going on at this point. 05/21/18 on evaluation today patient actually appears to be doing about the same in regard to his ankle ulcer. I do not feel any necrotic bone or bone fragments in the wound opening I did clean this with saline and gauze as well as a Q-tip today I could not get the large head of the Q-tip in just the back in. Nonetheless he did have some bleeding which in my pinion is a good sign. Still I am somewhat concerned about the fact that he doesn't seem to be making quite as much progress as I would like to see and again I'm still concerned about the infection. He will be seeing infectious disease later this week. 06/04/18 on evaluation today patient presents for follow-up after his evaluation with the orthopedic specialist. Fortunately he seems to be doing very well in regard to the overall appearance of the wound upon evaluation and inspection today. I'm very pleased in this regard. With that being said I do not see any evidence of signs of active infection or lease worsening infection currently. He is still on the Augmentin currently. Overall the patient's pleased with how things seem to be progressing. So far there is no evidence of a surgical intervention that's been recommended for him based on what I'm hearing. NIMAI, BURBACH ([id number]) 06/18/18 on evaluation today patient appears to be doing very well in regard to his lower extremity ulcer at the ankle. This in fact appears to be just a very small hole there's really no significant undermining of all I  feel like he has done excellent since I last saw him. Fortunately there's no signs of active infection at this time. 07/09/18 on evaluation today patient appears to be doing well in regard to his heel ulcer. He has been tolerating the  dressing changes without complication. Really there's not much these able to pack into the hole at this time is far as the packing stretches continue this is due to the fact that it's gotten so small. Nonetheless she's been able to utilize the [facility] which I think is the optimal and best thing at this point. No fevers, chills, nausea, or vomiting noted at this time. Patient History Information obtained from Patient. Family History Cancer - Mother, Heart Disease - Mother, Hypertension - Mother, No family history of Diabetes, Hereditary Spherocytosis, Kidney Disease, Lung Disease, Seizures, Stroke, Thyroid Problems, Tuberculosis. Social History Never smoker, Marital Status - Married, Alcohol Use - Never, Drug Use - No History, Caffeine Use - Daily. Medical History Eyes Denies history of Cataracts, Glaucoma, Optic Neuritis Ear/Nose/Mouth/Throat Denies history of Chronic sinus problems/congestion, Middle ear problems Hematologic/Lymphatic Denies history of Anemia, Hemophilia, Human Immunodeficiency Virus, Lymphedema, Sickle Cell Disease Respiratory Denies history of Aspiration, Asthma, Chronic Obstructive Pulmonary Disease (COPD), Pneumothorax, Sleep Apnea, Tuberculosis Cardiovascular Denies history of Angina, Arrhythmia, Congestive Heart Failure, Coronary Artery Disease, Deep Vein Thrombosis, Hypertension, Hypotension, Myocardial Infarction, Peripheral Arterial Disease, Peripheral Venous Disease, Phlebitis, Vasculitis Gastrointestinal Denies history of Cirrhosis , Colitis, Crohn s, Hepatitis A, Hepatitis B, Hepatitis C Endocrine Denies history of Type I Diabetes, Type II Diabetes Genitourinary Denies history of End Stage Renal Disease Immunological Denies history of Lupus Erythematosus, Raynaud s, Scleroderma Integumentary (Skin) Denies history of History of Burn, History of pressure wounds Musculoskeletal Denies history of Gout, Rheumatoid Arthritis, Osteoarthritis,  Osteomyelitis Neurologic Denies history of Dementia, Neuropathy, Quadriplegia, Paraplegia, Seizure Disorder Oncologic Denies history of Received Chemotherapy, Received Radiation Psychiatric Denies history of Anorexia/bulimia, Confinement Anxiety Medical And Surgical History Notes Genitourinary BPH Review of Systems (ROS) Constitutional Symptoms (General Health) CHANCEY, RINGEL ([id number]) Denies complaints or symptoms of Fatigue, Fever, Chills, Marked Weight Change. Respiratory Denies complaints or symptoms of Chronic or frequent coughs, Shortness of Breath. Cardiovascular Denies complaints or symptoms of Chest pain, LE edema. Psychiatric Denies complaints or symptoms of Anxiety, Claustrophobia. Objective Constitutional Well-nourished and well-hydrated in no acute distress. Vitals Time Taken: 9:19 AM, Height: 67 in, Weight: 148.1 lbs, BMI: 23.2, Temperature: 97.7 F, Pulse: 55 bpm, Respiratory Rate: 16 breaths/min, Blood Pressure: 118/59 mmHg. Respiratory normal breathing without difficulty. clear to auscultation bilaterally. Cardiovascular regular rate and rhythm with normal S1, S2. Psychiatric this patient is able to make decisions and demonstrates good insight into disease process. Alert and Oriented x 3. pleasant and cooperative. General Notes: Upon evaluation inspection today patient's wound bed actually showed signs of good granulation which is excellent news. Fortunately there's no signs of active infection which is also good news. It seems to be making good progress he's very active he tells me that he has biked several thousand miles in April and plans to keep going since he was with the spot we competed. Overall I think this is good for him obviously to exercise. Integumentary (Hair, Skin) Wound #1 status is Open. Original cause of wound was Surgical Injury. The wound is located on the Right,Lateral Calcaneus. The wound measures 0.2cm length x 0.3cm width x 0.7cm  depth; 0.047cm^2 area and 0.033cm^3 volume. There is Fat Layer (Subcutaneous Tissue) Exposed exposed. There is no tunneling or undermining noted. There is  a medium amount of serous drainage noted. The wound margin is flat and intact. There is large (67-100%) pink granulation within the wound bed. There is a small (1-33%) amount of necrotic tissue within the wound bed including Adherent Slough. The periwound skin appearance exhibited: Scarring, Maceration. The periwound skin appearance did not exhibit: Callus, Crepitus, Excoriation, Induration, Rash, Dry/Scaly, Atrophie Blanche, Cyanosis, Ecchymosis, Hemosiderin Staining, Mottled, Pallor, Rubor, Erythema. Periwound temperature was noted as No Abnormality. The periwound has tenderness on palpation. 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 Primary Wound Dressing: Wound #1 Right,Lateral Calcaneus: Silver Collagen - Apply collagen to wound bed every other day (small piece) Secondary Dressing: Wound #1 Right,Lateral Calcaneus: Boardered Foam Dressing Dressing Change Frequency: Wound #1 Right,Lateral Calcaneus: Change dressing every day. Follow-up Appointments: Wound #1 Right,Lateral Calcaneus: Return Appointment in 3 weeks. I'm a recommend currently that we continue with the above wound to measures for the next week and the patient is in agreement with plan. We will subsequently see were things that at follow-up. If anything changes worsens meantime he will contact the office let me know. Please see above for specific wound care orders. We will see patient for re-evaluation in 3 week(s) here in the clinic. If anything worsens or changes patient will contact our  office for additional recommendations. Electronic Signature(s) Signed: 07/09/2018 4:49:37 PM By: Worthy Keeler PA-C Entered By: Worthy Keeler on 07/09/2018 14:15:57 [place], Verlene Mayer ([id number]) -------------------------------------------------------------------------------- ROS/PFSH Details Patient Name: HUBERT, RAATZ. Date of Service: 07/09/2018 9:15 AM Medical Record Number: [id number] Patient Account Number: [id number] Date of Birth/Sex: 17-Sep-1945 (73 y.o. M) Treating RN: Harold Barban Primary Care Provider: Golden Pop Other Clinician: Referring Provider: Golden Pop Treating Provider/Extender: Melburn Hake, HOYT Weeks in Treatment: 10 Information Obtained From Patient Constitutional Symptoms (General Health) Complaints and Symptoms: Negative for: Fatigue; Fever; Chills; Marked Weight Change Respiratory Complaints and Symptoms: Negative for: Chronic or frequent coughs; Shortness of Breath Medical History: Negative for: Aspiration; Asthma; Chronic Obstructive Pulmonary Disease (COPD); Pneumothorax; Sleep Apnea; Tuberculosis Cardiovascular Complaints and Symptoms: Negative for: Chest pain; LE edema Medical History: Negative for: Angina; Arrhythmia; Congestive Heart Failure; Coronary Artery Disease; Deep Vein Thrombosis; Hypertension; Hypotension; Myocardial Infarction; Peripheral Arterial Disease; Peripheral Venous Disease; Phlebitis; Vasculitis Psychiatric Complaints and Symptoms: Negative for: Anxiety; Claustrophobia Medical History: Negative for: Anorexia/bulimia; Confinement Anxiety Eyes Medical History: Negative for: Cataracts; Glaucoma; Optic Neuritis Ear/Nose/Mouth/Throat Medical History: Negative for: Chronic sinus problems/congestion; Middle ear problems Hematologic/Lymphatic Medical History: Negative for: Anemia; Hemophilia; Human Immunodeficiency Virus; Lymphedema; Sickle Cell Disease JUNIOR, HUEZO ([id number]) Gastrointestinal Medical  History: Negative for: Cirrhosis ; Colitis; Crohnos; Hepatitis A; Hepatitis B; Hepatitis C Endocrine Medical History: Negative for: Type I Diabetes; Type II Diabetes Genitourinary Medical History: Negative for: End Stage Renal Disease Past Medical History Notes: BPH Immunological Medical History: Negative for: Lupus Erythematosus; Raynaudos; Scleroderma Integumentary (Skin) Medical History: Negative for: History of Burn; History of pressure wounds Musculoskeletal Medical History: Negative for: Gout; Rheumatoid Arthritis; Osteoarthritis; Osteomyelitis Neurologic Medical History: Negative for: Dementia; Neuropathy; Quadriplegia; Paraplegia; Seizure Disorder Oncologic Medical History: Negative for: Received Chemotherapy; Received Radiation Immunizations Pneumococcal Vaccine: Received Pneumococcal Vaccination: Yes Immunization Notes: up to date Implantable Devices None Family and Social History Cancer: Yes - Mother; Diabetes: No; Heart Disease: Yes - Mother; Hereditary Spherocytosis: No; Hypertension: Yes -  Mother; Kidney Disease: No; Lung Disease: No; Seizures: No; Stroke: No; Thyroid Problems: No; Tuberculosis: No; Never smoker; Marital Status - Married; Alcohol Use: Never; Drug Use: No History; Caffeine Use: Daily; Financial Concerns: No; Food, Clothing or Shelter Needs: No; Support System Lacking: No; Transportation Concerns: No Physician Affirmation I have reviewed and agree with the above information. KAIDEN, PECH ([id number]) Electronic Signature(s) Signed: 07/09/2018 4:49:37 PM By: Worthy Keeler PA-C Signed: 07/12/2018 8:05:03 AM By: Harold Barban Entered By: Worthy Keeler on 07/09/2018 14:15:11 MASSON, NALEPA ([id number]) -------------------------------------------------------------------------------- SuperBill Details Patient Name: GABRIELA, GIANNELLI. Date of Service: 07/09/2018 Medical Record Number: [id number] Patient Account Number: [id number] Date of  Birth/Sex: 11-02-1945 (73 y.o. 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

## 2018-07-12 NOTE — Progress Notes (Signed)
Jose, [name] ([id number]) Visit Report for 07/09/2018 Arrival Information Details Patient Name: Jose, [name]. Date of Service: 07/09/2018 9:15 AM Medical Record Number: [id number] Patient Account Number: [id number] Date of Birth/Sex: [birth year] (73 y.o. M) Treating RN: Harold Barban Primary Care Jose Decuir: Golden Pop Other Clinician: Referring Dannae Kato: Golden Pop Treating [place] Latulippe/Extender: Melburn Hake, HOYT Weeks in Treatment: 10 Visit Information History Since Last Visit Added or deleted any medications: No Patient Arrived: Ambulatory Any new allergies or adverse reactions: No Arrival Time: 09:18 Had a fall or experienced change in No Accompanied By: self activities of daily living that may affect Transfer Assistance: None risk of falls: Patient Identification Verified: Yes Signs or symptoms of abuse/neglect since last visito No Secondary Verification Process Completed: Yes Hospitalized since last visit: No Implantable device outside of the clinic excluding No cellular tissue based products placed in the center since last visit: Has Dressing in Place as Prescribed: Yes Pain Present Now: No Electronic Signature(s) Signed: 07/10/2018 4:17:56 PM By: Lorine Bears RCP, RRT, CHT Entered By: Lorine Bears on 07/09/2018 09:19:15 Jose [name] ([id number]) -------------------------------------------------------------------------------- Clinic Level of Care Assessment Details Patient Name: Jose, [name]. Date of Service: 07/09/2018 9:15 AM Medical Record Number: [id number] Patient Account Number: [id number] Date of Birth/Sex: August 15, 1945 (73 y.o. M) Treating RN: Harold Barban Primary Care Aadin Gaut: Golden Pop Other Clinician: Referring Darlene Brozowski: Golden Pop Treating Babak Lucus/Extender: Melburn Hake, HOYT Weeks in Treatment: 10 Clinic Level of Care Assessment Items TOOL 4 Quantity Score []  - Use when only an EandM is performed on  FOLLOW-UP visit 0 ASSESSMENTS - Nursing Assessment / Reassessment X - Reassessment of Co-morbidities (includes updates in patient status) 1 10 X- 1 5 Reassessment of Adherence to Treatment Plan ASSESSMENTS - Wound and Skin Assessment / Reassessment X - Simple Wound Assessment / Reassessment - one wound 1 5 []  - 0 Complex Wound Assessment / Reassessment - multiple wounds []  - 0 Dermatologic / Skin Assessment (not related to wound area) ASSESSMENTS - Focused Assessment []  - Circumferential Edema Measurements - multi extremities 0 []  - 0 Nutritional Assessment / Counseling / Intervention []  - 0 Lower Extremity Assessment (monofilament, tuning fork, pulses) []  - 0 Peripheral Arterial Disease Assessment (using hand held doppler) ASSESSMENTS - Ostomy and/or Continence Assessment and Care []  - Incontinence Assessment and Management 0 []  - 0 Ostomy Care Assessment and Management (repouching, etc.) PROCESS - Coordination of Care X - Simple Patient / Family Education for ongoing care 1 15 []  - 0 Complex (extensive) Patient / Family Education for ongoing care []  - 0 Staff obtains Programmer, systems, Records, Test Results / Process Orders []  - 0 Staff telephones HHA, Nursing Homes / Clarify orders / etc []  - 0 Routine Transfer to another Facility (non-emergent condition) []  - 0 Routine Hospital Admission (non-emergent condition) []  - 0 New Admissions / Biomedical engineer / Ordering NPWT, Apligraf, etc. []  - 0 Emergency Hospital Admission (emergent condition) X- 1 10 Simple Discharge Coordination Jose, [name]. ([id number]) []  - 0 Complex (extensive) Discharge Coordination PROCESS - Special Needs []  - Pediatric / Minor Patient Management 0 []  - 0 Isolation Patient Management []  - 0 Hearing / Language / Visual special needs []  - 0 Assessment of Community assistance (transportation, D/C planning, etc.) []  - 0 Additional assistance / Altered mentation []  - 0 Support Surface(s)  Assessment (bed, cushion, seat, etc.) INTERVENTIONS - Wound Cleansing / Measurement X - Simple Wound Cleansing - one wound 1 5 []  - 0 Complex Wound Cleansing - multiple wounds  X- 1 5 Wound Imaging (photographs - any number of wounds) []  - 0 Wound Tracing (instead of photographs) X- 1 5 Simple Wound Measurement - one wound []  - 0 Complex Wound Measurement - multiple wounds INTERVENTIONS - Wound Dressings X - Small Wound Dressing one or multiple wounds 1 10 []  - 0 Medium Wound Dressing one or multiple wounds []  - 0 Large Wound Dressing one or multiple wounds []  - 0 Application of Medications - topical []  - 0 Application of Medications - injection INTERVENTIONS - Miscellaneous []  - External ear exam 0 []  - 0 Specimen Collection (cultures, biopsies, blood, body fluids, etc.) []  - 0 Specimen(s) / Culture(s) sent or taken to Lab for analysis []  - 0 Patient Transfer (multiple staff / Civil Service fast streamer / Similar devices) []  - 0 Simple Staple / Suture removal (25 or less) []  - 0 Complex Staple / Suture removal (26 or more) []  - 0 Hypo / Hyperglycemic Management (close monitor of Blood Glucose) []  - 0 Ankle / Brachial Index (ABI) - do not check if billed separately X- 1 5 Vital Signs [name], [name] ([id number]) Has the patient been seen at the hospital within the last three years: Yes Total Score: 75 Level Of Care: New/Established - Level 2 Electronic Signature(s) Signed: 07/12/2018 8:05:03 AM By: Harold Barban Entered By: Harold Barban on 07/09/2018 09:35:57 Jose [name] ([id number]) -------------------------------------------------------------------------------- Encounter Discharge Information Details Patient Name: Jose, [name]. Date of Service: 07/09/2018 9:15 AM Medical Record Number: [id number] Patient Account Number: [id number] Date of Birth/Sex: 03-14-1945 (73 y.o. M) Treating RN: Harold Barban Primary Care Jw Covin: Golden Pop Other Clinician: Referring  Ahmia Colford: Golden Pop Treating Karey Stucki/Extender: Melburn Hake, HOYT Weeks in Treatment: 10 Encounter Discharge Information Items Discharge Condition: Stable Ambulatory Status: Ambulatory Discharge Destination: Home Transportation: Private Auto Accompanied By: self Schedule Follow-up Appointment: Yes Clinical Summary of Care: Electronic Signature(s) Signed: 07/12/2018 8:05:03 AM By: Harold Barban Entered By: Harold Barban on 07/09/2018 09:37:21 Jose [name] ([id number]) -------------------------------------------------------------------------------- Lower Extremity Assessment Details Patient Name: Jose, [name]. Date of Service: 07/09/2018 9:15 AM Medical Record Number: [id number] Patient Account Number: [id number] Date of Birth/Sex: 08-Oct-1945 (73 y.o. M) Treating RN: Montey Hora Primary Care Dashanti Burr: Golden Pop Other Clinician: Referring Tieshia Rettinger: Golden Pop Treating Purva Vessell/Extender: STONE III, HOYT Weeks in Treatment: 10 Edema Assessment Assessed: [Left: No] [Right: No] Edema: [Left: N] [Right: o] Vascular Assessment Pulses: Dorsalis Pedis Palpable: [Right:Yes] Electronic Signature(s) Signed: 07/09/2018 3:28:19 PM By: Montey Hora Entered By: Montey Hora on 07/09/2018 09:27:00 Jose [name] ([id number]) -------------------------------------------------------------------------------- Multi Wound Chart Details Patient Name: Jose, [name]. Date of Service: 07/09/2018 9:15 AM Medical Record Number: [id number] Patient Account Number: [id number] Date of Birth/Sex: [birth year] (73 y.o. M) Treating RN: Harold Barban Primary Care Rillie Riffel: Golden Pop Other Clinician: Referring Vallie Fayette: Golden Pop Treating Izola Teague/Extender: STONE III, HOYT Weeks in Treatment: 10 Vital Signs Height(in): 67 Pulse(bpm): 55 Weight(lbs): 148.1 Blood Pressure(mmHg): 118/59 Body Mass Index(BMI): 23 Temperature(F): 97.7 Respiratory  Rate 16 (breaths/min): Photos: [N/A:N/A] Wound Location: Right Calcaneus - Lateral N/A N/A Wounding Event: Surgical Injury N/A N/A Primary Etiology: Open Surgical Wound N/A N/A Date Acquired: 12/29/2017 N/A N/A Weeks of Treatment: 10 N/A N/A Wound Status: Open N/A N/A Measurements L x W x D 0.2x0.3x0.7 N/A N/A (cm) Area (cm) : 0.047 N/A N/A Volume (cm) : 0.033 N/A N/A % Reduction in Area: 80.10% N/A N/A % Reduction in Volume: 88.30% N/A N/A Classification: Full Thickness Without N/A N/A Exposed Support Structures Exudate Amount: Medium N/A N/A Exudate  Type: Serous N/A N/A Exudate Color: amber N/A N/A Wound Margin: Flat and Intact N/A N/A Granulation Amount: Large (67-100%) N/A N/A Granulation Quality: Pink N/A N/A Necrotic Amount: Small (1-33%) N/A N/A Exposed Structures: Fat Layer (Subcutaneous N/A N/A Tissue) Exposed: Yes Fascia: No Tendon: No Muscle: No Joint: No Bone: No Epithelialization: None N/A N/A KNOLAN, SIMIEN. ([id number]) Periwound Skin Texture: Scarring: Yes N/A N/A Excoriation: No Induration: No Callus: No Crepitus: No Rash: No Periwound Skin Moisture: Maceration: Yes N/A N/A Dry/Scaly: No Periwound Skin Color: Atrophie Blanche: No N/A N/A Cyanosis: No Ecchymosis: No Erythema: No Hemosiderin Staining: No Mottled: No Pallor: No Rubor: No Temperature: No Abnormality N/A N/A Tenderness on Palpation: Yes N/A N/A Treatment Notes Electronic Signature(s) Signed: 07/12/2018 8:05:03 AM By: Harold Barban Entered By: Harold Barban on 07/09/2018 09:32:12 Jose [name] ([id number]) -------------------------------------------------------------------------------- [facility] Details Patient Name: Jose, [name]. Date of Service: 07/09/2018 9:15 AM Medical Record Number: [id number] Patient Account Number: [id number] Date of Birth/Sex: Nov 30, 1945 (73 y.o. M) Treating RN: Harold Barban Primary Care Geo Slone: Golden Pop  Other Clinician: Referring Chasten Blaze: Golden Pop Treating Adah Stoneberg/Extender: Melburn Hake, HOYT Weeks in Treatment: 10 Active Inactive Wound/Skin Impairment Nursing Diagnoses: Impaired tissue integrity Knowledge deficit related to ulceration/compromised skin integrity Goals: Ulcer/skin breakdown will have a volume reduction of 30% by week 4 Date Initiated: 04/30/2018 Target Resolution Date: 05/29/2018 Goal Status: Active Interventions: Assess patient/caregiver ability to obtain necessary supplies Assess patient/caregiver ability to perform ulcer/skin care regimen upon admission and as needed Assess ulceration(s) every visit Provide education on ulcer and skin care Notes: Electronic Signature(s) Signed: 07/12/2018 8:05:03 AM By: Harold Barban Entered By: Harold Barban on 07/09/2018 09:31:41 Jose [name] ([id number]) -------------------------------------------------------------------------------- Pain Assessment Details Patient Name: Jose, [name]. Date of Service: 07/09/2018 9:15 AM Medical Record Number: [id number] Patient Account Number: [id number] Date of Birth/Sex: [birth year] (73 y.o. M) Treating RN: Harold Barban Primary Care Aziya Arena: Golden Pop Other Clinician: Referring Lechelle Wrigley: Golden Pop Treating Junita Kubota/Extender: Melburn Hake, HOYT Weeks in Treatment: 10 Active Problems Location of Pain Severity and Description of Pain Patient Has Paino No Site Locations Pain Management and Medication Current Pain Management: Electronic Signature(s) Signed: 07/10/2018 4:17:56 PM By: Lorine Bears RCP, RRT, CHT Signed: 07/12/2018 8:05:03 AM By: Harold Barban Entered By: Lorine Bears on 07/09/2018 09:19:24 Jose [name] ([id number]) -------------------------------------------------------------------------------- Patient/Caregiver Education Details Patient Name: Jose, [name]. Date of Service: 07/09/2018 9:15 AM Medical Record  Number: [id number] Patient Account Number: [id number] Date of Birth/Gender: June 19, 1945 (73 y.o. M) Treating RN: Harold Barban Primary Care Physician: Golden Pop Other Clinician: Referring Physician: Golden Pop Treating Physician/Extender: Sharalyn Ink in Treatment: 10 Education Assessment Education Provided To: Patient Education Topics Provided Wound/Skin Impairment: Handouts: Caring for Your Ulcer Methods: Demonstration, Explain/Verbal Responses: State content correctly Electronic Signature(s) Signed: 07/12/2018 8:05:03 AM By: Harold Barban Entered By: Harold Barban on 07/09/2018 09:32:28 Jose [name] ([id number]) -------------------------------------------------------------------------------- Wound Assessment Details Patient Name: Jose, [name]. Date of Service: 07/09/2018 9:15 AM Medical Record Number: [id number] Patient Account Number: [id number] Date of Birth/Sex: 01-02-1946 (73 y.o. M) Treating RN: Montey Hora Primary Care Maylee Bare: Golden Pop Other Clinician: Referring Dantae Meunier: Golden Pop Treating Nekeisha Aure/Extender: STONE III, HOYT Weeks in Treatment: 10 Wound Status Wound Number: 1 Primary Etiology: Open Surgical Wound Wound Location: Right Calcaneus - Lateral Wound Status: Open Wounding Event: Surgical Injury Date Acquired: 12/29/2017 Weeks Of Treatment: 10 Clustered Wound: No Photos Wound Measurements Length: (cm) 0.2 Width: (cm) 0.3 Depth: (cm) 0.7 Area: (cm) 0.047  Volume: (cm) 0.033 % Reduction in Area: 80.1% % Reduction in Volume: 88.3% Epithelialization: None Tunneling: No Undermining: No Wound Description Full Thickness Without Exposed Support Foul Odo Classification: Structures Slough/F Wound Margin: Flat and Intact Exudate Medium Amount: Exudate Type: Serous Exudate Color: amber r After Cleansing: No ibrino Yes Wound Bed Granulation Amount: Large (67-100%) Exposed Structure Granulation Quality:  Pink Fascia Exposed: No Necrotic Amount: Small (1-33%) Fat Layer (Subcutaneous Tissue) Exposed: Yes Necrotic Quality: Adherent Slough Tendon Exposed: No Muscle Exposed: No Joint Exposed: No Bone Exposed: No Periwound Skin Texture Jose, [name]. ([id number]) Texture Color No Abnormalities Noted: No No Abnormalities Noted: No Callus: No Atrophie Blanche: No Crepitus: No Cyanosis: No Excoriation: No Ecchymosis: No Induration: No Erythema: No Rash: No Hemosiderin Staining: No Scarring: Yes Mottled: No Pallor: No Moisture Rubor: No No Abnormalities Noted: No Dry / Scaly: No Temperature / Pain Maceration: Yes Temperature: No Abnormality Tenderness on Palpation: Yes Treatment Notes Wound #1 (Right, Lateral Calcaneus) Notes prisma,bandaid Electronic Signature(s) Signed: 07/09/2018 3:28:19 PM By: Montey Hora Entered By: Montey Hora on 07/09/2018 09:26:32 Jose [name] ([id number]) -------------------------------------------------------------------------------- Vitals Details Patient Name: Jose, [name]. Date of Service: 07/09/2018 9:15 AM Medical Record Number: [id number] Patient Account Number: [id number] Date of Birth/Sex: [birth year] (73 y.o. M) Treating RN: Harold Barban Primary Care Cianna Kasparian: Golden Pop Other Clinician: Referring Caralina Nop: Golden Pop Treating Tonio Seider/Extender: STONE III, HOYT Weeks in Treatment: 10 Vital Signs Time Taken: 09:19 Temperature (F): 97.7 Height (in): 67 Pulse (bpm): 55 Weight (lbs): 148.1 Respiratory Rate (breaths/min): 16 Body Mass Index (BMI): 23.2 Blood Pressure (mmHg): 118/59 Reference Range: 80 - 120 mg / dl Electronic Signature(s) Signed: 07/10/2018 4:17:56 PM By: Lorine Bears RCP, RRT, CHT Entered By: Lorine Bears on 07/09/2018 09:22:21

## 2018-07-16 ENCOUNTER — Ambulatory Visit (INDEPENDENT_AMBULATORY_CARE_PROVIDER_SITE_OTHER): Payer: PPO

## 2018-07-16 ENCOUNTER — Other Ambulatory Visit: Payer: Self-pay

## 2018-07-16 VITALS — BP 148/59 | HR 62 | Ht 67.0 in | Wt 137.0 lb

## 2018-07-16 DIAGNOSIS — Z0000 Encounter for general adult medical examination without abnormal findings: Secondary | ICD-10-CM

## 2018-07-16 NOTE — Patient Instructions (Signed)
Jose [name] , Thank you for taking time to come for your Medicare Wellness Visit. I appreciate your ongoing commitment to your health goals. Please review the following plan we discussed and let me know if I can assist you in the future.   Screening recommendations/referrals: Colonoscopy: completed 03/2011 Recommended yearly ophthalmology/optometry visit for glaucoma screening and checkup Recommended yearly dental visit for hygiene and checkup  Vaccinations: Influenza vaccine: declined Pneumococcal vaccine: declined Tdap vaccine: up to date Shingles vaccine: shingrix eligible, check with your insurance or the [place] for coverage   Advanced directives: Please bring a copy of your health care power of attorney and living will to the office at your convenience.  Conditions/risks identified: recommend increasing water intake.   Next appointment: Follow up in one year for your annual wellness exam.   Preventive Care 65 Years and Older, Male Preventive care refers to lifestyle choices and visits with your health care provider that can promote health and wellness. What does preventive care include?  A yearly physical exam. This is also called an annual well check.  Dental exams once or twice a year.  Routine eye exams. Ask your health care provider how often you should have your eyes checked.  Personal lifestyle choices, including:  Daily care of your teeth and gums.  Regular physical activity.  Eating a healthy diet.  Avoiding tobacco and drug use.  Limiting alcohol use.  Practicing safe sex.  Taking low doses of aspirin every day.  Taking vitamin and mineral supplements as recommended by your health care provider. What happens during an annual well check? The services and screenings done by your health care provider during your annual well check will depend on your age, overall health, lifestyle risk factors, and family history of disease. Counseling  Your health care provider  may ask you questions about your:  Alcohol use.  Tobacco use.  Drug use.  Emotional well-being.  Home and relationship well-being.  Sexual activity.  Eating habits.  History of falls.  Memory and ability to understand (cognition).  Work and work Statistician. Screening  You may have the following tests or measurements:  Height, weight, and BMI.  Blood pressure.  Lipid and cholesterol levels. These may be checked every 5 years, or more frequently if you are over 63 years old.  Skin check.  Lung cancer screening. You may have this screening every year starting at age 28 if you have a 30-pack-year history of smoking and currently smoke or have quit within the past 15 years.  Fecal occult blood test (FOBT) of the stool. You may have this test every year starting at age 74.  Flexible sigmoidoscopy or colonoscopy. You may have a sigmoidoscopy every 5 years or a colonoscopy every 10 years starting at age 34.  Prostate cancer screening. Recommendations will vary depending on your family history and other risks.  Hepatitis C blood test.  Hepatitis B blood test.  Sexually transmitted disease (STD) testing.  Diabetes screening. This is done by checking your blood sugar (glucose) after you have not eaten for a while (fasting). You may have this done every 1-3 years.  Abdominal aortic aneurysm (AAA) screening. You may need this if you are a current or former smoker.  Osteoporosis. You may be screened starting at age 27 if you are at high risk. Talk with your health care provider about your test results, treatment options, and if necessary, the need for more tests. Vaccines  Your health care provider may recommend certain vaccines, such  as:  Influenza vaccine. This is recommended every year.  Tetanus, diphtheria, and acellular pertussis (Tdap, Td) vaccine. You may need a Td booster every 10 years.  Zoster vaccine. You may need this after age 27.  Pneumococcal 13-valent  conjugate (PCV13) vaccine. One dose is recommended after age 101.  Pneumococcal polysaccharide (PPSV23) vaccine. One dose is recommended after age 1. Talk to your health care provider about which screenings and vaccines you need and how often you need them. This information is not intended to replace advice given to you by your health care provider. Make sure you discuss any questions you have with your health care provider. Document Released: 03/20/2015 Document Revised: 11/11/2015 Document Reviewed: 12/23/2014 Elsevier Interactive Patient Education  2017 [place] Prevention in the Home Falls can cause injuries. They can happen to people of all ages. There are many things you can do to make your home safe and to help prevent falls. What can I do on the outside of my home?  Regularly fix the edges of walkways and driveways and fix any cracks.  Remove anything that might make you trip as you walk through a door, such as a raised step or threshold.  Trim any bushes or trees on the path to your home.  Use bright outdoor lighting.  Clear any walking paths of anything that might make someone trip, such as rocks or tools.  Regularly check to see if handrails are loose or broken. Make sure that both sides of any steps have handrails.  Any raised decks and porches should have guardrails on the edges.  Have any leaves, snow, or ice cleared regularly.  Use sand or salt on walking paths during winter.  Clean up any spills in your garage right away. This includes oil or grease spills. What can I do in the bathroom?  Use night lights.  Install grab bars by the toilet and in the tub and shower. Do not use towel bars as grab bars.  Use non-skid mats or decals in the tub or shower.  If you need to sit down in the shower, use a plastic, non-slip stool.  Keep the floor dry. Clean up any water that spills on the floor as soon as it happens.  Remove soap buildup in the tub or  shower regularly.  Attach bath mats securely with double-sided non-slip rug tape.  Do not have throw rugs and other things on the floor that can make you trip. What can I do in the bedroom?  Use night lights.  Make sure that you have a light by your bed that is easy to reach.  Do not use any sheets or blankets that are too big for your bed. They should not hang down onto the floor.  Have a firm chair that has side arms. You can use this for support while you get dressed.  Do not have throw rugs and other things on the floor that can make you trip. What can I do in the kitchen?  Clean up any spills right away.  Avoid walking on wet floors.  Keep items that you use a lot in easy-to-reach places.  If you need to reach something above you, use a strong step stool that has a grab bar.  Keep electrical cords out of the way.  Do not use floor polish or wax that makes floors slippery. If you must use wax, use non-skid floor wax.  Do not have throw rugs and other things on the  floor that can make you trip. What can I do with my stairs?  Do not leave any items on the stairs.  Make sure that there are handrails on both sides of the stairs and use them. Fix handrails that are broken or loose. Make sure that handrails are as long as the stairways.  Check any carpeting to make sure that it is firmly attached to the stairs. Fix any carpet that is loose or worn.  Avoid having throw rugs at the top or bottom of the stairs. If you do have throw rugs, attach them to the floor with carpet tape.  Make sure that you have a light switch at the top of the stairs and the bottom of the stairs. If you do not have them, ask someone to add them for you. What else can I do to help prevent falls?  Wear shoes that:  Do not have high heels.  Have rubber bottoms.  Are comfortable and fit you well.  Are closed at the toe. Do not wear sandals.  If you use a stepladder:  Make sure that it is fully  opened. Do not climb a closed stepladder.  Make sure that both sides of the stepladder are locked into place.  Ask someone to hold it for you, if possible.  Clearly mark and make sure that you can see:  Any grab bars or handrails.  First and last steps.  Where the edge of each step is.  Use tools that help you move around (mobility aids) if they are needed. These include:  Canes.  Walkers.  Scooters.  Crutches.  Turn on the lights when you go into a dark area. Replace any light bulbs as soon as they burn out.  Set up your furniture so you have a clear path. Avoid moving your furniture around.  If any of your floors are uneven, fix them.  If there are any pets around you, be aware of where they are.  Review your medicines with your doctor. Some medicines can make you feel dizzy. This can increase your chance of falling. Ask your doctor what other things that you can do to help prevent falls. This information is not intended to replace advice given to you by your health care provider. Make sure you discuss any questions you have with your health care provider. Document Released: 12/18/2008 Document Revised: 07/30/2015 Document Reviewed: 03/28/2014 Elsevier Interactive Patient Education  2017 [employer].

## 2018-07-16 NOTE — Progress Notes (Signed)
Subjective:   Jose [name] is a 73 y.o. male who presents for Medicare Annual/Subsequent preventive examination.  This visit is being conducted via phone call  - after an attmept to do on video chat - due to the COVID-19 pandemic. This patient has given me verbal consent via phone to conduct this visit, patient states they are participating from their home address. Some vital signs may be absent or patient reported.   Patient identification: identified by name, DOB, and current address.    Review of Systems:    Cardiac Risk Factors include: advanced age (>31men, >6 women);male gender     Objective:    Vitals: BP (!) 148/59 Comment: patient reported  Pulse 62   Ht 5\' 7"  (1.702 m) Comment: patient reported  Wt 137 lb (62.1 kg) Comment: patient reported  BMI 21.46 kg/m   Body mass index is 21.46 kg/m.  Advanced Directives 07/16/2018 12/29/2017 12/27/2017 03/03/2017 02/28/2017  Does Patient Have a Medical Advance Directive? No No Yes Yes Yes  Type of Advance Directive Living will;Healthcare Power of [place];Living will  Does patient want to make changes to medical advance directive? - No - Patient declined - - No - Patient declined  Copy of [place] in Chart? No - copy requested - - - No - copy requested  Would patient like information on creating a medical advance directive? - No - Patient declined - - -    Tobacco Social History   Tobacco Use  Smoking Status Former Smoker  . Years: 4.00  . Types: Cigarettes  . Last attempt to quit: 02/10/1969  . Years since quitting: 49.4  Smokeless Tobacco Never Used     Counseling given: Not Answered   Clinical Intake:  Pre-visit preparation completed: Yes  Pain : No/denies pain     Nutritional Status: BMI of 19-24  Normal Nutritional Risks: None Diabetes: No  How often do you need to have someone help you when you read instructions, pamphlets, or other written  materials from your doctor or pharmacy?: 1 - Never What is the last grade level you completed in school?: associates   Interpreter Needed?: No  Information entered by :: Tiffany Hill,LPN   Past Medical History:  Diagnosis Date  . Asthma    as a child  . Basal cell carcinoma (BCC) of scalp 08/2015  . Collapsed lung 02/2017  . Family history of adverse reaction to anesthesia    brother-PT UNSURE BUT WILL FIND OUT REACTION  . GERD (gastroesophageal reflux disease)    occ  . Osteoporosis    Past Surgical History:  Procedure Laterality Date  . broken collar bone  2002/2007  . FRACTURE SURGERY  2007   broken left thigh bone  . FRACTURE SURGERY  2007   broken left upper arm  . HERNIA REPAIR    . LEG SURGERY    . ORIF CALCANEOUS FRACTURE Right 12/29/2017   Procedure: OPEN REDUCTION INTERNAL FIXATION (ORIF) CALCANEOUS FRACTURE;  Surgeon: Samara Deist, DPM;  Location: [facility] ORS;  Service: Podiatry;  Laterality: Right;   Family History  Problem Relation Age of Onset  . Congestive Heart Failure Mother   . Cancer Paternal Grandfather        leukemia   Social History   Socioeconomic History  . Marital status: Widowed    Spouse name: Not on file  . Number of children: Not on file  . Years of education: 2 associate degreee   .  Highest education level: Associate degree: academic program  Occupational History  . Occupation: retired   Scientific laboratory technician  . Financial resource strain: Not hard at all  . Food insecurity:    Worry: Never true    Inability: Never true  . Transportation needs:    Medical: No    Non-medical: No  Tobacco Use  . Smoking status: Former Smoker    Years: 4.00    Types: Cigarettes    Last attempt to quit: 02/10/1969    Years since quitting: 49.4  . Smokeless tobacco: Never Used  Substance and Sexual Activity  . Alcohol use: No  . Drug use: No  . Sexual activity: Not on file  Lifestyle  . Physical activity:    Days per week: 4 days    Minutes per  session: 60 min  . Stress: Not at all  Relationships  . Social connections:    Talks on phone: More than three times a week    Gets together: More than three times a week    Attends religious service: Never    Active member of club or organization: Yes    Attends meetings of clubs or organizations: More than 4 times per year    Relationship status: Widowed  Other Topics Concern  . Not on file  Social History Narrative  . Not on file    Outpatient Encounter Medications as of 07/16/2018  Medication Sig  . amoxicillin-clavulanate (AUGMENTIN) 875-125 MG tablet Take 1 tablet by mouth 2 (two) times daily.  . calcium-vitamin D (OSCAL WITH D) 500-200 MG-UNIT tablet Take 1 tablet by mouth.  . docusate sodium (COLACE) 100 MG capsule Take 200 mg by mouth at bedtime.  . finasteride (PROSCAR) 5 MG tablet Take 1 tablet (5 mg total) by mouth daily. (Patient taking differently: Take 5 mg by mouth at bedtime. )  . Multiple Vitamin (MULTIVITAMIN) tablet Take 1 tablet by mouth daily.  . [DISCONTINUED] Cholecalciferol (VITAMIN D3) 5000 units CAPS Take 10,000 Units by mouth daily.  . [DISCONTINUED] OVER THE COUNTER MEDICATION 1 tablet as needed. GEOSUILL (ACID REFLUX MED)  . [DISCONTINUED] acetaminophen (TYLENOL) 500 MG tablet Take 2 tablets (1,000 mg total) by mouth every 8 (eight) hours. (Patient not taking: Reported on 07/16/2018)   No facility-administered encounter medications on file as of 07/16/2018.     Activities of Daily Living In your present state of health, do you have any difficulty performing the following activities: 07/16/2018 12/29/2017  Hearing? N N  Vision? N N  Difficulty concentrating or making decisions? N N  Walking or climbing stairs? N Y  Dressing or bathing? N N  Doing errands, shopping? N N  Preparing Food and eating ? N -  Using the Toilet? N -  In the past six months, have you accidently leaked urine? N -  Do you have problems with loss of bowel control? N -  Managing  your Medications? N -  Managing your Finances? N -  Housekeeping or managing your Housekeeping? N -  Some recent data might be hidden    Patient Care Team: Guadalupe Maple, MD as PCP - General (Family Medicine) [place] More[place], PA-C as Physician Assistant (Physician Assistant) Samara Deist, DPM as Referring Physician (Podiatry)   Assessment:   This is a routine wellness examination for [facility].  Exercise Activities and Dietary recommendations Current Exercise Habits: Home exercise routine, Time (Minutes): 60, Frequency (Times/Week): 4, Weekly Exercise (Minutes/Week): 240, Intensity: Mild, Exercise limited by: orthopedic condition(s)  Goals    . DIET - INCREASE WATER INTAKE     Recommend drinking at least 6-8 glasses of water a day.        Fall Risk: Fall Risk  07/16/2018 07/05/2017 07/04/2016  Falls in the past year? 0 Yes No  Number falls in past yr: - 1 -  Injury with Fall? - Yes -  Follow up - Falls evaluation completed -    FALL RISK PREVENTION PERTAINING TO THE HOME:  Any stairs in or around the home? Steps at front of house  If so, are there any without handrails? N/a   Home free of loose throw rugs in walkways, pet beds, electrical cords, etc? Yes  Adequate lighting in your home to reduce risk of falls? Yes   ASSISTIVE DEVICES UTILIZED TO PREVENT FALLS:  Life alert? No  Use of a cane, walker or w/c? No  Grab bars in the bathroom? No  Shower chair or bench in shower? No  Elevated toilet seat or a handicapped toilet? No   TIMED UP AND GO:  Unable to perform   Depression Screen PHQ 2/9 Scores 07/16/2018 07/05/2017 07/04/2016  PHQ - 2 Score 0 0 0    Cognitive Function     6CIT Screen 07/16/2018  What Year? 0 points  What month? 0 points  What time? 0 points  Count back from 20 0 points  Months in reverse 0 points  Repeat phrase 0 points  Total Score 0    Immunization History  Administered Date(s) Administered  . Tdap 03/07/2013    Qualifies  for Shingles Vaccine? Yes . Due for Shingrix. Education has been provided regarding the importance of this vaccine. Pt has been advised to call insurance company to determine out of pocket expense. Advised may also receive vaccine at local pharmacy or Health Dept. Verbalized acceptance and understanding.   Tdap: up to date   Flu Vaccine: Due for Flu vaccine. Does the patient want to receive this vaccine today?  No . Education has been provided regarding the importance of this vaccine but still declined. Advised may receive this vaccine at local pharmacy or Health Dept. Aware to provide a copy of the vaccination record if obtained from local pharmacy or Health Dept. Verbalized acceptance and understanding.  Pneumococcal Vaccine: Due for Pneumococcal vaccine. Does the patient want to receive this vaccine today?  No . Education has been provided regarding the importance of this vaccine but still declined. Advised may receive this vaccine at local pharmacy or Health Dept. Aware to provide a copy of the vaccination record if obtained from local pharmacy or Health Dept. Verbalized acceptance and understanding.   Screening Tests Health Maintenance  Topic Date Due  . PNA vac Low Risk Adult (1 of 2 - PCV13) 07/16/2019 (Originally 01/23/2010)  . INFLUENZA VACCINE  10/06/2018  . COLONOSCOPY  03/07/2021  . TETANUS/TDAP  03/08/2023  . Hepatitis C Screening  Completed   Cancer Screenings:  Colorectal Screening: Completed 03/2011 . Repeat every 10 years  Lung Cancer Screening: (Low Dose CT Chest recommended if Age 51-80 years, 30 pack-year currently smoking OR have quit w/in 15years.) does not qualify.    Additional Screening:  Hepatitis C Screening: does qualify; Completed 07/04/2016  Vision Screening: Recommended annual ophthalmology exams for early detection of glaucoma and other disorders of the eye. Is the patient up to date with their annual eye exam?  Yes  Who is the provider or what is the name  of the office in  which the pt attends annual eye exams? [facility]    Dental Screening: Recommended annual dental exams for proper oral hygiene  Community Resource Referral:  CRR required this visit?  No        Plan:  I have personally reviewed and addressed the Medicare Annual Wellness questionnaire and have noted the following in the patient's chart:  A. Medical and social history B. Use of alcohol, tobacco or illicit drugs  C. Current medications and supplements D. Functional ability and status E.  Nutritional status F.  Physical activity G. Advance directives H. List of other physicians I.  Hospitalizations, surgeries, and ER visits in previous 12 months J.  [place] such as hearing and vision if needed, cognitive and depression L. Referrals and appointments   In addition, I have reviewed and discussed with patient certain preventive protocols, quality metrics, and best practice recommendations. A written personalized care plan for preventive services as well as general preventive health recommendations were provided to patient via [employer],   Bevelyn Ngo, LPN  11/19/7827 Nurse Health Advisor   Nurse Notes: none

## 2018-07-19 DIAGNOSIS — S92011D Displaced fracture of body of right calcaneus, subsequent encounter for fracture with routine healing: Secondary | ICD-10-CM | POA: Diagnosis not present

## 2018-07-31 ENCOUNTER — Ambulatory Visit: Payer: PPO | Admitting: Physician Assistant

## 2018-08-02 ENCOUNTER — Other Ambulatory Visit: Payer: Self-pay

## 2018-08-02 ENCOUNTER — Encounter: Payer: PPO | Admitting: Physician Assistant

## 2018-08-02 DIAGNOSIS — S82891S Other fracture of right lower leg, sequela: Secondary | ICD-10-CM | POA: Diagnosis not present

## 2018-08-02 DIAGNOSIS — T8189XA Other complications of procedures, not elsewhere classified, initial encounter: Secondary | ICD-10-CM | POA: Diagnosis not present

## 2018-08-02 DIAGNOSIS — L97412 Non-pressure chronic ulcer of right heel and midfoot with fat layer exposed: Secondary | ICD-10-CM | POA: Diagnosis not present

## 2018-08-08 NOTE — Progress Notes (Signed)
ULAS, ZUERCHER ([id number]) Visit Report for 08/02/2018 Arrival Information Details Patient Name: Jose [name], Jose [name]. Date of Service: 08/02/2018 9:00 AM Medical Record Number: [id number] Patient Account Number: [id number] Date of Birth/Sex: January 31, 1946 (73 y.o. M) Treating RN: Cornell Barman Primary Care Kassia Demarinis: Golden Pop Other Clinician: Referring Rokia Bosket: Golden Pop Treating Arling Cerone/Extender: Melburn Hake, HOYT Weeks in Treatment: 13 Visit Information History Since Last Visit Added or deleted any medications: Yes Patient Arrived: Ambulatory Any new allergies or adverse reactions: No Arrival Time: 08:40 Had a fall or experienced change in No Accompanied By: self activities of daily living that may affect Transfer Assistance: None risk of falls: Patient Identification Verified: Yes Signs or symptoms of abuse/neglect since last visito No Secondary Verification Process Completed: Yes Hospitalized since last visit: No Implantable device outside of the clinic excluding No cellular tissue based products placed in the center since last visit: Has Dressing in Place as Prescribed: Yes Pain Present Now: No Electronic Signature(s) Signed: 08/02/2018 1:43:11 PM By: Lorine Bears RCP, RRT, CHT Entered By: Lorine Bears on 08/02/2018 08:41:14 [name] ([id number]) -------------------------------------------------------------------------------- Clinic Level of Care Assessment Details Patient Name: [name], [name]. Date of Service: 08/02/2018 9:00 AM Medical Record Number: [id number] Patient Account Number: [id number] Date of Birth/Sex: 1946-02-23 (73 y.o. M) Treating RN: Cornell Barman Primary Care Katera Rybka: Golden Pop Other Clinician: Referring Allice Garro: Golden Pop Treating Clarke Amburn/Extender: Melburn Hake, HOYT Weeks in Treatment: 13 Clinic Level of Care Assessment Items TOOL 4 Quantity Score []  - Use when only an EandM is performed on  FOLLOW-UP visit 0 ASSESSMENTS - Nursing Assessment / Reassessment []  - Reassessment of Co-morbidities (includes updates in patient status) 0 X- 1 5 Reassessment of Adherence to Treatment Plan ASSESSMENTS - Wound and Skin Assessment / Reassessment X - Simple Wound Assessment / Reassessment - one wound 1 5 []  - 0 Complex Wound Assessment / Reassessment - multiple wounds []  - 0 Dermatologic / Skin Assessment (not related to wound area) ASSESSMENTS - Focused Assessment []  - Circumferential Edema Measurements - multi extremities 0 []  - 0 Nutritional Assessment / Counseling / Intervention []  - 0 Lower Extremity Assessment (monofilament, tuning fork, pulses) []  - 0 Peripheral Arterial Disease Assessment (using hand held doppler) ASSESSMENTS - Ostomy and/or Continence Assessment and Care []  - Incontinence Assessment and Management 0 []  - 0 Ostomy Care Assessment and Management (repouching, etc.) PROCESS - Coordination of Care X - Simple Patient / Family Education for ongoing care 1 15 []  - 0 Complex (extensive) Patient / Family Education for ongoing care []  - 0 Staff obtains Programmer, systems, Records, Test Results / Process Orders []  - 0 Staff telephones HHA, Nursing Homes / Clarify orders / etc []  - 0 Routine Transfer to another Facility (non-emergent condition) []  - 0 Routine Hospital Admission (non-emergent condition) []  - 0 New Admissions / Biomedical engineer / Ordering NPWT, Apligraf, etc. []  - 0 Emergency Hospital Admission (emergent condition) X- 1 10 Simple Discharge Coordination TALI, COSTER. ([id number]) []  - 0 Complex (extensive) Discharge Coordination PROCESS - Special Needs []  - Pediatric / Minor Patient Management 0 []  - 0 Isolation Patient Management []  - 0 Hearing / Language / Visual special needs []  - 0 Assessment of Community assistance (transportation, D/C planning, etc.) []  - 0 Additional assistance / Altered mentation []  - 0 Support Surface(s)  Assessment (bed, cushion, seat, etc.) INTERVENTIONS - Wound Cleansing / Measurement X - Simple Wound Cleansing - one wound 1 5 []  - 0 Complex Wound Cleansing - multiple wounds X-  1 5 Wound Imaging (photographs - any number of wounds) []  - 0 Wound Tracing (instead of photographs) X- 1 5 Simple Wound Measurement - one wound []  - 0 Complex Wound Measurement - multiple wounds INTERVENTIONS - Wound Dressings []  - Small Wound Dressing one or multiple wounds 0 []  - 0 Medium Wound Dressing one or multiple wounds []  - 0 Large Wound Dressing one or multiple wounds []  - 0 Application of Medications - topical []  - 0 Application of Medications - injection INTERVENTIONS - Miscellaneous []  - External ear exam 0 []  - 0 Specimen Collection (cultures, biopsies, blood, body fluids, etc.) []  - 0 Specimen(s) / Culture(s) sent or taken to Lab for analysis []  - 0 Patient Transfer (multiple staff / Civil Service fast streamer / Similar devices) []  - 0 Simple Staple / Suture removal (25 or less) []  - 0 Complex Staple / Suture removal (26 or more) []  - 0 Hypo / Hyperglycemic Management (close monitor of Blood Glucose) []  - 0 Ankle / Brachial Index (ABI) - do not check if billed separately X- 1 5 Vital Signs KESHUN, BERRETT. ([id number]) Has the patient been seen at the hospital within the last three years: Yes Total Score: 55 Level Of Care: New/Established - Level 2 Electronic Signature(s) Signed: 08/07/2018 5:54:26 PM By: Gretta Cool, BSN, RN, CWS, Kim RN, BSN Entered By: Gretta Cool, BSN, RN, CWS, Kim on 08/02/2018 Herman, [place]. ([id number]) -------------------------------------------------------------------------------- Encounter Discharge Information Details Patient Name: [name], [name]. Date of Service: 08/02/2018 9:00 AM Medical Record Number: [id number] Patient Account Number: [id number] Date of Birth/Sex: 01-03-1946 (73 y.o. M) Treating RN: Cornell Barman Primary Care Roseland Braun: Golden Pop Other  Clinician: Referring Faatimah Spielberg: Golden Pop Treating Aretha Levi/Extender: Melburn Hake, HOYT Weeks in Treatment: 13 Encounter Discharge Information Items Discharge Condition: Stable Ambulatory Status: Ambulatory Discharge Destination: Home Transportation: Private Auto Accompanied By: self Schedule Follow-up Appointment: No Clinical Summary of Care: Electronic Signature(s) Signed: 08/07/2018 5:54:26 PM By: Gretta Cool, BSN, RN, CWS, Kim RN, BSN Entered By: Gretta Cool, BSN, RN, CWS, Kim on 08/02/2018 09:01:26 [name] ([id number]) -------------------------------------------------------------------------------- Lower Extremity Assessment Details Patient Name: CLEDIS, SOHN. Date of Service: 08/02/2018 9:00 AM Medical Record Number: [id number] Patient Account Number: [id number] Date of Birth/Sex: [birth year] (73 y.o. M) Treating RN: Montey Hora Primary Care Rosabella Edgin: Golden Pop Other Clinician: Referring Dalanie Kisner: Golden Pop Treating Mercedes Fort/Extender: STONE III, HOYT Weeks in Treatment: 13 Edema Assessment Assessed: [Left: No] [Right: No] Edema: [Left: N] [Right: o] Vascular Assessment Pulses: Dorsalis Pedis Palpable: [Right:Yes] Electronic Signature(s) Signed: 08/02/2018 3:16:01 PM By: Montey Hora Entered By: Montey Hora on 08/02/2018 08:52:54 Maddalena, Verlene Mayer ([id number]) -------------------------------------------------------------------------------- Multi Wound Chart Details Patient Name: ODIES, DESA. Date of Service: 08/02/2018 9:00 AM Medical Record Number: [id number] Patient Account Number: [id number] Date of Birth/Sex: 01/08/1946 (73 y.o. M) Treating RN: Cornell Barman Primary Care Chauncey Bruno: Golden Pop Other Clinician: Referring Thaddius Manes: Golden Pop Treating Katharin Schneider/Extender: STONE III, HOYT Weeks in Treatment: 13 Vital Signs Height(in): 67 Pulse(bpm): 58 Weight(lbs): 148.1 Blood Pressure(mmHg): 134/60 Body Mass Index(BMI):  23 Temperature(F): 97.8 Respiratory Rate 16 (breaths/min): Photos: [N/A:N/A] Wound Location: Right, Lateral Calcaneus N/A N/A Wounding Event: Surgical Injury N/A N/A Primary Etiology: Open Surgical Wound N/A N/A Date Acquired: 12/29/2017 N/A N/A Weeks of Treatment: 13 N/A N/A Wound Status: Healed - Epithelialized N/A N/A Measurements L x W x D 0x0x0 N/A N/A (cm) Area (cm) : 0 N/A N/A Volume (cm) : 0 N/A N/A % Reduction in Area: 100.00% N/A N/A % Reduction in Volume: 100.00% N/A N/A  Classification: Full Thickness Without N/A N/A Exposed Support Structures Exudate Amount: Medium N/A N/A Exudate Type: Serous N/A N/A Exudate Color: amber N/A N/A Wound Margin: Flat and Intact N/A N/A Granulation Amount: Large (67-100%) N/A N/A Granulation Quality: Pink N/A N/A Necrotic Amount: Small (1-33%) N/A N/A Exposed Structures: Fat Layer (Subcutaneous N/A N/A Tissue) Exposed: Yes Fascia: No Tendon: No Muscle: No Joint: No Bone: No Epithelialization: None N/A N/A EFOSA, TREICHLER ([id number]) Treatment Notes Electronic Signature(s) Signed: 08/07/2018 5:54:26 PM By: Gretta Cool, BSN, RN, CWS, Kim RN, BSN Entered By: Gretta Cool, BSN, RN, CWS, Kim on 08/02/2018 08:58:37 [name] ([id number]) -------------------------------------------------------------------------------- [place] Details Patient Name: RISHIK, TUBBY. Date of Service: 08/02/2018 9:00 AM Medical Record Number: [id number] Patient Account Number: [id number] Date of Birth/Sex: 07/05/1945 (73 y.o. M) Treating RN: Cornell Barman Primary Care Nima Bamburg: Golden Pop Other Clinician: Referring Bretta Fees: Golden Pop Treating Arlan Birks/Extender: Sharalyn Ink in Treatment: 13 Active Inactive Electronic Signature(s) Signed: 08/07/2018 5:54:26 PM By: Gretta Cool, BSN, RN, CWS, Kim RN, BSN Entered By: Gretta Cool, BSN, RN, CWS, Kim on 08/02/2018 08:58:28 [name]  ([id number]) -------------------------------------------------------------------------------- Pain Assessment Details Patient Name: YER, OLIVENCIA. Date of Service: 08/02/2018 9:00 AM Medical Record Number: [id number] Patient Account Number: [id number] Date of Birth/Sex: 1945/11/12 (73 y.o. M) Treating RN: Cornell Barman Primary Care Drey Shaff: Golden Pop Other Clinician: Referring Orrie Schubert: Golden Pop Treating Joyclyn Plazola/Extender: Melburn Hake, HOYT Weeks in Treatment: 13 Active Problems Location of Pain Severity and Description of Pain Patient Has Paino No Site Locations Pain Management and Medication Current Pain Management: Electronic Signature(s) Signed: 08/02/2018 1:43:11 PM By: Lorine Bears RCP, RRT, CHT Signed: 08/07/2018 5:54:26 PM By: Gretta Cool, BSN, RN, CWS, Kim RN, BSN Entered By: Lorine Bears on 08/02/2018 08:41:22 [name] ([id number]) -------------------------------------------------------------------------------- Patient/Caregiver Education Details Patient Name: OWENS, HARA. Date of Service: 08/02/2018 9:00 AM Medical Record Number: [id number] Patient Account Number: [id number] Date of Birth/Gender: February 02, 1946 (73 y.o. M) Treating RN: Cornell Barman Primary Care Physician: Golden Pop Other Clinician: Referring Physician: Golden Pop Treating Physician/Extender: Sharalyn Ink in Treatment: 13 Education Assessment Education Provided To: Patient Education Topics Provided Wound/Skin Impairment: Handouts: Other: cover for protection Methods: Demonstration, Explain/Verbal Responses: State content correctly Electronic Signature(s) Signed: 08/07/2018 5:54:26 PM By: Gretta Cool, BSN, RN, CWS, Kim RN, BSN Entered By: Gretta Cool, BSN, RN, CWS, Kim on 08/02/2018 09:00:55 [name] ([id number]) -------------------------------------------------------------------------------- Wound Assessment Details Patient Name: SHAWON, DENZER. Date of Service: 08/02/2018 9:00 AM Medical Record Number: [id number] Patient Account Number: [id number] Date of Birth/Sex: 1945-06-04 (73 y.o. M) Treating RN: Cornell Barman Primary Care Jaxson Anglin: Golden Pop Other Clinician: Referring Masayoshi Couzens: Golden Pop Treating Eldean Klatt/Extender: Melburn Hake, HOYT Weeks in Treatment: 13 Wound Status Wound Number: 1 Primary Etiology: Open Surgical Wound Wound Location: Right, Lateral Calcaneus Wound Status: Healed - Epithelialized Wounding Event: Surgical Injury Date Acquired: 12/29/2017 Weeks Of Treatment: 13 Clustered Wound: No Photos Wound Measurements Length: (cm) 0 % Reduct Width: (cm) 0 % Reduct Depth: (cm) 0 Epitheli Area: (cm) 0 Tunneli Volume: (cm) 0 Undermi ion in Area: 100% ion in Volume: 100% alization: None ng: No ning: No Wound Description Full Thickness Without Exposed Support Foul Odo Classification: Structures Slough/F Wound Margin: Flat and Intact Exudate Medium Amount: Exudate Type: Serous Exudate Color: amber r After Cleansing: No ibrino Yes Wound Bed Granulation Amount: Large (67-100%) Exposed Structure Granulation Quality: Pink Fascia Exposed: No Necrotic Amount: Small (1-33%) Fat Layer (Subcutaneous Tissue) Exposed: Yes Necrotic Quality: Adherent Slough Tendon  Exposed: No Muscle Exposed: No Joint Exposed: No Bone Exposed: No YOSHIAKI, KREUSER ([id number]) Electronic Signature(s) Signed: 08/07/2018 5:54:26 PM By: Gretta Cool, BSN, RN, CWS, Kim RN, BSN Entered By: Gretta Cool, BSN, RN, CWS, Kim on 08/02/2018 08:56:39 [name] ([id number]) -------------------------------------------------------------------------------- [place] Details Patient Name: GARFIELD, COINER. Date of Service: 08/02/2018 9:00 AM Medical Record Number: [id number] Patient Account Number: [id number] Date of Birth/Sex: 1945/10/19 (73 y.o. M) Treating RN: Cornell Barman Primary Care Govanni Plemons: Golden Pop Other Clinician: Referring  Hyde Sires: Golden Pop Treating Marilynne Dupuis/Extender: Melburn Hake, HOYT Weeks in Treatment: 13 Vital Signs Time Taken: 08:41 Temperature (F): 97.8 Height (in): 67 Pulse (bpm): 58 Weight (lbs): 148.1 Respiratory Rate (breaths/min): 16 Body Mass Index (BMI): 23.2 Blood Pressure (mmHg): 134/60 Reference Range: 80 - 120 mg / dl Electronic Signature(s) Signed: 08/02/2018 1:43:11 PM By: Lorine Bears RCP, RRT, CHT Entered By: Lorine Bears on 08/02/2018 08:44:35

## 2018-08-08 NOTE — Progress Notes (Signed)
Jose [name] ([id number]) Visit Report for 08/02/2018 Chief Complaint Document Details Patient Name: Jose [name], Jose [name]. Date of Service: 08/02/2018 9:00 AM Medical Record Number: [id number] Patient Account Number: [id number] Date of Birth/Sex: Nov 13, 1945 (73 y.o. M) Treating RN: Cornell Barman Primary Care Provider: Golden Pop Other Clinician: Referring Provider: Golden Pop Treating Provider/Extender: Melburn Hake, Ginnifer Creelman Weeks in Treatment: 13 Information Obtained from: Patient Chief Complaint Right heel surgical ulcer Electronic Signature(s) Signed: 08/02/2018 4:36:20 PM By: Worthy Keeler PA-C Entered By: Worthy Keeler on 08/02/2018 08:53:41 Jose [name] ([id number]) -------------------------------------------------------------------------------- HPI Details Patient Name: Jose [name], Jose [name]. Date of Service: 08/02/2018 9:00 AM Medical Record Number: [id number] Patient Account Number: [id number] Date of Birth/Sex: [birth year] (73 y.o. M) Treating RN: Cornell Barman Primary Care Provider: Golden Pop Other Clinician: Referring Provider: Golden Pop Treating Provider/Extender: Melburn Hake, Tamzin Bertling Weeks in Treatment: 13 History of Present Illness HPI Description: 06/29/18 on evaluation today patient presents for initial evaluation our clinic for an issue that actually began December 19, 2017. He was actually climbing a ladder with the latter gateway causing him to fall he sustained a significant fracture to his right ankle requiring open reduction internal fixation of the ankle. The patient is an avid biker and not only does he ride bikes but he also builds. It has been very difficult for him to be off of his bicycle during the four months he has been attempting to heal since the surgery. Unfortunately the reason he comes to me today is that he still has an area that just will not close along the surgical incision site that he requested a second opinion regarding with wound care from  his podiatrist. The patient really has no significant medical history and even for his age appears to be excellent shape in my pinion. No fevers, chills, nausea, or vomiting noted at this time. 05/07/2018 Seen today for follow up and management right foot wound s/p mechanical fall from ladder. Prior to wound culture from wound center, he was being treated with penicillin 500 mg every 6 hours for group B strep of the wound and later changed to Augmentin 875 mg PO BID. On last wound care visit bone culture was obtained and it was positive for enterococcus faecalis and nonviable bone with Osteomyelitis of the left calcaneum. Infectious disease provider suspect infected hardware from communicated fracture of the calcaneum. Mr. Main is scheduled for CT scan this week to determine if bone has fused. There is no redness surrounding the wound today. Some areas of slough present on the base of the wound. Plan to collaborate with ID provider for antibiotic management. 05/14/18 on evaluation today patient actually appears to be doing better in regard to the overall appearance of the wound during evaluation today. With that being said we have undertaken the further testing that we had recommended here in the clinic when I first saw him. Again his CT scan which was performed show that there was a large defect in the central aspect of the calcaneus where there is no osteophyte material or bone graft identified. This measured approximately 2.5 x 2.1 cm. This is stated possibly be resort to changes or osteomyelitis again I'm unsure of exactly which we would be looking at here. Again the patient does have a prior bone culture which was obtained by myself as well during the first visit that I saw him. This revealed enterococcus as the organism in question and the pathology report again subsequently also showed nonviable bone with osteomyelitis. Again all  this couple together makes be concerned about what the  appropriate next treatment options should be at this point. The patient has seen his podiatrist last Thursday but it was felt that everything was doing "okay" and that he just need to continue with wound care. With that being said I'm somewhat leaning towards referring the patient for a second opinion in this regard with everything going on at this point. 05/21/18 on evaluation today patient actually appears to be doing about the same in regard to his ankle ulcer. I do not feel any necrotic bone or bone fragments in the wound opening I did clean this with saline and gauze as well as a Q-tip today I could not get the large head of the Q-tip in just the back in. Nonetheless he did have some bleeding which in my pinion is a good sign. Still I am somewhat concerned about the fact that he doesn't seem to be making quite as much progress as I would like to see and again I'm still concerned about the infection. He will be seeing infectious disease later this week. 06/04/18 on evaluation today patient presents for follow-up after his evaluation with the orthopedic specialist. Fortunately he seems to be doing very well in regard to the overall appearance of the wound upon evaluation and inspection today. I'm very pleased in this regard. With that being said I do not see any evidence of signs of active infection or lease worsening infection currently. He is still on the Augmentin currently. Overall the patient's pleased with how things seem to be progressing. So far there is no evidence of a surgical intervention that's been recommended for him based on what I'm hearing. 06/18/18 on evaluation today patient appears to be doing very well in regard to his lower extremity ulcer at the ankle. This in fact appears to be just a very small hole there's really no significant undermining of all I feel like he has done excellent since I last saw him. Fortunately there's no signs of active infection at this time. 07/09/18 on  evaluation today patient appears to be doing well in regard to his heel ulcer. He has been tolerating the dressing changes without complication. Really there's not much these able to pack into the hole at this time is far as the packing [place], [place] K. ([id number]) stretches continue this is due to the fact that it's gotten so small. Nonetheless she's been able to utilize the [facility] which I think is the optimal and best thing at this point. No fevers, chills, nausea, or vomiting noted at this time. 08/02/18 on evaluation today patient actually appears to be doing excellent in regard to his right lateral ankle ulcer. In fact this appears to be completely healed at this point. He has had no drainage and states that the dressing he quit changes seem to get hard and pops out whenever he changes the dressing. Electronic Signature(s) Signed: 08/02/2018 4:36:20 PM By: Worthy Keeler PA-C Entered By: Worthy Keeler on 08/02/2018 09:05:58 Jose [name], Jose [name] ([id number]) -------------------------------------------------------------------------------- Physical Exam Details Patient Name: Jose [name], Jose [name]. Date of Service: 08/02/2018 9:00 AM Medical Record Number: [id number] Patient Account Number: [id number] Date of Birth/Sex: [birth year] (73 y.o. M) Treating RN: Cornell Barman Primary Care Provider: Golden Pop Other Clinician: Referring Provider: Golden Pop Treating Provider/Extender: STONE III, Platon Arocho Weeks in Treatment: 64 Constitutional Well-nourished and well-hydrated in no acute distress. Respiratory normal breathing without difficulty. Psychiatric this patient is able to make decisions and demonstrates good insight  into disease process. Alert and Oriented x 3. pleasant and cooperative. Notes Patient's wound bed currently showed signs of good epithelialization at this time. Overall I see no evidence of drainage, erythema, or any evidence of infection actively at this time. Hopefully this is a  good sign and he is likely completely healed and will stay so going forward. Electronic Signature(s) Signed: 08/02/2018 4:36:20 PM By: Worthy Keeler PA-C Entered By: Worthy Keeler on 08/02/2018 09:06:29 [name] ([id number]) -------------------------------------------------------------------------------- Physician Orders Details Patient Name: Jose [name], Jose [name]. Date of Service: 08/02/2018 9:00 AM Medical Record Number: [id number] Patient Account Number: [id number] Date of Birth/Sex: 1945-08-22 (73 y.o. 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 Cover for protection. Electronic Signature(s) Signed: 08/02/2018 4:36:20 PM By: Worthy Keeler PA-C Signed: 08/07/2018 5:54:26 PM By: Gretta Cool, BSN, RN, CWS, Kim RN, BSN Entered By: Gretta Cool, BSN, RN, CWS, Kim on 08/02/2018 08:59:21 DERIUS, GHOSH ([id number]) -------------------------------------------------------------------------------- Problem List Details Patient Name: Jose [name], Jose [name]. Date of Service: 08/02/2018 9:00 AM Medical Record Number: [id number] Patient Account Number: [id number] Date of Birth/Sex: [birth year] (73 y.o. M) Treating RN: Cornell Barman Primary Care Provider: Golden Pop Other Clinician: Referring Provider: Golden Pop Treating Provider/Extender: Melburn Hake, [place] Merry Weeks in Treatment: 13 Active Problems ICD-10 Evaluated Encounter Code Description Active Date Today Diagnosis T81.31XA Disruption of external operation (surgical)  wound, not 04/30/2018 No Yes elsewhere classified, initial encounter L97.414 Non-pressure chronic ulcer of right heel and midfoot with 04/30/2018 No Yes necrosis of bone Inactive Problems Resolved Problems Electronic Signature(s) Signed: 08/02/2018 4:36:20 PM By: Worthy Keeler PA-C Entered By: Worthy Keeler on 08/02/2018 08:53:36 Lack, Verlene [name] ([id number]) -------------------------------------------------------------------------------- Progress Note Details Patient Name: Jose [name], Jose [name]. Date of Service: 08/02/2018 9:00 AM Medical Record Number: [id number] Patient Account Number: [id number] Date of Birth/Sex: 12/19/1945 (73 y.o. M) Treating RN: Cornell Barman Primary Care Provider: Golden Pop Other Clinician: Referring Provider: Golden Pop Treating Provider/Extender: Melburn Hake, Davi Rotan Weeks in Treatment: 13 Subjective Chief Complaint Information obtained from Patient Right heel surgical ulcer History of Present Illness (HPI) 06/29/18 on evaluation today patient presents for initial evaluation our clinic for an issue that actually began December 19, 2017. He was actually climbing a ladder with the latter gateway causing him to fall he sustained a significant fracture to his right ankle requiring open reduction internal fixation of the ankle. The patient is an avid biker and not only does he ride bikes but he also builds. It has been very difficult for him to be off of his bicycle during the four months he has been attempting to heal since the surgery. Unfortunately the reason he comes to me today is that he still has an area that just will not close along the surgical incision site that he requested a second opinion regarding with wound care from his podiatrist. The patient really has no significant medical history and even for his age appears to be excellent shape in my pinion. No fevers, chills, nausea, or vomiting noted at this time. 05/07/2018 Seen today for follow up and  management right foot wound s/p mechanical fall from ladder.  Prior to wound culture from wound center, he was being treated with penicillin 500 mg every 6 hours for group B strep of the wound and later changed to Augmentin 875 mg PO BID. On last wound care visit bone culture was obtained and it was positive for enterococcus faecalis and nonviable bone with Osteomyelitis of the left calcaneum. Infectious disease provider suspect infected hardware from communicated fracture of the calcaneum. Mr. Hixon is scheduled for CT scan this week to determine if bone has fused. There is no redness surrounding the wound today. Some areas of slough present on the base of the wound. Plan to collaborate with ID provider for antibiotic management. 05/14/18 on evaluation today patient actually appears to be doing better in regard to the overall appearance of the wound during evaluation today. With that being said we have undertaken the further testing that we had recommended here in the clinic when I first saw him. Again his CT scan which was performed show that there was a large defect in the central aspect of the calcaneus where there is no osteophyte material or bone graft identified. This measured approximately 2.5 x 2.1 cm. This is stated possibly be resort to changes or osteomyelitis again I'm unsure of exactly which we would be looking at here. Again the patient does have a prior bone culture which was obtained by myself as well during the first visit that I saw him. This revealed enterococcus as the organism in question and the pathology report again subsequently also showed nonviable bone with osteomyelitis. Again all this couple together makes be concerned about what the appropriate next treatment options should be at this point. The patient has seen his podiatrist last Thursday but it was felt that everything was doing "okay" and that he just need to continue with wound care. With that being said I'm somewhat  leaning towards referring the patient for a second opinion in this regard with everything going on at this point. 05/21/18 on evaluation today patient actually appears to be doing about the same in regard to his ankle ulcer. I do not feel any necrotic bone or bone fragments in the wound opening I did clean this with saline and gauze as well as a Q-tip today I could not get the large head of the Q-tip in just the back in. Nonetheless he did have some bleeding which in my pinion is a good sign. Still I am somewhat concerned about the fact that he doesn't seem to be making quite as much progress as I would like to see and again I'm still concerned about the infection. He will be seeing infectious disease later this week. 06/04/18 on evaluation today patient presents for follow-up after his evaluation with the orthopedic specialist. Fortunately he seems to be doing very well in regard to the overall appearance of the wound upon evaluation and inspection today. I'm very pleased in this regard. With that being said I do not see any evidence of signs of active infection or lease worsening infection currently. He is still on the Augmentin currently. Overall the patient's pleased with how things seem to be progressing. So far there is no evidence of a surgical intervention that's been recommended for him based on what I'm hearing. Jose [name], Jose [name] ([id number]) 06/18/18 on evaluation today patient appears to be doing very well in regard to his lower extremity ulcer at the ankle. This in fact appears to be just a very small hole there's really no significant undermining of all I  feel like he has done excellent since I last saw him. Fortunately there's no signs of active infection at this time. 07/09/18 on evaluation today patient appears to be doing well in regard to his heel ulcer. He has been tolerating the dressing changes without complication. Really there's not much these able to pack into the hole at this  time is far as the packing stretches continue this is due to the fact that it's gotten so small. Nonetheless she's been able to utilize the [facility] which I think is the optimal and best thing at this point. No fevers, chills, nausea, or vomiting noted at this time. 08/02/18 on evaluation today patient actually appears to be doing excellent in regard to his right lateral ankle ulcer. In fact this appears to be completely healed at this point. He has had no drainage and states that the dressing he quit changes seem to get hard and pops out whenever he changes the dressing. Patient History Information obtained from Patient. Family History Cancer - Mother, Heart Disease - Mother, Hypertension - Mother, No family history of Diabetes, Hereditary Spherocytosis, Kidney Disease, Lung Disease, Seizures, Stroke, Thyroid Problems, Tuberculosis. Social History Never smoker, Marital Status - Married, Alcohol Use - Never, Drug Use - No History, Caffeine Use - Daily. Medical History Eyes Denies history of Cataracts, Glaucoma, Optic Neuritis Ear/Nose/Mouth/Throat Denies history of Chronic sinus problems/congestion, Middle ear problems Hematologic/Lymphatic Denies history of Anemia, Hemophilia, Human Immunodeficiency Virus, Lymphedema, Sickle Cell Disease Respiratory Denies history of Aspiration, Asthma, Chronic Obstructive Pulmonary Disease (COPD), Pneumothorax, Sleep Apnea, Tuberculosis Cardiovascular Denies history of Angina, Arrhythmia, Congestive Heart Failure, Coronary Artery Disease, Deep Vein Thrombosis, Hypertension, Hypotension, Myocardial Infarction, Peripheral Arterial Disease, Peripheral Venous Disease, Phlebitis, Vasculitis Gastrointestinal Denies history of Cirrhosis , Colitis, Crohn s, Hepatitis A, Hepatitis B, Hepatitis C Endocrine Denies history of Type I Diabetes, Type II Diabetes Genitourinary Denies history of End Stage Renal Disease Immunological Denies history of Lupus  Erythematosus, Raynaud s, Scleroderma Integumentary (Skin) Denies history of History of Burn, History of pressure wounds Musculoskeletal Denies history of Gout, Rheumatoid Arthritis, Osteoarthritis, Osteomyelitis Neurologic Denies history of Dementia, Neuropathy, Quadriplegia, Paraplegia, Seizure Disorder Oncologic Denies history of Received Chemotherapy, Received Radiation Psychiatric Denies history of Anorexia/bulimia, Confinement Anxiety Medical And Surgical History Notes Genitourinary MACHAEL, RAINE ([id number]) BPH Review of Systems (ROS) Constitutional Symptoms (General Health) Denies complaints or symptoms of Fatigue, Fever, Chills, Marked Weight Change. Respiratory Denies complaints or symptoms of Chronic or frequent coughs, Shortness of Breath. Cardiovascular Denies complaints or symptoms of Chest pain, LE edema. Psychiatric Denies complaints or symptoms of Anxiety, Claustrophobia. Objective Constitutional Well-nourished and well-hydrated in no acute distress. Vitals Time Taken: 8:41 AM, Height: 67 in, Weight: 148.1 lbs, BMI: 23.2, Temperature: 97.8 F, Pulse: 58 bpm, Respiratory Rate: 16 breaths/min, Blood Pressure: 134/60 mmHg. Respiratory normal breathing without difficulty. Psychiatric this patient is able to make decisions and demonstrates good insight into disease process. Alert and Oriented x 3. pleasant and cooperative. General Notes: Patient's wound bed currently showed signs of good epithelialization at this time. Overall I see no evidence of drainage, erythema, or any evidence of infection actively at this time. Hopefully this is a good sign and he is likely completely healed and will stay so going forward. Integumentary (Hair, Skin) Wound #1 status is Healed - Epithelialized. Original cause of wound was Surgical Injury. The wound is located on the Right,Lateral Calcaneus. The wound measures 0cm length x 0cm width x 0cm depth; 0cm^2 area and 0cm^3 volume.  There  is Fat Layer (Subcutaneous Tissue) Exposed exposed. There is no tunneling or undermining noted. There is a medium amount of serous drainage noted. The wound margin is flat and intact. There is large (67-100%) pink granulation within the wound bed. There is a small (1-33%) amount of necrotic tissue within the wound bed including Adherent Slough. 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 Cover for protection. My suggestion currently is gonna be that we discontinue wound care services I did suggest that when he rides his bike the were a Band-Aid of the area just to protect it but other than that I don't think there's any need for any additional dressings at this point. He's in agreement with this plan. If anything changes or worsens in the meantime he will contact the office and let me know. Otherwise we discontinue wound care services today and gave him advice to contact our office ASAP if he notices any drainage from the area. Electronic Signature(s) Signed: 08/02/2018 4:36:20 PM By: Worthy Keeler PA-C Entered By: Worthy Keeler on 08/02/2018 09:06:42 Jose [name], Jose [name] ([id number]) -------------------------------------------------------------------------------- ROS/PFSH Details Patient Name: Jose [name], Jose [name]. Date of Service: 08/02/2018 9:00 AM Medical Record Number: [id number] Patient Account Number: [id number] Date of Birth/Sex: 1945-03-20 (73 y.o. M) Treating RN: Cornell Barman Primary Care Provider: Golden Pop Other Clinician: Referring Provider: Golden Pop Treating Provider/Extender: Melburn Hake, Purvis Sidle Weeks in Treatment: 13 Information Obtained From Patient Constitutional Symptoms (General  Health) Complaints and Symptoms: Negative for: Fatigue; Fever; Chills; Marked Weight Change Respiratory Complaints and Symptoms: Negative for: Chronic or frequent coughs; Shortness of Breath Medical History: Negative for: Aspiration; Asthma; Chronic Obstructive Pulmonary Disease (COPD); Pneumothorax; Sleep Apnea; Tuberculosis Cardiovascular Complaints and Symptoms: Negative for: Chest pain; LE edema Medical History: Negative for: Angina; Arrhythmia; Congestive Heart Failure; Coronary Artery Disease; Deep Vein Thrombosis; Hypertension; Hypotension; Myocardial Infarction; Peripheral Arterial Disease; Peripheral Venous Disease; Phlebitis; Vasculitis Psychiatric Complaints and Symptoms: Negative for: Anxiety; Claustrophobia Medical History: Negative for: Anorexia/bulimia; Confinement Anxiety Eyes Medical History: Negative for: Cataracts; Glaucoma; Optic Neuritis Ear/Nose/Mouth/Throat Medical History: Negative for: Chronic sinus problems/congestion; Middle ear problems Hematologic/Lymphatic Medical History: Negative for: Anemia; Hemophilia; Human Immunodeficiency Virus; Lymphedema; Sickle Cell Disease Jose [name], Jose [name] ([id number]) Gastrointestinal Medical History: Negative for: Cirrhosis ; Colitis; Crohnos; Hepatitis A; Hepatitis B; Hepatitis C Endocrine Medical History: Negative for: Type I Diabetes; Type II Diabetes Genitourinary Medical History: Negative for: End Stage Renal Disease Past Medical History Notes: BPH Immunological Medical History: Negative for: Lupus Erythematosus; Raynaudos; Scleroderma Integumentary (Skin) Medical History: Negative for: History of Burn; History of pressure wounds Musculoskeletal Medical History: Negative for: Gout; Rheumatoid Arthritis; Osteoarthritis; Osteomyelitis Neurologic Medical History: Negative for: Dementia; Neuropathy; Quadriplegia; Paraplegia; Seizure Disorder Oncologic Medical History: Negative for: Received  Chemotherapy; Received Radiation Immunizations Pneumococcal Vaccine: Received Pneumococcal Vaccination: Yes Immunization Notes: up to date Implantable Devices None Family and Social History Cancer: Yes - Mother; Diabetes: No; Heart Disease: Yes - Mother; Hereditary Spherocytosis: No; Hypertension: Yes - Mother; Kidney Disease: No; Lung Disease: No; Seizures: No; Stroke: No; Thyroid Problems: No; Tuberculosis: No; Never smoker; Marital Status - Married; Alcohol Use: Never; Drug Use: No History; Caffeine Use: Daily; Financial Concerns: No; Food, Clothing or Shelter Needs: No; Support System Lacking: No; Transportation Concerns: No Physician Affirmation I have reviewed and agree with the above  information. JAYSUN, WESSELS ([id number]) Electronic Signature(s) Signed: 08/02/2018 4:36:20 PM By: Worthy Keeler PA-C Signed: 08/07/2018 5:54:26 PM By: Gretta Cool, BSN, RN, CWS, Kim RN, BSN Entered By: Worthy Keeler on 08/02/2018 09:06:15 Jose [name], KAUFFMANN ([id number]) -------------------------------------------------------------------------------- SuperBill Details Patient Name: FADY, STAMPS. Date of Service: 08/02/2018 Medical Record Number: [id number] Patient Account Number: [id number] Date of Birth/Sex: [birth year] (73 y.o. 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

## 2018-08-16 ENCOUNTER — Encounter: Payer: PPO | Admitting: Family Medicine

## 2018-08-28 ENCOUNTER — Ambulatory Visit (INDEPENDENT_AMBULATORY_CARE_PROVIDER_SITE_OTHER): Payer: PPO | Admitting: Nurse Practitioner

## 2018-08-28 ENCOUNTER — Other Ambulatory Visit: Payer: Self-pay

## 2018-08-28 ENCOUNTER — Encounter: Payer: Self-pay | Admitting: Nurse Practitioner

## 2018-08-28 VITALS — BP 123/74 | HR 55 | Temp 97.9°F | Ht 67.0 in | Wt 136.0 lb

## 2018-08-28 DIAGNOSIS — Z0000 Encounter for general adult medical examination without abnormal findings: Secondary | ICD-10-CM | POA: Diagnosis not present

## 2018-08-28 DIAGNOSIS — N401 Enlarged prostate with lower urinary tract symptoms: Secondary | ICD-10-CM

## 2018-08-28 NOTE — Progress Notes (Signed)
BP 123/74   Pulse (!) 55   Temp 97.9 F (36.6 C) (Oral)   Ht 5\' 7"  (1.702 m)   Wt 136 lb (61.7 kg)   SpO2 100%   BMI 21.30 kg/m    Subjective:    Patient ID: Jose [name], male    DOB: [birth year], 73 y.o.   MRN: [id number]  HPI: Jose [name] is a 73 y.o. male presenting on 08/28/2018 for comprehensive medical examination. Current medical complaints include: negative  He currently lives with: wife Interim Problems from his last visit: no   BPH Continues on Finasteride 5 MG at bedtime. BPH status: controlled Satisfied with current treatment?: yes Medication side effects: no Medication compliance: good compliance Duration: chronic Nocturia: 1/night Urinary frequency:no Incomplete voiding: no Urgency: no Weak urinary stream: no Straining to start stream: no Dysuria: no Onset: gradual Severity: mild Alleviating factors:  Aggravating factors:  Treatments attempted:  AUA symptom score:   Functional Status Survey: Is the patient deaf or have difficulty hearing?: No Does the patient have difficulty seeing, even when wearing glasses/contacts?: No Does the patient have difficulty concentrating, remembering, or making decisions?: No Does the patient have difficulty walking or climbing stairs?: No Does the patient have difficulty dressing or bathing?: No Does the patient have difficulty doing errands alone such as visiting a doctor's office or shopping?: No  FALL RISK: Fall Risk  08/28/2018 07/16/2018 07/05/2017 07/04/2016  Falls in the past year? 1 0 Yes No  Number falls in past yr: 0 - 1 -  Injury with Fall? 1 - Yes -  Follow up Falls evaluation completed - Falls evaluation completed -    Depression Screen Depression screen [facility] 2/9 08/28/2018 07/16/2018 07/05/2017 07/04/2016  Decreased Interest 0 0 0 0  Down, Depressed, Hopeless 0 0 0 0  PHQ - 2 Score 0 0 0 0  Altered sleeping 0 - - -  Tired, decreased energy 0 - - -  Change in appetite 0 - - -  Feeling bad or  failure about yourself  0 - - -  Trouble concentrating 0 - - -  Moving slowly or fidgety/restless 0 - - -  Suicidal thoughts 0 - - -  PHQ-9 Score 0 - - -  Difficult doing work/chores Not difficult at all - - -    Past Medical History:  Past Medical History:  Diagnosis Date  . Asthma    as a child  . Basal cell carcinoma (BCC) of scalp 08/2015  . Collapsed lung 02/2017  . Family history of adverse reaction to anesthesia    brother-PT UNSURE BUT WILL FIND OUT REACTION  . GERD (gastroesophageal reflux disease)    occ  . Osteoporosis     Surgical History:  Past Surgical History:  Procedure Laterality Date  . broken collar bone  2002/2007  . FRACTURE SURGERY  2007   broken left thigh bone  . FRACTURE SURGERY  2007   broken left upper arm  . HERNIA REPAIR    . LEG SURGERY    . ORIF CALCANEOUS FRACTURE Right 12/29/2017   Procedure: OPEN REDUCTION INTERNAL FIXATION (ORIF) CALCANEOUS FRACTURE;  Surgeon: Samara Deist, DPM;  Location: [facility] ORS;  Service: Podiatry;  Laterality: Right;    Medications:  Current Outpatient Medications on File Prior to Visit  Medication Sig  . calcium-vitamin D (OSCAL WITH D) 500-200 MG-UNIT tablet Take 1 tablet by mouth.  . docusate sodium (COLACE) 100 MG capsule Take 200 mg by mouth at bedtime.  Marland Kitchen  finasteride (PROSCAR) 5 MG tablet Take 1 tablet (5 mg total) by mouth daily. (Patient taking differently: Take 5 mg by mouth at bedtime. )  . Multiple Vitamin (MULTIVITAMIN) tablet Take 1 tablet by mouth daily.   No current facility-administered medications on file prior to visit.     Allergies:  No Known Allergies  Social History:  Social History   Socioeconomic History  . Marital status: Widowed    Spouse name: Not on file  . Number of children: Not on file  . Years of education: 2 associate degreee   . Highest education level: Associate degree: academic program  Occupational History  . Occupation: retired   Scientific laboratory technician  . Financial  resource strain: Not hard at all  . Food insecurity    Worry: Never true    Inability: Never true  . Transportation needs    Medical: No    Non-medical: No  Tobacco Use  . Smoking status: Former Smoker    Years: 4.00    Types: Cigarettes    Quit date: 02/10/1969    Years since quitting: 49.5  . Smokeless tobacco: Never Used  Substance and Sexual Activity  . Alcohol use: No  . Drug use: No  . Sexual activity: Not on file  Lifestyle  . Physical activity    Days per week: 4 days    Minutes per session: 60 min  . Stress: Not at all  Relationships  . Social connections    Talks on phone: More than three times a week    Gets together: More than three times a week    Attends religious service: Never    Active member of club or organization: Yes    Attends meetings of clubs or organizations: More than 4 times per year    Relationship status: Widowed  . Intimate partner violence    Fear of current or ex partner: No    Emotionally abused: No    Physically abused: No    Forced sexual activity: No  Other Topics Concern  . Not on file  Social History Narrative  . Not on file   Social History   Tobacco Use  Smoking Status Former Smoker  . Years: 4.00  . Types: Cigarettes  . Quit date: 02/10/1969  . Years since quitting: 49.5  Smokeless Tobacco Never Used   Social History   Substance and Sexual Activity  Alcohol Use No    Family History:  Family History  Problem Relation Age of Onset  . Congestive Heart Failure Mother   . Cancer Paternal Grandfather        leukemia    Past medical history, surgical history, medications, allergies, family history and social history reviewed with patient today and changes made to appropriate areas of the chart.   Review of Systems -  negative All other ROS negative except what is listed above and in the HPI.      Objective:    BP 123/74   Pulse (!) 55   Temp 97.9 F (36.6 C) (Oral)   Ht 5\' 7"  (1.702 m)   Wt 136 lb (61.7 kg)    SpO2 100%   BMI 21.30 kg/m   Wt Readings from Last 3 Encounters:  08/28/18 136 lb (61.7 kg)  07/16/18 137 lb (62.1 kg)  05/03/18 143 lb (64.9 kg)    Physical Exam Vitals signs and nursing note reviewed. Exam conducted with a chaperone present.  Constitutional:      General: He is awake.  He is not in acute distress.    Appearance: He is well-developed. He is not ill-appearing.  HENT:     Head: Normocephalic and atraumatic.     Right Ear: Hearing normal. No drainage.     Left Ear: Hearing normal. No drainage.     Mouth/Throat:     Pharynx: Uvula midline.  Eyes:     General: Lids are normal.        Right eye: No discharge.        Left eye: No discharge.     Extraocular Movements: Extraocular movements intact.     Conjunctiva/sclera: Conjunctivae normal.     Pupils: Pupils are equal, round, and reactive to light.     Visual Fields: Right eye visual fields normal and left eye visual fields normal.  Neck:     Musculoskeletal: Normal range of motion and neck supple.     Thyroid: No thyromegaly.     Vascular: No carotid bruit or JVD.     Trachea: Trachea normal.  Cardiovascular:     Rate and Rhythm: Normal rate and regular rhythm.     Heart sounds: Normal heart sounds, S1 normal and S2 normal. No murmur. No gallop.   Pulmonary:     Effort: Pulmonary effort is normal. No accessory muscle usage or respiratory distress.     Breath sounds: Normal breath sounds.  Abdominal:     General: Bowel sounds are normal.     Palpations: Abdomen is soft. There is no hepatomegaly or splenomegaly.     Hernia: There is no hernia in the left inguinal area or right inguinal area.  Genitourinary:    Penis: Normal.      Scrotum/Testes: Normal.     Epididymis:     Right: Normal.     Left: Normal.     Prostate: Normal.     Rectum: Normal.  Musculoskeletal: Normal range of motion.     Right lower leg: No edema.     Left lower leg: No edema.  Skin:    General: Skin is warm and dry.      Capillary Refill: Capillary refill takes less than 2 seconds.     Findings: No rash.  Neurological:     Mental Status: He is alert and oriented to person, place, and time.     Cranial Nerves: Cranial nerves are intact.     Coordination: Coordination is intact.     Gait: Gait is intact.     Deep Tendon Reflexes: Reflexes are normal and symmetric.  Psychiatric:        Attention and Perception: Attention normal.        Mood and Affect: Mood normal.        Speech: Speech normal.        Behavior: Behavior normal. Behavior is cooperative.        Thought Content: Thought content normal.        Cognition and Memory: Cognition normal.        Judgment: Judgment normal.      Results for orders placed or performed during the hospital encounter of 05/03/18  Comprehensive metabolic panel  Result Value Ref Range   Sodium 138 135 - 145 mmol/L   Potassium 4.2 3.5 - 5.1 mmol/L   Chloride 104 98 - 111 mmol/L   CO2 28 22 - 32 mmol/L   Glucose, Bld 98 70 - 99 mg/dL   BUN 16 8 - 23 mg/dL   Creatinine, Ser 0.69 0.61 - 1.24 mg/dL  Calcium 9.0 8.9 - 10.3 mg/dL   Total Protein 7.3 6.5 - 8.1 g/dL   Albumin 4.1 3.5 - 5.0 g/dL   AST 29 15 - 41 U/L   ALT 22 0 - 44 U/L   Alkaline Phosphatase 56 38 - 126 U/L   Total Bilirubin 0.3 0.3 - 1.2 mg/dL   GFR calc non Af Amer >60 >60 mL/min   GFR calc Af Amer >60 >60 mL/min   Anion gap 6 5 - 15  CBC w/Diff  Result Value Ref Range   WBC 5.9 4.0 - 10.5 K/uL   RBC 4.07 (L) 4.22 - 5.81 MIL/uL   Hemoglobin 12.5 (L) 13.0 - 17.0 g/dL   HCT 37.2 (L) 39.0 - 52.0 %   MCV 91.4 80.0 - 100.0 fL   MCH 30.7 26.0 - 34.0 pg   MCHC 33.6 30.0 - 36.0 g/dL   RDW 12.8 11.5 - 15.5 %   Platelets 225 150 - 400 K/uL   nRBC 0.0 0.0 - 0.2 %   Neutrophils Relative % 53 %   Neutro Abs 3.1 1.7 - 7.7 K/uL   Lymphocytes Relative 35 %   Lymphs Abs 2.0 0.7 - 4.0 K/uL   Monocytes Relative 8 %   Monocytes Absolute 0.5 0.1 - 1.0 K/uL   Eosinophils Relative 3 %   Eosinophils Absolute  0.2 0.0 - 0.5 K/uL   Basophils Relative 1 %   Basophils Absolute 0.1 0.0 - 0.1 K/uL   Immature Granulocytes 0 %   Abs Immature Granulocytes 0.01 0.00 - 0.07 K/uL  C-reactive protein  Result Value Ref Range   CRP 0.8 <1.0 mg/dL  Sedimentation rate  Result Value Ref Range   Sed Rate 14 0 - 20 mm/hr      Assessment & Plan:   Problem List Items Addressed This Visit      Genitourinary   BPH (benign prostatic hyperplasia)    Chronic, stable.  Continue current medication regimen.        Relevant Orders   PSA    Other Visit Diagnoses    Encounter for annual physical exam    -  Primary   Annual labs today   Relevant Orders   Comprehensive metabolic panel   CBC with Differential/Platelet   Lipid Panel w/o Chol/HDL Ratio   PSA   TSH      Discussed aspirin prophylaxis for myocardial infarction prevention and decision was it was not indicated  LABORATORY TESTING:  Health maintenance labs ordered today as discussed above.   The natural history of prostate cancer and ongoing controversy regarding screening and potential treatment outcomes of prostate cancer has been discussed with the patient. The meaning of a false positive PSA and a false negative PSA has been discussed. He indicates understanding of the limitations of this screening test and wishes to proceed with screening PSA testing.   IMMUNIZATIONS:   - Tdap: Tetanus vaccination status reviewed: last tetanus booster within 10 years. - Influenza: Up to date - Pneumovax: Up to date - Prevnar: Up to date - Zostavax vaccine: Up to date  SCREENING: - Colonoscopy: Up to date  Discussed with patient purpose of the colonoscopy is to detect colon cancer at curable precancerous or early stages   - AAA Screening: Not applicable  -Hearing Test: Not applicable  -Spirometry: Not applicable   PATIENT COUNSELING:    Sexuality: Discussed sexually transmitted diseases, partner selection, use of condoms, avoidance of unintended  pregnancy  and contraceptive alternatives.   Advised  to avoid cigarette smoking.  I discussed with the patient that most people either abstain from alcohol or drink within safe limits (<=14/week and <=4 drinks/occasion for males, <=7/weeks and <= 3 drinks/occasion for females) and that the risk for alcohol disorders and other health effects rises proportionally with the number of drinks per week and how often a drinker exceeds daily limits.  Discussed cessation/primary prevention of drug use and availability of treatment for abuse.   Diet: Encouraged to adjust caloric intake to maintain  or achieve ideal body weight, to reduce intake of dietary saturated fat and total fat, to limit sodium intake by avoiding high sodium foods and not adding table salt, and to maintain adequate dietary potassium and calcium preferably from fresh fruits, vegetables, and low-fat dairy products.    stressed the importance of regular exercise  Injury prevention: Discussed safety belts, safety helmets, smoke detector, smoking near bedding or upholstery.   Dental health: Discussed importance of regular tooth brushing, flossing, and dental visits.   Follow up plan: NEXT PREVENTATIVE PHYSICAL DUE IN 1 YEAR. No follow-ups on file.

## 2018-08-28 NOTE — Assessment & Plan Note (Signed)
Chronic, stable.  Continue current medication regimen. 

## 2018-08-28 NOTE — Patient Instructions (Signed)

## 2018-08-29 ENCOUNTER — Other Ambulatory Visit: Payer: Self-pay | Admitting: Family Medicine

## 2018-08-29 DIAGNOSIS — N401 Enlarged prostate with lower urinary tract symptoms: Secondary | ICD-10-CM

## 2018-08-29 LAB — COMPREHENSIVE METABOLIC PANEL
ALT: 17 IU/L (ref 0–44)
AST: 25 IU/L (ref 0–40)
Albumin/Globulin Ratio: 1.9 (ref 1.2–2.2)
Albumin: 4.2 g/dL (ref 3.7–4.7)
Alkaline Phosphatase: 57 IU/L (ref 39–117)
BUN/Creatinine Ratio: 21 (ref 10–24)
BUN: 15 mg/dL (ref 8–27)
Bilirubin Total: 0.4 mg/dL (ref 0.0–1.2)
CO2: 27 mmol/L (ref 20–29)
Calcium: 9.3 mg/dL (ref 8.6–10.2)
Chloride: 104 mmol/L (ref 96–106)
Creatinine, Ser: 0.72 mg/dL — ABNORMAL LOW (ref 0.76–1.27)
GFR calc Af Amer: 107 mL/min/{1.73_m2} (ref >59)
GFR calc non Af Amer: 93 mL/min/{1.73_m2} (ref >59)
Globulin, Total: 2.2 g/dL (ref 1.5–4.5)
Glucose: 85 mg/dL (ref 65–99)
Potassium: 4.8 mmol/L (ref 3.5–5.2)
Sodium: 142 mmol/L (ref 134–144)
Total Protein: 6.4 g/dL (ref 6.0–8.5)

## 2018-08-29 LAB — CBC WITH DIFFERENTIAL/PLATELET
Basophils Absolute: 0 10*3/uL (ref 0.0–0.2)
Basos: 1 %
EOS (ABSOLUTE): 0.1 10*3/uL (ref 0.0–0.4)
Eos: 3 %
Hematocrit: 35 % — ABNORMAL LOW (ref 37.5–51.0)
Hemoglobin: 11.7 g/dL — ABNORMAL LOW (ref 13.0–17.7)
Immature Grans (Abs): 0 10*3/uL (ref 0.0–0.1)
Immature Granulocytes: 0 %
Lymphocytes Absolute: 1.6 10*3/uL (ref 0.7–3.1)
Lymphs: 39 %
MCH: 31.1 pg (ref 26.6–33.0)
MCHC: 33.4 g/dL (ref 31.5–35.7)
MCV: 93 fL (ref 79–97)
Monocytes Absolute: 0.4 10*3/uL (ref 0.1–0.9)
Monocytes: 9 %
Neutrophils Absolute: 1.9 10*3/uL (ref 1.4–7.0)
Neutrophils: 48 %
Platelets: 211 10*3/uL (ref 150–450)
RBC: 3.76 x10E6/uL — ABNORMAL LOW (ref 4.14–5.80)
RDW: 11.8 % (ref 11.6–15.4)
WBC: 4.1 10*3/uL (ref 3.4–10.8)

## 2018-08-29 LAB — LIPID PANEL W/O CHOL/HDL RATIO
Cholesterol, Total: 130 mg/dL (ref 100–199)
HDL: 58 mg/dL (ref >39)
LDL Calculated: 59 mg/dL (ref 0–99)
Triglycerides: 65 mg/dL (ref 0–149)
VLDL Cholesterol Cal: 13 mg/dL (ref 5–40)

## 2018-08-29 LAB — TSH: TSH: 1.81 u[IU]/mL (ref 0.450–4.500)

## 2018-08-29 LAB — PSA: Prostate Specific Ag, Serum: 1.5 ng/mL (ref 0.0–4.0)

## 2018-10-23 DIAGNOSIS — L97512 Non-pressure chronic ulcer of other part of right foot with fat layer exposed: Secondary | ICD-10-CM | POA: Diagnosis not present

## 2018-10-23 DIAGNOSIS — S92011S Displaced fracture of body of right calcaneus, sequela: Secondary | ICD-10-CM | POA: Diagnosis not present

## 2018-11-02 DIAGNOSIS — M79671 Pain in right foot: Secondary | ICD-10-CM | POA: Diagnosis not present

## 2018-11-02 DIAGNOSIS — M25674 Stiffness of right foot, not elsewhere classified: Secondary | ICD-10-CM | POA: Diagnosis not present

## 2018-11-06 DIAGNOSIS — M79671 Pain in right foot: Secondary | ICD-10-CM | POA: Diagnosis not present

## 2018-11-06 DIAGNOSIS — M25674 Stiffness of right foot, not elsewhere classified: Secondary | ICD-10-CM | POA: Diagnosis not present

## 2018-11-14 DIAGNOSIS — M79671 Pain in right foot: Secondary | ICD-10-CM | POA: Diagnosis not present

## 2018-11-14 DIAGNOSIS — M25674 Stiffness of right foot, not elsewhere classified: Secondary | ICD-10-CM | POA: Diagnosis not present

## 2018-11-20 DIAGNOSIS — M25674 Stiffness of right foot, not elsewhere classified: Secondary | ICD-10-CM | POA: Diagnosis not present

## 2018-11-20 DIAGNOSIS — M79671 Pain in right foot: Secondary | ICD-10-CM | POA: Diagnosis not present

## 2018-11-27 DIAGNOSIS — M79671 Pain in right foot: Secondary | ICD-10-CM | POA: Diagnosis not present

## 2018-11-27 DIAGNOSIS — M25674 Stiffness of right foot, not elsewhere classified: Secondary | ICD-10-CM | POA: Diagnosis not present

## 2018-12-04 DIAGNOSIS — M25674 Stiffness of right foot, not elsewhere classified: Secondary | ICD-10-CM | POA: Diagnosis not present

## 2018-12-04 DIAGNOSIS — M79671 Pain in right foot: Secondary | ICD-10-CM | POA: Diagnosis not present

## 2018-12-06 ENCOUNTER — Other Ambulatory Visit: Payer: Self-pay | Admitting: Family Medicine

## 2018-12-06 DIAGNOSIS — N401 Enlarged prostate with lower urinary tract symptoms: Secondary | ICD-10-CM

## 2018-12-11 DIAGNOSIS — M25674 Stiffness of right foot, not elsewhere classified: Secondary | ICD-10-CM | POA: Diagnosis not present

## 2018-12-11 DIAGNOSIS — M79671 Pain in right foot: Secondary | ICD-10-CM | POA: Diagnosis not present

## 2018-12-19 DIAGNOSIS — M79671 Pain in right foot: Secondary | ICD-10-CM | POA: Diagnosis not present

## 2018-12-19 DIAGNOSIS — M25674 Stiffness of right foot, not elsewhere classified: Secondary | ICD-10-CM | POA: Diagnosis not present

## 2018-12-25 DIAGNOSIS — M79671 Pain in right foot: Secondary | ICD-10-CM | POA: Diagnosis not present

## 2018-12-25 DIAGNOSIS — M25674 Stiffness of right foot, not elsewhere classified: Secondary | ICD-10-CM | POA: Diagnosis not present

## 2019-01-01 DIAGNOSIS — M25674 Stiffness of right foot, not elsewhere classified: Secondary | ICD-10-CM | POA: Diagnosis not present

## 2019-01-01 DIAGNOSIS — M79671 Pain in right foot: Secondary | ICD-10-CM | POA: Diagnosis not present

## 2019-01-11 DIAGNOSIS — M79671 Pain in right foot: Secondary | ICD-10-CM | POA: Diagnosis not present

## 2019-01-11 DIAGNOSIS — M25674 Stiffness of right foot, not elsewhere classified: Secondary | ICD-10-CM | POA: Diagnosis not present

## 2019-01-16 DIAGNOSIS — M79671 Pain in right foot: Secondary | ICD-10-CM | POA: Diagnosis not present

## 2019-01-16 DIAGNOSIS — M25674 Stiffness of right foot, not elsewhere classified: Secondary | ICD-10-CM | POA: Diagnosis not present

## 2019-01-22 DIAGNOSIS — M79671 Pain in right foot: Secondary | ICD-10-CM | POA: Diagnosis not present

## 2019-01-22 DIAGNOSIS — M25674 Stiffness of right foot, not elsewhere classified: Secondary | ICD-10-CM | POA: Diagnosis not present

## 2019-01-29 DIAGNOSIS — M79671 Pain in right foot: Secondary | ICD-10-CM | POA: Diagnosis not present

## 2019-01-29 DIAGNOSIS — S92011D Displaced fracture of body of right calcaneus, subsequent encounter for fracture with routine healing: Secondary | ICD-10-CM | POA: Diagnosis not present

## 2019-01-29 DIAGNOSIS — M25674 Stiffness of right foot, not elsewhere classified: Secondary | ICD-10-CM | POA: Diagnosis not present

## 2019-02-07 DIAGNOSIS — M25674 Stiffness of right foot, not elsewhere classified: Secondary | ICD-10-CM | POA: Diagnosis not present

## 2019-02-07 DIAGNOSIS — M79671 Pain in right foot: Secondary | ICD-10-CM | POA: Diagnosis not present

## 2019-02-12 DIAGNOSIS — M25674 Stiffness of right foot, not elsewhere classified: Secondary | ICD-10-CM | POA: Diagnosis not present

## 2019-02-12 DIAGNOSIS — M79671 Pain in right foot: Secondary | ICD-10-CM | POA: Diagnosis not present

## 2019-02-19 DIAGNOSIS — C44519 Basal cell carcinoma of skin of other part of trunk: Secondary | ICD-10-CM | POA: Diagnosis not present

## 2019-02-19 DIAGNOSIS — D485 Neoplasm of uncertain behavior of skin: Secondary | ICD-10-CM | POA: Diagnosis not present

## 2019-02-19 DIAGNOSIS — Z85828 Personal history of other malignant neoplasm of skin: Secondary | ICD-10-CM | POA: Diagnosis not present

## 2019-02-19 DIAGNOSIS — D2261 Melanocytic nevi of right upper limb, including shoulder: Secondary | ICD-10-CM | POA: Diagnosis not present

## 2019-02-19 DIAGNOSIS — M79671 Pain in right foot: Secondary | ICD-10-CM | POA: Diagnosis not present

## 2019-02-19 DIAGNOSIS — X32XXXA Exposure to sunlight, initial encounter: Secondary | ICD-10-CM | POA: Diagnosis not present

## 2019-02-19 DIAGNOSIS — C44612 Basal cell carcinoma of skin of right upper limb, including shoulder: Secondary | ICD-10-CM | POA: Diagnosis not present

## 2019-02-19 DIAGNOSIS — D2262 Melanocytic nevi of left upper limb, including shoulder: Secondary | ICD-10-CM | POA: Diagnosis not present

## 2019-02-19 DIAGNOSIS — L570 Actinic keratosis: Secondary | ICD-10-CM | POA: Diagnosis not present

## 2019-02-19 DIAGNOSIS — C44619 Basal cell carcinoma of skin of left upper limb, including shoulder: Secondary | ICD-10-CM | POA: Diagnosis not present

## 2019-02-19 DIAGNOSIS — M25674 Stiffness of right foot, not elsewhere classified: Secondary | ICD-10-CM | POA: Diagnosis not present

## 2019-02-19 DIAGNOSIS — D2271 Melanocytic nevi of right lower limb, including hip: Secondary | ICD-10-CM | POA: Diagnosis not present

## 2019-02-19 DIAGNOSIS — L821 Other seborrheic keratosis: Secondary | ICD-10-CM | POA: Diagnosis not present

## 2019-02-26 DIAGNOSIS — M25674 Stiffness of right foot, not elsewhere classified: Secondary | ICD-10-CM | POA: Diagnosis not present

## 2019-02-26 DIAGNOSIS — M79671 Pain in right foot: Secondary | ICD-10-CM | POA: Diagnosis not present

## 2019-03-05 DIAGNOSIS — M25674 Stiffness of right foot, not elsewhere classified: Secondary | ICD-10-CM | POA: Diagnosis not present

## 2019-03-05 DIAGNOSIS — M79671 Pain in right foot: Secondary | ICD-10-CM | POA: Diagnosis not present

## 2019-03-06 ENCOUNTER — Other Ambulatory Visit: Payer: Self-pay

## 2019-03-06 DIAGNOSIS — N401 Enlarged prostate with lower urinary tract symptoms: Secondary | ICD-10-CM

## 2019-03-06 MED ORDER — FINASTERIDE 5 MG PO TABS: 5.0000 mg | ORAL_TABLET | Freq: Every day | ORAL | 3 refills | Status: DC

## 2019-03-06 NOTE — Telephone Encounter (Signed)
Patient last seen 08/28/18 and has appointment next June.

## 2019-04-15 DIAGNOSIS — C44619 Basal cell carcinoma of skin of left upper limb, including shoulder: Secondary | ICD-10-CM | POA: Diagnosis not present

## 2019-04-15 DIAGNOSIS — C44519 Basal cell carcinoma of skin of other part of trunk: Secondary | ICD-10-CM | POA: Diagnosis not present

## 2019-04-15 DIAGNOSIS — C44612 Basal cell carcinoma of skin of right upper limb, including shoulder: Secondary | ICD-10-CM | POA: Diagnosis not present

## 2019-05-22 IMAGING — CT CT ANKLE*R* W/CM
3 series · 10 of 33 positions shown, 12 images · IV contrast (omnipaque)
Comparison: CT scan 12/20/2017

CLINICAL DATA: Nonhealing calcaneal fracture.

EXAM:
CT OF THE RIGHT ANKLE WITH CONTRAST
TECHNIQUE: Multidetector CT imaging of the right ankle was performed following
the standard protocol during bolus administration of intravenous
contrast.
CONTRAST:  100mL OMNIPAQUE IOHEXOL 300 MG/ML  SOLN

[Series 7: cor st · coronal · 0.22mm/px · 3 of 151 slices shown]
[im 31/151  bone]
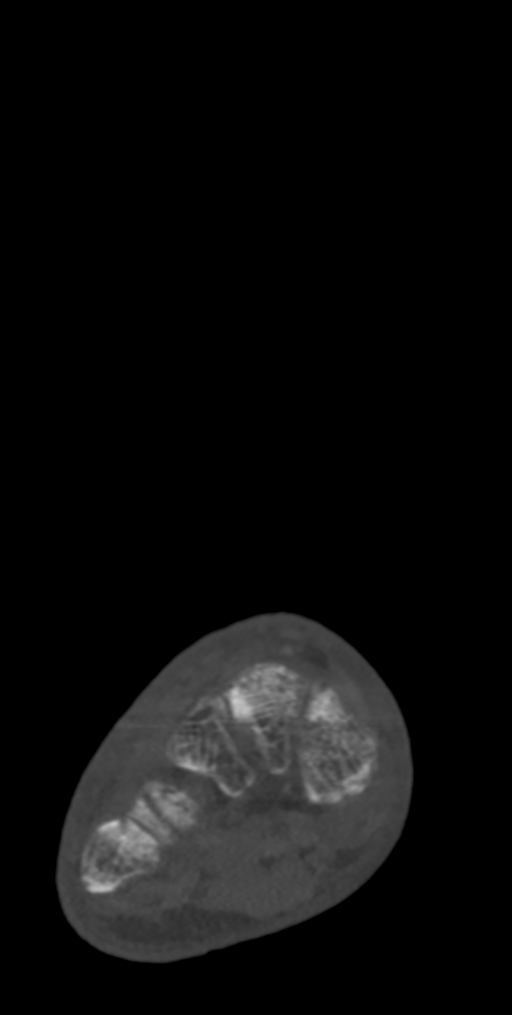
[im 61/151  bone]
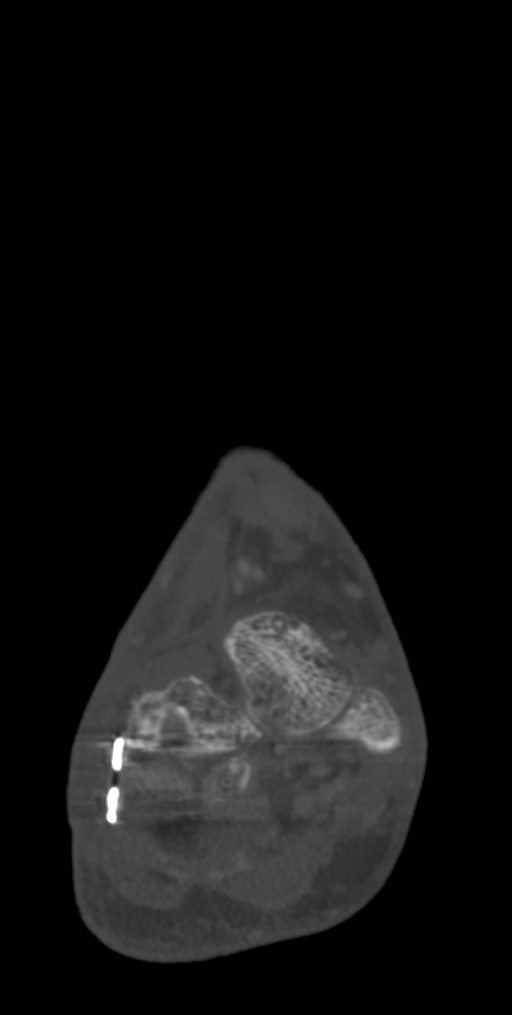
[im 91/151  bone]
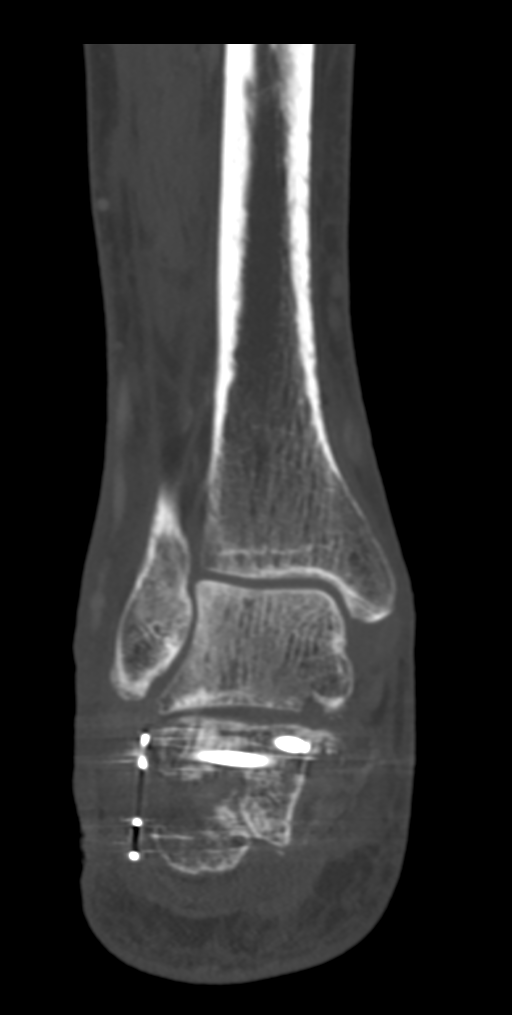

[Series 9: sag st · sagittal · 0.29mm/px · 5 of 91 slices shown, 6 images]
[im 31/91  bone]
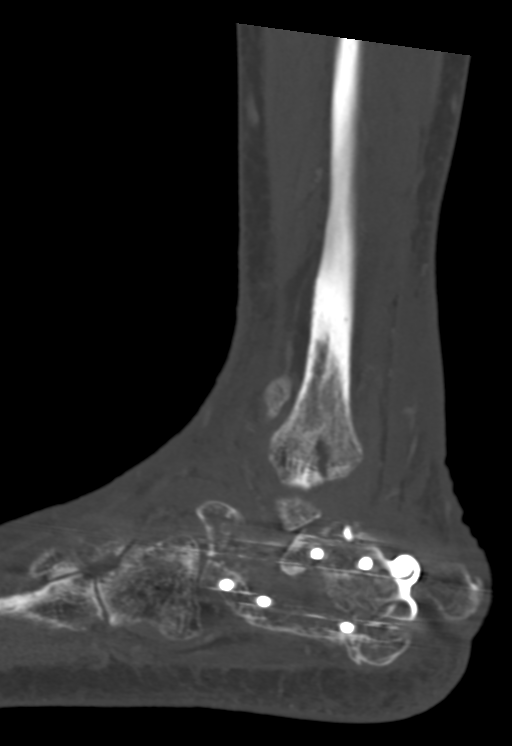
[im 38/91  bone]
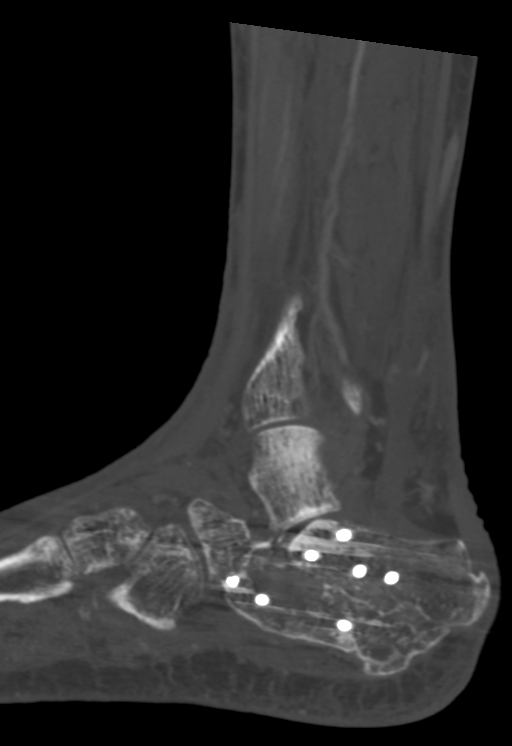
[im 46/91  soft-tissue]
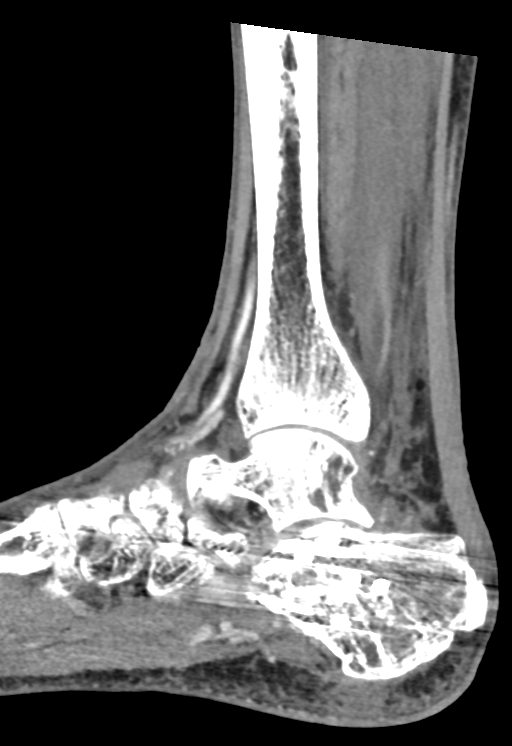
[im 46/91  bone]
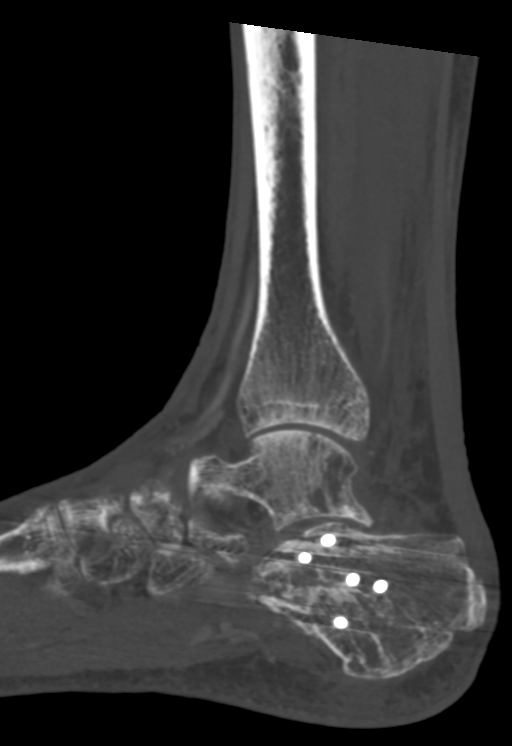
[im 53/91  bone]
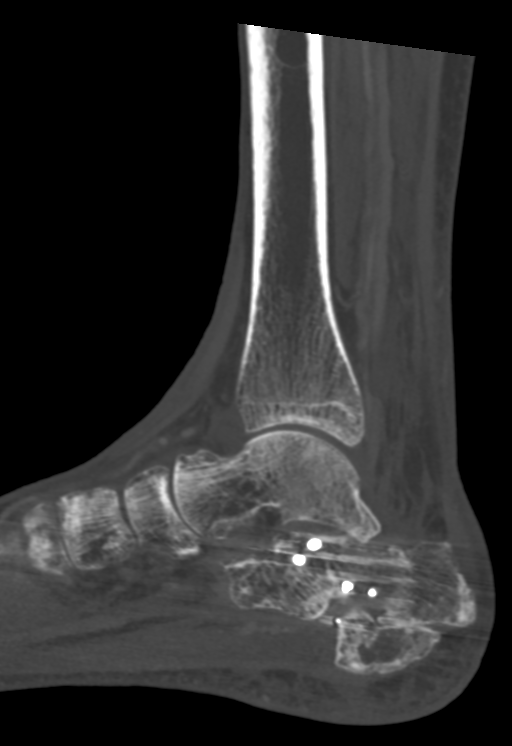
[im 61/91  bone]
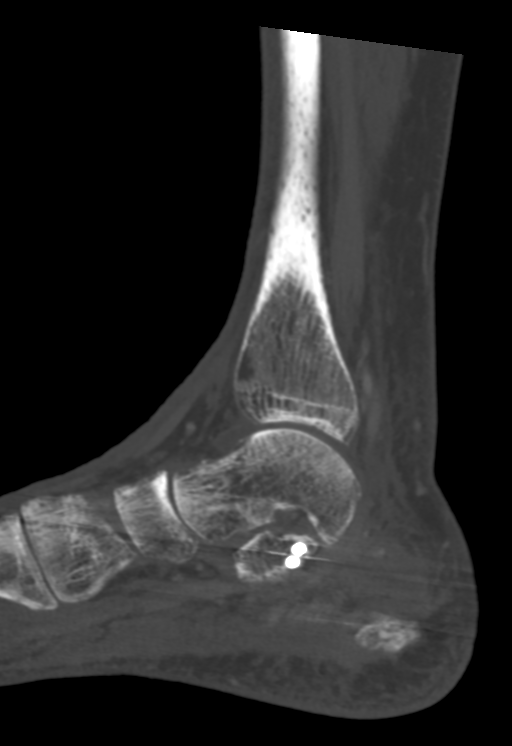

[Series 10: axial st · axial · 0.22mm/px · z∈[+51,+151]mm · 2 of 220 slices shown, 3 images]
[im 68/220  soft-tissue]
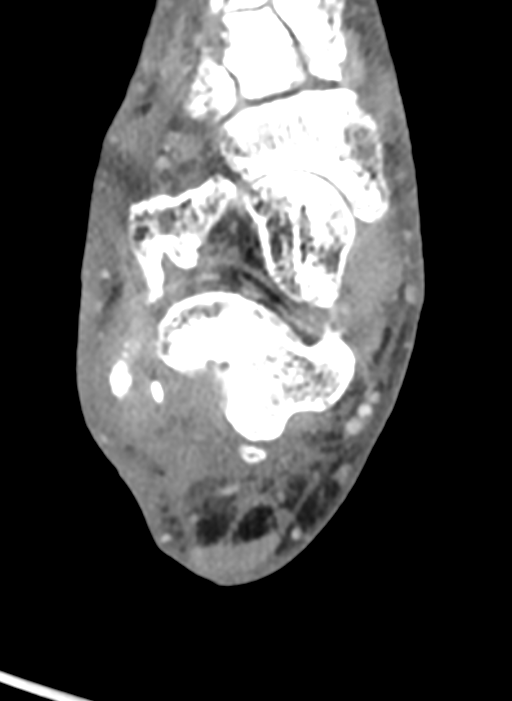
[im 68/220  bone]
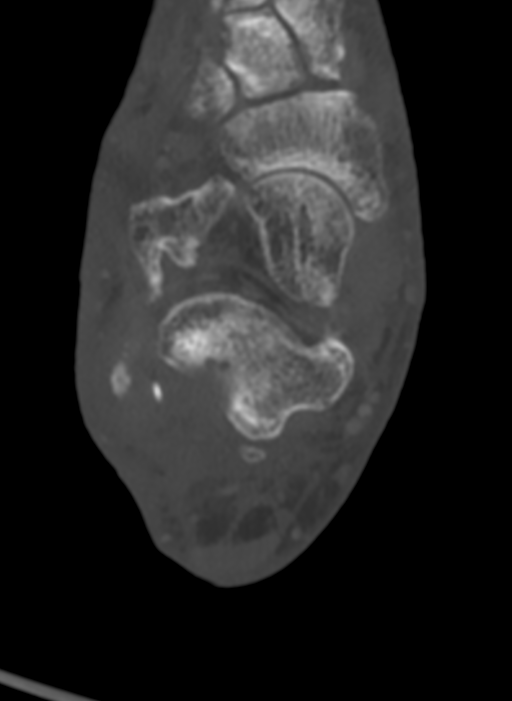
[im 169/220  bone]
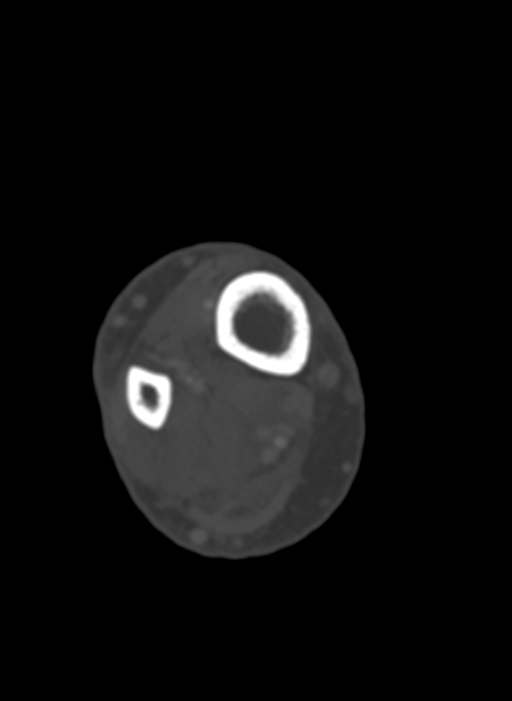

[10 of 33 positions shown; findings below may reference images not displayed]

FINDINGS: There is an open wound along the lateral aspect of the calcaneus
posteriorly which goes down close to the hardware. Diffuse
subcutaneous soft tissue swelling/edema/fluid suggesting cellulitis.
I do not see an obvious drainable soft tissue abscess.

There is a large defect in the central aspect of the calcaneus with
there is no osteoid material or bone graft identified. This measures
approximately 2.5 x 2.1 cm.

There has been some healing along the medial aspect of the calcaneal
fractures but the central and lateral portions are ununited.

The lateral aspect of the posterior facet is still depressed and
slightly rotated. The sustentacular limb is intact in the middle
facet is maintained.

The tibiotalar joint appears normal. The midfoot bony structures are
intact. The talonavicular joint is maintained.

Fairly extensive/aggressive disuse osteo pro CIS.
IMPRESSION: 1. Open wound along the lateral aspect of the calcaneus posteriorly
which goes down close to the hardware.
2. Diffuse cellulitis without definite discrete drainable soft
tissue abscess.
3. Large defect in the central calcaneus devoid of any osseous
material or bone graft. This could be due to resorptive changes or
osteomyelitis.
4. Disuse osteoporosis.

## 2019-06-14 DIAGNOSIS — S42002K Fracture of unspecified part of left clavicle, subsequent encounter for fracture with nonunion: Secondary | ICD-10-CM | POA: Diagnosis not present

## 2019-06-14 DIAGNOSIS — M7522 Bicipital tendinitis, left shoulder: Secondary | ICD-10-CM | POA: Diagnosis not present

## 2019-08-30 ENCOUNTER — Ambulatory Visit (INDEPENDENT_AMBULATORY_CARE_PROVIDER_SITE_OTHER): Payer: PPO | Admitting: Nurse Practitioner

## 2019-08-30 ENCOUNTER — Encounter: Payer: Self-pay | Admitting: Nurse Practitioner

## 2019-08-30 ENCOUNTER — Other Ambulatory Visit: Payer: Self-pay

## 2019-08-30 VITALS — BP 109/73 | HR 71 | Temp 98.3°F | Ht 66.0 in | Wt 137.6 lb

## 2019-08-30 DIAGNOSIS — N400 Enlarged prostate without lower urinary tract symptoms: Secondary | ICD-10-CM | POA: Diagnosis not present

## 2019-08-30 DIAGNOSIS — Z0000 Encounter for general adult medical examination without abnormal findings: Secondary | ICD-10-CM

## 2019-08-30 DIAGNOSIS — Z1329 Encounter for screening for other suspected endocrine disorder: Secondary | ICD-10-CM | POA: Diagnosis not present

## 2019-08-30 DIAGNOSIS — Z13220 Encounter for screening for lipoid disorders: Secondary | ICD-10-CM

## 2019-08-30 MED ORDER — SHINGRIX 50 MCG/0.5ML IM SUSR: 0.5000 mL | Freq: Once | INTRAMUSCULAR | 0 refills | Status: AC

## 2019-08-30 NOTE — Progress Notes (Signed)
BP 109/73   Pulse 71   Temp 98.3 F (36.8 C) (Oral)   Ht 5\' 6"  (1.676 m)   Wt 137 lb 9.6 oz (62.4 kg)   SpO2 96%   BMI 22.21 kg/m    Subjective:    Patient ID: Jose [name], male    DOB: 09-13-1945, 74 y.o.   MRN: [id number]  HPI: Jose [name] is a 74 y.o. male presenting on 08/30/2019 for comprehensive medical examination. Current medical complaints include:none  He currently lives with: wife Interim Problems from his last visit: no   BPH Continues on Finasteride 5 MG at bedtime. BPH status: controlled Satisfied with current treatment?: yes Medication side effects: no Medication compliance: good compliance Duration: chronic Nocturia: 1/night Urinary frequency:no Incomplete voiding: no Urgency: no Weak urinary stream: no Straining to start stream: no Dysuria: no Onset: gradual Severity: mild  The 10-year ASCVD risk score Jose Bussing DC Jr., et al., 2013) is: 14.8%   Values used to calculate the score:     Age: 30 years     Sex: Male     Is Non-Hispanic African American: No     Diabetic: No     Tobacco smoker: No     Systolic Blood Pressure: 485 mmHg     Is BP treated: No     HDL Cholesterol: 58 mg/dL     Total Cholesterol: 130 mg/dL   Functional Status Survey: Is the patient deaf or have difficulty hearing?: No Does the patient have difficulty seeing, even when wearing glasses/contacts?: No Does the patient have difficulty concentrating, remembering, or making decisions?: No Does the patient have difficulty walking or climbing stairs?: No Does the patient have difficulty dressing or bathing?: No Does the patient have difficulty doing errands alone such as visiting a doctor's office or shopping?: No  FALL RISK: Fall Risk  08/30/2019 08/28/2018 07/16/2018 07/05/2017 07/04/2016  Falls in the past year? 0 1 0 Yes No  Number falls in past yr: 0 0 - 1 -  Injury with Fall? 0 1 - Yes -  Follow up Falls evaluation completed Falls evaluation completed - Falls  evaluation completed -    Depression Screen Depression screen [facility] 2/9 08/30/2019 08/28/2018 07/16/2018 07/05/2017 07/04/2016  Decreased Interest 0 0 0 0 0  Down, Depressed, Hopeless 0 0 0 0 0  PHQ - 2 Score 0 0 0 0 0  Altered sleeping - 0 - - -  Tired, decreased energy - 0 - - -  Change in appetite - 0 - - -  Feeling bad or failure about yourself  - 0 - - -  Trouble concentrating - 0 - - -  Moving slowly or fidgety/restless - 0 - - -  Suicidal thoughts - 0 - - -  PHQ-9 Score - 0 - - -  Difficult doing work/chores - Not difficult at all - - -    Advanced Directives <no information>  Past Medical History:  Past Medical History:  Diagnosis Date  . Asthma    as a child  . Basal cell carcinoma (BCC) of scalp 08/2015  . Collapsed lung 02/2017  . Family history of adverse reaction to anesthesia    brother-PT UNSURE BUT WILL FIND OUT REACTION  . GERD (gastroesophageal reflux disease)    occ  . Osteoporosis     Surgical History:  Past Surgical History:  Procedure Laterality Date  . broken collar bone  2002/2007  . FRACTURE SURGERY  2007   broken left thigh  bone  . FRACTURE SURGERY  2007   broken left upper arm  . HERNIA REPAIR    . LEG SURGERY    . ORIF CALCANEOUS FRACTURE Right 12/29/2017   Procedure: OPEN REDUCTION INTERNAL FIXATION (ORIF) CALCANEOUS FRACTURE;  Surgeon: Samara Deist, DPM;  Location: [facility] ORS;  Service: Podiatry;  Laterality: Right;    Medications:  Current Outpatient Medications on File Prior to Visit  Medication Sig  . calcium-vitamin D (OSCAL WITH D) 500-200 MG-UNIT tablet Take 1 tablet by mouth.  . docusate sodium (COLACE) 100 MG capsule Take 200 mg by mouth at bedtime.  . finasteride (PROSCAR) 5 MG tablet Take 1 tablet (5 mg total) by mouth daily.  . Multiple Vitamin (MULTIVITAMIN) tablet Take 1 tablet by mouth daily.   No current facility-administered medications on file prior to visit.    Allergies:  No Known Allergies  Social History:    Social History   Socioeconomic History  . Marital status: Widowed    Spouse name: Not on file  . Number of children: Not on file  . Years of education: 2 associate degreee   . Highest education level: Associate degree: academic program  Occupational History  . Occupation: retired   Tobacco Use  . Smoking status: Former Smoker    Years: 4.00    Types: Cigarettes    Quit date: 02/10/1969    Years since quitting: 50.5  . Smokeless tobacco: Never Used  Vaping Use  . Vaping Use: Never used  Substance and Sexual Activity  . Alcohol use: No  . Drug use: No  . Sexual activity: Not on file  Other Topics Concern  . Not on file  Social History Narrative  . Not on file   Social Determinants of Health   Financial Resource Strain:   . Difficulty of Paying Living Expenses:   Food Insecurity:   . Worried About Charity fundraiser in the Last Year:   . Arboriculturist in the Last Year:   Transportation Needs:   . Film/video editor (Medical):   Marland Kitchen Lack of Transportation (Non-Medical):   Physical Activity:   . Days of Exercise per Week:   . Minutes of Exercise per Session:   Stress:   . Feeling of Stress :   Social Connections:   . Frequency of Communication with Friends and Family:   . Frequency of Social Gatherings with Friends and Family:   . Attends Religious Services:   . Active Member of Clubs or Organizations:   . Attends Archivist Meetings:   Marland Kitchen Marital Status:   Intimate Partner Violence:   . Fear of Current or Ex-Partner:   . Emotionally Abused:   Marland Kitchen Physically Abused:   . Sexually Abused:    Social History   Tobacco Use  Smoking Status Former Smoker  . Years: 4.00  . Types: Cigarettes  . Quit date: 02/10/1969  . Years since quitting: 50.5  Smokeless Tobacco Never Used   Social History   Substance and Sexual Activity  Alcohol Use No    Family History:  Family History  Problem Relation Age of Onset  . Congestive Heart Failure Mother   .  Cancer Paternal Grandfather        leukemia    Past medical history, surgical history, medications, allergies, family history and social history reviewed with patient today and changes made to appropriate areas of the chart.   Review of Systems - negative All other ROS negative except what  is listed above and in the HPI.      Objective:    BP 109/73   Pulse 71   Temp 98.3 F (36.8 C) (Oral)   Ht 5\' 6"  (1.676 m)   Wt 137 lb 9.6 oz (62.4 kg)   SpO2 96%   BMI 22.21 kg/m   Wt Readings from Last 3 Encounters:  08/30/19 137 lb 9.6 oz (62.4 kg)  08/28/18 136 lb (61.7 kg)  07/16/18 137 lb (62.1 kg)    Physical Exam Vitals and nursing note reviewed.  Constitutional:      General: He is awake. He is not in acute distress.    Appearance: He is well-developed and well-groomed. He is not ill-appearing.  HENT:     Head: Normocephalic and atraumatic.     Right Ear: Hearing, tympanic membrane, ear canal and external ear normal. No drainage.     Left Ear: Hearing, tympanic membrane, ear canal and external ear normal. No drainage.     Nose: Nose normal.     Mouth/Throat:     Pharynx: Uvula midline.  Eyes:     General: Lids are normal.        Right eye: No discharge.        Left eye: No discharge.     Extraocular Movements: Extraocular movements intact.     Conjunctiva/sclera: Conjunctivae normal.     Pupils: Pupils are equal, round, and reactive to light.     Visual Fields: Right eye visual fields normal and left eye visual fields normal.  Neck:     Thyroid: No thyromegaly.     Vascular: No carotid bruit or JVD.     Trachea: Trachea normal.  Cardiovascular:     Rate and Rhythm: Normal rate and regular rhythm.     Heart sounds: Normal heart sounds, S1 normal and S2 normal. No murmur heard.  No gallop.   Pulmonary:     Effort: Pulmonary effort is normal. No accessory muscle usage or respiratory distress.     Breath sounds: Normal breath sounds.  Abdominal:     General: Bowel  sounds are normal.     Palpations: Abdomen is soft. There is no hepatomegaly or splenomegaly.     Tenderness: There is no abdominal tenderness.  Genitourinary:    Penis: Normal.      Testes: Normal.     Prostate: Normal.     Rectum: Normal.     Comments: Refused chaperone Musculoskeletal:        General: Normal range of motion.     Cervical back: Normal range of motion and neck supple.     Right lower leg: No edema.     Left lower leg: No edema.  Lymphadenopathy:     Head:     Right side of head: No submental, submandibular, tonsillar, preauricular or posterior auricular adenopathy.     Left side of head: No submental, submandibular, tonsillar, preauricular or posterior auricular adenopathy.     Cervical: No cervical adenopathy.  Skin:    General: Skin is warm and dry.     Capillary Refill: Capillary refill takes less than 2 seconds.     Findings: No rash.  Neurological:     Mental Status: He is alert and oriented to person, place, and time.     Cranial Nerves: Cranial nerves are intact.     Gait: Gait is intact.     Deep Tendon Reflexes: Reflexes are normal and symmetric.     Reflex Scores:  Brachioradialis reflexes are 2+ on the right side and 2+ on the left side.      Patellar reflexes are 2+ on the right side and 2+ on the left side. Psychiatric:        Attention and Perception: Attention normal.        Mood and Affect: Mood normal.        Speech: Speech normal.        Behavior: Behavior normal. Behavior is cooperative.        Thought Content: Thought content normal.        Cognition and Memory: Cognition normal.        Judgment: Judgment normal.    Results for orders placed or performed in visit on 08/28/18  Comprehensive metabolic panel  Result Value Ref Range   Glucose 85 65 - 99 mg/dL   BUN 15 8 - 27 mg/dL   Creatinine, Ser 0.72 (L) 0.76 - 1.27 mg/dL   GFR calc non Af Amer 93 >59 mL/min/1.73   GFR calc Af Amer 107 >59 mL/min/1.73   BUN/Creatinine Ratio 21  10 - 24   Sodium 142 134 - 144 mmol/L   Potassium 4.8 3.5 - 5.2 mmol/L   Chloride 104 96 - 106 mmol/L   CO2 27 20 - 29 mmol/L   Calcium 9.3 8.6 - 10.2 mg/dL   Total Protein 6.4 6.0 - 8.5 g/dL   Albumin 4.2 3.7 - 4.7 g/dL   Globulin, Total 2.2 1.5 - 4.5 g/dL   Albumin/Globulin Ratio 1.9 1.2 - 2.2   Bilirubin Total 0.4 0.0 - 1.2 mg/dL   Alkaline Phosphatase 57 39 - 117 IU/L   AST 25 0 - 40 IU/L   ALT 17 0 - 44 IU/L  CBC with Differential/Platelet  Result Value Ref Range   WBC 4.1 3.4 - 10.8 x10E3/uL   RBC 3.76 (L) 4.14 - 5.80 x10E6/uL   Hemoglobin 11.7 (L) 13.0 - 17.7 g/dL   Hematocrit 35.0 (L) 37.5 - 51.0 %   MCV 93 79 - 97 fL   MCH 31.1 26.6 - 33.0 pg   MCHC 33.4 31 - 35 g/dL   RDW 11.8 11.6 - 15.4 %   Platelets 211 150 - 450 x10E3/uL   Neutrophils 48 Not Estab. %   Lymphs 39 Not Estab. %   Monocytes 9 Not Estab. %   Eos 3 Not Estab. %   Basos 1 Not Estab. %   Neutrophils Absolute 1.9 1 - 7 x10E3/uL   Lymphocytes Absolute 1.6 0 - 3 x10E3/uL   Monocytes Absolute 0.4 0 - 0 x10E3/uL   EOS (ABSOLUTE) 0.1 0.0 - 0.4 x10E3/uL   Basophils Absolute 0.0 0 - 0 x10E3/uL   Immature Granulocytes 0 Not Estab. %   Immature Grans (Abs) 0.0 0.0 - 0.1 x10E3/uL  Lipid Panel w/o Chol/HDL Ratio  Result Value Ref Range   Cholesterol, Total 130 100 - 199 mg/dL   Triglycerides 65 0 - 149 mg/dL   HDL 58 >39 mg/dL   VLDL Cholesterol Cal 13 5 - 40 mg/dL   LDL Calculated 59 0 - 99 mg/dL  PSA  Result Value Ref Range   Prostate Specific Ag, Serum 1.5 0.0 - 4.0 ng/mL  TSH  Result Value Ref Range   TSH 1.810 0.450 - 4.500 uIU/mL      Assessment & Plan:   Problem List Items Addressed This Visit      Genitourinary   BPH (benign prostatic hyperplasia)    Chronic, stable.  Continue current medication regimen and adjust as needed.  Would like PSA checked today.      Relevant Orders   PSA    Other Visit Diagnoses    Physical exam, annual    -  Primary   Annual labs to include TSH, CBC,  CMP, PSA, lipid   Relevant Orders   CBC with Differential/Platelet   Comprehensive metabolic panel   Screening cholesterol level       Lipid panel today for screening, recent LDL 59.   Relevant Orders   Lipid Panel w/o Chol/HDL Ratio   Thyroid disorder screen       TSH check today.   Relevant Orders   TSH      Discussed aspirin prophylaxis for myocardial infarction prevention and decision was it was not indicated  LABORATORY TESTING:  Health maintenance labs ordered today as discussed above.   The natural history of prostate cancer and ongoing controversy regarding screening and potential treatment outcomes of prostate cancer has been discussed with the patient. The meaning of a false positive PSA and a false negative PSA has been discussed. He indicates understanding of the limitations of this screening test and wishes to proceed with screening PSA testing.   IMMUNIZATIONS:   - Tdap: Tetanus vaccination status reviewed: last tetanus booster within 10 years. - Influenza: Up to date - Pneumovax: Refused -- may have had a [facility] - Prevnar: Refused - Zostavax vaccine: ordered  SCREENING: - Colonoscopy: Up to date  Discussed with patient purpose of the colonoscopy is to detect colon cancer at curable precancerous or early stages   - AAA Screening: Not applicable  -Hearing Test: Not applicable  -Spirometry: Not applicable   PATIENT COUNSELING:    Sexuality: Discussed sexually transmitted diseases, partner selection, use of condoms, avoidance of unintended pregnancy  and contraceptive alternatives.   Advised to avoid cigarette smoking.  I discussed with the patient that most people either abstain from alcohol or drink within safe limits (<=14/week and <=4 drinks/occasion for males, <=7/weeks and <= 3 drinks/occasion for females) and that the risk for alcohol disorders and other health effects rises proportionally with the number of drinks per week and how often a drinker exceeds  daily limits.  Discussed cessation/primary prevention of drug use and availability of treatment for abuse.   Diet: Encouraged to adjust caloric intake to maintain  or achieve ideal body weight, to reduce intake of dietary saturated fat and total fat, to limit sodium intake by avoiding high sodium foods and not adding table salt, and to maintain adequate dietary potassium and calcium preferably from fresh fruits, vegetables, and low-fat dairy products.    stressed the importance of regular exercise  Injury prevention: Discussed safety belts, safety helmets, smoke detector, smoking near bedding or upholstery.   Dental health: Discussed importance of regular tooth brushing, flossing, and dental visits.   Follow up plan: NEXT PREVENTATIVE PHYSICAL DUE IN 1 YEAR. Return in about 1 year (around 08/29/2020) for Annual physical.

## 2019-08-30 NOTE — Patient Instructions (Signed)
Healthy Eating Following a healthy eating pattern may help you to achieve and maintain a healthy body weight, reduce the risk of chronic disease, and live a long and productive life. It is important to follow a healthy eating pattern at an appropriate calorie level for your body. Your nutritional needs should be met primarily through food by choosing a variety of nutrient-rich foods. What are tips for following this plan? Reading food labels  Read labels and choose the following: ? Reduced or low sodium. ? Juices with 100% fruit juice. ? Foods with low saturated fats and high polyunsaturated and monounsaturated fats. ? Foods with whole grains, such as whole wheat, cracked wheat, brown rice, and wild rice. ? Whole grains that are fortified with folic acid. This is recommended for women who are pregnant or who want to become pregnant.  Read labels and avoid the following: ? Foods with a lot of added sugars. These include foods that contain brown sugar, corn sweetener, corn syrup, dextrose, fructose, glucose, high-fructose corn syrup, honey, invert sugar, lactose, malt syrup, maltose, molasses, raw sugar, sucrose, trehalose, or turbinado sugar.  Do not eat more than the following amounts of added sugar per day:  6 teaspoons (25 g) for women.  9 teaspoons (38 g) for men. ? Foods that contain processed or refined starches and grains. ? Refined grain products, such as white flour, degermed cornmeal, white bread, and white rice. Shopping  Choose nutrient-rich snacks, such as vegetables, whole fruits, and nuts. Avoid high-calorie and high-sugar snacks, such as potato chips, fruit snacks, and candy.  Use oil-based dressings and spreads on foods instead of solid fats such as butter, stick margarine, or cream cheese.  Limit pre-made sauces, mixes, and "instant" products such as flavored rice, instant noodles, and ready-made pasta.  Try more plant-protein sources, such as tofu, tempeh, black beans,  edamame, lentils, nuts, and seeds.  Explore eating plans such as the Mediterranean diet or vegetarian diet. Cooking  Use oil to saut or stir-fry foods instead of solid fats such as butter, stick margarine, or lard.  Try baking, boiling, grilling, or broiling instead of frying.  Remove the fatty part of meats before cooking.  Steam vegetables in water or broth. Meal planning   At meals, imagine dividing your plate into fourths: ? One-half of your plate is fruits and vegetables. ? One-fourth of your plate is whole grains. ? One-fourth of your plate is protein, especially lean meats, poultry, eggs, tofu, beans, or nuts.  Include low-fat dairy as part of your daily diet. Lifestyle  Choose healthy options in all settings, including home, work, school, restaurants, or stores.  Prepare your food safely: ? Wash your hands after handling raw meats. ? Keep food preparation surfaces clean by regularly washing with hot, soapy water. ? Keep raw meats separate from ready-to-eat foods, such as fruits and vegetables. ? Cook seafood, meat, poultry, and eggs to the recommended internal temperature. ? Store foods at safe temperatures. In general:  Keep cold foods at 59F (4.4C) or below.  Keep hot foods at 159F (60C) or above.  Keep your freezer at [facility] (-17.8C) or below.  Foods are no longer safe to eat when they have been between the temperatures of 40-159F (4.4-60C) for more than 2 hours. What foods should I eat? Fruits Aim to eat 2 cup-equivalents of fresh, canned (in natural juice), or frozen fruits each day. Examples of 1 cup-equivalent of fruit include 1 small apple, 8 large strawberries, 1 cup canned fruit,  cup  dried fruit, or 1 cup 100% juice. Vegetables Aim to eat 2-3 cup-equivalents of fresh and frozen vegetables each day, including different varieties and colors. Examples of 1 cup-equivalent of vegetables include 2 medium carrots, 2 cups raw, leafy greens, 1 cup chopped  vegetable (raw or cooked), or 1 medium baked potato. Grains Aim to eat 6 ounce-equivalents of whole grains each day. Examples of 1 ounce-equivalent of grains include 1 slice of bread, 1 cup ready-to-eat cereal, 3 cups popcorn, or  cup cooked rice, pasta, or cereal. Meats and other proteins Aim to eat 5-6 ounce-equivalents of protein each day. Examples of 1 ounce-equivalent of protein include 1 egg, 1/2 cup nuts or seeds, or 1 tablespoon (16 g) peanut butter. A cut of meat or fish that is the size of a deck of cards is about 3-4 ounce-equivalents.  Of the protein you eat each week, try to have at least 8 ounces come from seafood. This includes salmon, trout, herring, and anchovies. Dairy Aim to eat 3 cup-equivalents of fat-free or low-fat dairy each day. Examples of 1 cup-equivalent of dairy include 1 cup (240 mL) milk, 8 ounces (250 g) yogurt, 1 ounces (44 g) natural cheese, or 1 cup (240 mL) fortified soy milk. Fats and oils  Aim for about 5 teaspoons (21 g) per day. Choose monounsaturated fats, such as canola and olive oils, avocados, peanut butter, and most nuts, or polyunsaturated fats, such as sunflower, corn, and soybean oils, walnuts, pine nuts, sesame seeds, sunflower seeds, and flaxseed. Beverages  Aim for six 8-oz glasses of water per day. Limit coffee to three to five 8-oz cups per day.  Limit caffeinated beverages that have added calories, such as soda and energy drinks.  Limit alcohol intake to no more than 1 drink a day for nonpregnant women and 2 drinks a day for men. One drink equals 12 oz of beer (355 mL), 5 oz of wine (148 mL), or 1 oz of hard liquor (44 mL). Seasoning and other foods  Avoid adding excess amounts of salt to your foods. Try flavoring foods with herbs and spices instead of salt.  Avoid adding sugar to foods.  Try using oil-based dressings, sauces, and spreads instead of solid fats. This information is based on general [place] nutrition guidelines. For more  information, visit [employer]. Exact amounts may vary based on your nutrition needs. Summary  A healthy eating plan may help you to maintain a healthy weight, reduce the risk of chronic diseases, and stay active throughout your life.  Plan your meals. Make sure you eat the right portions of a variety of nutrient-rich foods.  Try baking, boiling, grilling, or broiling instead of frying.  Choose healthy options in all settings, including home, work, school, restaurants, or stores. This information is not intended to replace advice given to you by your health care provider. Make sure you discuss any questions you have with your health care provider. Document Revised: 06/05/2017 Document Reviewed: 06/05/2017 Elsevier Patient Education  [place].

## 2019-08-30 NOTE — Assessment & Plan Note (Signed)
Chronic, stable.  Continue current medication regimen and adjust as needed.  Would like PSA checked today.

## 2019-08-31 LAB — COMPREHENSIVE METABOLIC PANEL
ALT: 20 IU/L (ref 0–44)
AST: 28 IU/L (ref 0–40)
Albumin/Globulin Ratio: 1.9 (ref 1.2–2.2)
Albumin: 4.5 g/dL (ref 3.7–4.7)
Alkaline Phosphatase: 57 IU/L (ref 48–121)
BUN/Creatinine Ratio: 33 — ABNORMAL HIGH (ref 10–24)
BUN: 28 mg/dL — ABNORMAL HIGH (ref 8–27)
Bilirubin Total: 0.4 mg/dL (ref 0.0–1.2)
CO2: 22 mmol/L (ref 20–29)
Calcium: 9.6 mg/dL (ref 8.6–10.2)
Chloride: 106 mmol/L (ref 96–106)
Creatinine, Ser: 0.85 mg/dL (ref 0.76–1.27)
GFR calc Af Amer: 99 mL/min/{1.73_m2} (ref >59)
GFR calc non Af Amer: 86 mL/min/{1.73_m2} (ref >59)
Globulin, Total: 2.4 g/dL (ref 1.5–4.5)
Glucose: 90 mg/dL (ref 65–99)
Potassium: 4.3 mmol/L (ref 3.5–5.2)
Sodium: 141 mmol/L (ref 134–144)
Total Protein: 6.9 g/dL (ref 6.0–8.5)

## 2019-08-31 LAB — CBC WITH DIFFERENTIAL/PLATELET
Basophils Absolute: 0 10*3/uL (ref 0.0–0.2)
Basos: 1 %
EOS (ABSOLUTE): 0.1 10*3/uL (ref 0.0–0.4)
Eos: 1 %
Hematocrit: 36.9 % — ABNORMAL LOW (ref 37.5–51.0)
Hemoglobin: 12.5 g/dL — ABNORMAL LOW (ref 13.0–17.7)
Immature Grans (Abs): 0 10*3/uL (ref 0.0–0.1)
Immature Granulocytes: 0 %
Lymphocytes Absolute: 1.8 10*3/uL (ref 0.7–3.1)
Lymphs: 26 %
MCH: 31.5 pg (ref 26.6–33.0)
MCHC: 33.9 g/dL (ref 31.5–35.7)
MCV: 93 fL (ref 79–97)
Monocytes Absolute: 0.4 10*3/uL (ref 0.1–0.9)
Monocytes: 6 %
Neutrophils Absolute: 4.5 10*3/uL (ref 1.4–7.0)
Neutrophils: 66 %
Platelets: 194 10*3/uL (ref 150–450)
RBC: 3.97 x10E6/uL — ABNORMAL LOW (ref 4.14–5.80)
RDW: 12 % (ref 11.6–15.4)
WBC: 6.9 10*3/uL (ref 3.4–10.8)

## 2019-08-31 LAB — TSH: TSH: 1.65 u[IU]/mL (ref 0.450–4.500)

## 2019-08-31 LAB — LIPID PANEL W/O CHOL/HDL RATIO
Cholesterol, Total: 145 mg/dL (ref 100–199)
HDL: 65 mg/dL (ref >39)
LDL Chol Calc (NIH): 69 mg/dL (ref 0–99)
Triglycerides: 52 mg/dL (ref 0–149)
VLDL Cholesterol Cal: 11 mg/dL (ref 5–40)

## 2019-08-31 LAB — PSA: Prostate Specific Ag, Serum: 1.5 ng/mL (ref 0.0–4.0)

## 2019-09-01 NOTE — Progress Notes (Signed)
Contacted via [place] morning Lanny Hurst, your labs have returned.  Your hemoglobin and hematocrit continue to be on lower side, but improved from previous.  Do you take an iron at home?  I would recommend taking this since you do cycle a lot + take a multivitamin daily.  Remainder of labs remain in normal ranges.  Great job!! Keep being awesome!! Kindest regards, Bani Gianfrancesco

## 2019-09-02 ENCOUNTER — Encounter: Payer: PPO | Admitting: Family Medicine

## 2019-09-16 DIAGNOSIS — D225 Melanocytic nevi of trunk: Secondary | ICD-10-CM | POA: Diagnosis not present

## 2019-09-16 DIAGNOSIS — D2272 Melanocytic nevi of left lower limb, including hip: Secondary | ICD-10-CM | POA: Diagnosis not present

## 2019-09-16 DIAGNOSIS — L570 Actinic keratosis: Secondary | ICD-10-CM | POA: Diagnosis not present

## 2019-09-16 DIAGNOSIS — D2261 Melanocytic nevi of right upper limb, including shoulder: Secondary | ICD-10-CM | POA: Diagnosis not present

## 2019-09-16 DIAGNOSIS — D2262 Melanocytic nevi of left upper limb, including shoulder: Secondary | ICD-10-CM | POA: Diagnosis not present

## 2019-09-16 DIAGNOSIS — D485 Neoplasm of uncertain behavior of skin: Secondary | ICD-10-CM | POA: Diagnosis not present

## 2019-09-16 DIAGNOSIS — X32XXXA Exposure to sunlight, initial encounter: Secondary | ICD-10-CM | POA: Diagnosis not present

## 2019-09-16 DIAGNOSIS — Z85828 Personal history of other malignant neoplasm of skin: Secondary | ICD-10-CM | POA: Diagnosis not present

## 2019-09-16 DIAGNOSIS — C44519 Basal cell carcinoma of skin of other part of trunk: Secondary | ICD-10-CM | POA: Diagnosis not present

## 2019-10-03 ENCOUNTER — Telehealth: Payer: Self-pay | Admitting: Nurse Practitioner

## 2019-10-03 NOTE — Telephone Encounter (Signed)
Copied from [place] [id number]. Topic: Medicare AWV >> Oct 03, 2019 10:00 AM Cher Nakai R wrote: Reason for CRM:   Left message for patient to call back and schedule the Medicare Annual Wellness Visit (AWV) virtually.  Last AWV 07/16/2018  Please schedule at anytime with [facility]-Nurse Health Advisor.  45 minute appointment

## 2019-10-22 DIAGNOSIS — C44519 Basal cell carcinoma of skin of other part of trunk: Secondary | ICD-10-CM | POA: Diagnosis not present

## 2020-02-11 ENCOUNTER — Telehealth: Payer: Self-pay | Admitting: Nurse Practitioner

## 2020-02-11 NOTE — Telephone Encounter (Signed)
Copied from [place] [id number]. Topic: Medicare AWV >> Feb 11, 2020  2:54 PM Cher Nakai R wrote: Reason for CRM:   Left message for patient to call back and schedule the Medicare Annual Wellness Visit (AWV) virtually.  Last AWV 07/16/2018  Please schedule at anytime with [facility]-Nurse Health Advisor.  45 minute appointment  Any questions, please call me at [phone]

## 2020-03-02 ENCOUNTER — Other Ambulatory Visit: Payer: Self-pay | Admitting: Nurse Practitioner

## 2020-03-02 DIAGNOSIS — N401 Enlarged prostate with lower urinary tract symptoms: Secondary | ICD-10-CM

## 2020-03-02 NOTE — Telephone Encounter (Signed)
Approved per protocol.  Requested Prescriptions  Pending Prescriptions Disp Refills  . finasteride (PROSCAR) 5 MG tablet [Pharmacy Med Name: FINASTERIDE 5 MG TAB] 90 tablet 0    Sig: TAKE ONE TABLET EVERY DAY     Urology: 5-alpha Reductase Inhibitors Passed - 03/02/2020 10:19 AM      Passed - Valid encounter within last 12 months    Recent Outpatient Visits          6 months ago Physical exam, annual   [facility] [place], Pukalani T, NP   1 year ago Encounter for annual physical exam   [facility] [place], Corrie Dandy T, NP   2 years ago Iron deficiency anemia, unspecified iron deficiency anemia type   [facility] [place], Redge Gainer, MD   3 years ago PE (physical exam), annual   [facility] [place], Redge Gainer, MD      Future Appointments            In 6 months [place], Dorie Rank, NP [facility]

## 2020-03-12 DIAGNOSIS — R42 Dizziness and giddiness: Secondary | ICD-10-CM | POA: Diagnosis not present

## 2020-03-18 ENCOUNTER — Telehealth: Payer: Self-pay | Admitting: Nurse Practitioner

## 2020-03-18 NOTE — Telephone Encounter (Signed)
Copied from [place] [id number]. Topic: Medicare AWV >> Mar 18, 2020  1:55 PM Cher Nakai R wrote: Reason for CRM:  Left message for patient to call back and schedule the Medicare Annual Wellness Visit (AWV) virtually.  Last AWV 07/16/2018  Please schedule at anytime with [facility]-Nurse Health Advisor.  45 minute appointment  Any questions, please call me at [phone]

## 2020-03-26 DIAGNOSIS — L570 Actinic keratosis: Secondary | ICD-10-CM | POA: Diagnosis not present

## 2020-03-26 DIAGNOSIS — D2271 Melanocytic nevi of right lower limb, including hip: Secondary | ICD-10-CM | POA: Diagnosis not present

## 2020-03-26 DIAGNOSIS — D485 Neoplasm of uncertain behavior of skin: Secondary | ICD-10-CM | POA: Diagnosis not present

## 2020-03-26 DIAGNOSIS — C44519 Basal cell carcinoma of skin of other part of trunk: Secondary | ICD-10-CM | POA: Diagnosis not present

## 2020-03-26 DIAGNOSIS — Z85828 Personal history of other malignant neoplasm of skin: Secondary | ICD-10-CM | POA: Diagnosis not present

## 2020-03-26 DIAGNOSIS — X32XXXA Exposure to sunlight, initial encounter: Secondary | ICD-10-CM | POA: Diagnosis not present

## 2020-03-26 DIAGNOSIS — D2262 Melanocytic nevi of left upper limb, including shoulder: Secondary | ICD-10-CM | POA: Diagnosis not present

## 2020-03-26 DIAGNOSIS — D2261 Melanocytic nevi of right upper limb, including shoulder: Secondary | ICD-10-CM | POA: Diagnosis not present

## 2020-03-26 DIAGNOSIS — D225 Melanocytic nevi of trunk: Secondary | ICD-10-CM | POA: Diagnosis not present

## 2020-04-27 DIAGNOSIS — C44519 Basal cell carcinoma of skin of other part of trunk: Secondary | ICD-10-CM | POA: Diagnosis not present

## 2020-05-04 DIAGNOSIS — G8929 Other chronic pain: Secondary | ICD-10-CM | POA: Diagnosis not present

## 2020-05-04 DIAGNOSIS — M542 Cervicalgia: Secondary | ICD-10-CM | POA: Diagnosis not present

## 2020-05-04 DIAGNOSIS — M5030 Other cervical disc degeneration, unspecified cervical region: Secondary | ICD-10-CM | POA: Diagnosis not present

## 2020-05-04 DIAGNOSIS — M47812 Spondylosis without myelopathy or radiculopathy, cervical region: Secondary | ICD-10-CM | POA: Diagnosis not present

## 2020-05-27 DIAGNOSIS — I700 Atherosclerosis of aorta: Secondary | ICD-10-CM | POA: Insufficient documentation

## 2020-06-05 ENCOUNTER — Other Ambulatory Visit: Payer: Self-pay | Admitting: Nurse Practitioner

## 2020-06-05 DIAGNOSIS — N401 Enlarged prostate with lower urinary tract symptoms: Secondary | ICD-10-CM

## 2020-06-05 DIAGNOSIS — M542 Cervicalgia: Secondary | ICD-10-CM | POA: Diagnosis not present

## 2020-07-09 DIAGNOSIS — M542 Cervicalgia: Secondary | ICD-10-CM | POA: Diagnosis not present

## 2020-07-10 DIAGNOSIS — M542 Cervicalgia: Secondary | ICD-10-CM | POA: Diagnosis not present

## 2020-07-27 DIAGNOSIS — M542 Cervicalgia: Secondary | ICD-10-CM | POA: Diagnosis not present

## 2020-08-05 DIAGNOSIS — M542 Cervicalgia: Secondary | ICD-10-CM | POA: Diagnosis not present

## 2020-08-12 DIAGNOSIS — M542 Cervicalgia: Secondary | ICD-10-CM | POA: Diagnosis not present

## 2020-08-19 DIAGNOSIS — M542 Cervicalgia: Secondary | ICD-10-CM | POA: Diagnosis not present

## 2020-09-02 ENCOUNTER — Ambulatory Visit (INDEPENDENT_AMBULATORY_CARE_PROVIDER_SITE_OTHER): Payer: PPO | Admitting: Nurse Practitioner

## 2020-09-02 ENCOUNTER — Other Ambulatory Visit: Payer: Self-pay

## 2020-09-02 ENCOUNTER — Encounter: Payer: Self-pay | Admitting: Nurse Practitioner

## 2020-09-02 VITALS — BP 124/69 | HR 62 | Temp 98.3°F | Ht 65.5 in | Wt 135.6 lb

## 2020-09-02 DIAGNOSIS — Z23 Encounter for immunization: Secondary | ICD-10-CM

## 2020-09-02 DIAGNOSIS — Z0000 Encounter for general adult medical examination without abnormal findings: Secondary | ICD-10-CM

## 2020-09-02 DIAGNOSIS — N400 Enlarged prostate without lower urinary tract symptoms: Secondary | ICD-10-CM

## 2020-09-02 DIAGNOSIS — I700 Atherosclerosis of aorta: Secondary | ICD-10-CM

## 2020-09-02 MED ORDER — FINASTERIDE 5 MG PO TABS: 5.0000 mg | ORAL_TABLET | Freq: Every morning | ORAL | 4 refills | Status: DC

## 2020-09-02 MED ORDER — SHINGRIX 50 MCG/0.5ML IM SUSR: 0.5000 mL | Freq: Once | INTRAMUSCULAR | 0 refills | Status: AC

## 2020-09-02 NOTE — Assessment & Plan Note (Signed)
Chronic, stable.  Continue current medication regimen and adjust as needed.  Would like PSA checked today and annually, is aware of guideline recommendations and wishes to continue blood checks. 

## 2020-09-02 NOTE — Progress Notes (Signed)
BP 124/69   Pulse 62   Temp 98.3 F (36.8 C) (Oral)   Ht 5' 5.5" (1.664 m)   Wt 135 lb 9.6 oz (61.5 kg)   SpO2 97%   BMI 22.22 kg/m    Subjective:    Patient ID: Jose [name], male    DOB: [birth year], 75 y.o.   MRN: [id number]  HPI: Jose [name] is a 75 y.o. male presenting on 09/02/2020 for comprehensive medical examination. Current medical complaints include:none  He currently lives with: wife Interim Problems from his last visit: no   BPH Continues on Finasteride 5 MG at bedtime. BPH status: controlled Satisfied with current treatment?: yes Medication side effects: no Medication compliance: good compliance Duration: chronic Nocturia: 1/night Urinary frequency:no Incomplete voiding: no Urgency: no Weak urinary stream: no Straining to start stream: no Dysuria: no Onset: gradual Severity: mild  The 10-year ASCVD risk score Mikey Bussing DC Jr., et al., 2013) is: 21.3%   Values used to calculate the score:     Age: 22 years     Sex: Male     Is Non-Hispanic African American: No     Diabetic: No     Tobacco smoker: Yes     Systolic Blood Pressure: 903 mmHg     Is BP treated: No     HDL Cholesterol: 65 mg/dL     Total Cholesterol: 145 mg/dL  Functional Status Survey: Is the patient deaf or have difficulty hearing?: No Does the patient have difficulty seeing, even when wearing glasses/contacts?: No Does the patient have difficulty concentrating, remembering, or making decisions?: No Does the patient have difficulty walking or climbing stairs?: No Does the patient have difficulty dressing or bathing?: No Does the patient have difficulty doing errands alone such as visiting a doctor's office or shopping?: No  FALL RISK: Fall Risk  09/02/2020 08/30/2019 08/28/2018 07/16/2018 07/05/2017  Falls in the past year? 0 0 1 0 Yes  Number falls in past yr: 0 0 0 - 1  Injury with Fall? 0 0 1 - Yes  Risk for fall due to : No Fall Risks - - - -  Follow up Falls evaluation  completed Falls evaluation completed Falls evaluation completed - Falls evaluation completed    Depression Screen Depression screen [facility] 2/9 09/02/2020 08/30/2019 08/28/2018 07/16/2018 07/05/2017  Decreased Interest 0 0 0 0 0  Down, Depressed, Hopeless 0 0 0 0 0  PHQ - 2 Score 0 0 0 0 0  Altered sleeping - - 0 - -  Tired, decreased energy - - 0 - -  Change in appetite - - 0 - -  Feeling bad or failure about yourself  - - 0 - -  Trouble concentrating - - 0 - -  Moving slowly or fidgety/restless - - 0 - -  Suicidal thoughts - - 0 - -  PHQ-9 Score - - 0 - -  Difficult doing work/chores - - Not difficult at all - -    Advanced Directives <no information>  Past Medical History:  Past Medical History:  Diagnosis Date   Asthma    as a child   Basal cell carcinoma (BCC) of scalp 08/2015   Collapsed lung 02/2017   Family history of adverse reaction to anesthesia    brother-PT UNSURE BUT WILL FIND OUT REACTION   GERD (gastroesophageal reflux disease)    occ   Osteoporosis     Surgical History:  Past Surgical History:  Procedure Laterality Date   broken  collar bone  2002/2007   FRACTURE SURGERY  2007   broken left thigh bone   FRACTURE SURGERY  2007   broken left upper arm   HERNIA REPAIR     LEG SURGERY     ORIF CALCANEOUS FRACTURE Right 12/29/2017   Procedure: OPEN REDUCTION INTERNAL FIXATION (ORIF) CALCANEOUS FRACTURE;  Surgeon: Samara Deist, DPM;  Location: [facility] ORS;  Service: Podiatry;  Laterality: Right;    Medications:  Current Outpatient Medications on File Prior to Visit  Medication Sig   calcium-vitamin D (OSCAL WITH D) 500-200 MG-UNIT tablet Take 1 tablet by mouth.   docusate sodium (COLACE) 100 MG capsule Take 200 mg by mouth at bedtime.   meloxicam (MOBIC) 7.5 MG tablet meloxicam 7.5 mg tablet  TAKE ONE TABLET BY MOUTH EVERY DAY   MODERNA COVID-19 VACCINE 100 MCG/0.5ML injection    Multiple Vitamin (MULTIVITAMIN) tablet Take 1 tablet by mouth daily.   No  current facility-administered medications on file prior to visit.    Allergies:  No Known Allergies  Social History:  Social History   Socioeconomic History   Marital status: Widowed    Spouse name: Not on file   Number of children: Not on file   Years of education: 2 associate degreee    Highest education level: Associate degree: academic program  Occupational History   Occupation: retired   Tobacco Use   Smoking status: Former    Years: 4.00    Pack years: 0.00    Types: Cigarettes    Quit date: 02/10/1969    Years since quitting: 51.5   Smokeless tobacco: Never  Vaping Use   Vaping Use: Never used  Substance and Sexual Activity   Alcohol use: No   Drug use: No   Sexual activity: Not on file  Other Topics Concern   Not on file  Social History Narrative   Not on file   Social Determinants of Health   Financial Resource Strain: Not on file  Food Insecurity: Not on file  Transportation Needs: Not on file  Physical Activity: Not on file  Stress: Not on file  Social Connections: Not on file  Intimate Partner Violence: Not on file   Social History   Tobacco Use  Smoking Status Former   Years: 4.00   Pack years: 0.00   Types: Cigarettes   Quit date: 02/10/1969   Years since quitting: 51.5  Smokeless Tobacco Never   Social History   Substance and Sexual Activity  Alcohol Use No    Family History:  Family History  Problem Relation Age of Onset   Congestive Heart Failure Mother    Obesity Brother    Cancer Paternal Grandfather        leukemia    Past medical history, surgical history, medications, allergies, family history and social history reviewed with patient today and changes made to appropriate areas of the chart.   Review of Systems - negative All other ROS negative except what is listed above and in the HPI.      Objective:    BP 124/69   Pulse 62   Temp 98.3 F (36.8 C) (Oral)   Ht 5' 5.5" (1.664 m)   Wt 135 lb 9.6 oz (61.5 kg)    SpO2 97%   BMI 22.22 kg/m   Wt Readings from Last 3 Encounters:  09/02/20 135 lb 9.6 oz (61.5 kg)  08/30/19 137 lb 9.6 oz (62.4 kg)  08/28/18 136 lb (61.7 kg)    Physical  Exam Vitals and nursing note reviewed.  Constitutional:      General: He is awake. He is not in acute distress.    Appearance: He is well-developed and well-groomed. He is not ill-appearing.  HENT:     Head: Normocephalic and atraumatic.     Right Ear: Hearing, tympanic membrane, ear canal and external ear normal. No drainage.     Left Ear: Hearing, tympanic membrane, ear canal and external ear normal. No drainage.     Nose: Nose normal.     Mouth/Throat:     Pharynx: Uvula midline.  Eyes:     General: Lids are normal.        Right eye: No discharge.        Left eye: No discharge.     Extraocular Movements: Extraocular movements intact.     Conjunctiva/sclera: Conjunctivae normal.     Pupils: Pupils are equal, round, and reactive to light.     Visual Fields: Right eye visual fields normal and left eye visual fields normal.  Neck:     Thyroid: No thyromegaly.     Vascular: No carotid bruit or JVD.     Trachea: Trachea normal.  Cardiovascular:     Rate and Rhythm: Normal rate and regular rhythm.     Heart sounds: Normal heart sounds, S1 normal and S2 normal. No murmur heard.   No gallop.  Pulmonary:     Effort: Pulmonary effort is normal. No accessory muscle usage or respiratory distress.     Breath sounds: Normal breath sounds.  Abdominal:     General: Bowel sounds are normal.     Palpations: Abdomen is soft. There is no hepatomegaly or splenomegaly.     Tenderness: There is no abdominal tenderness.  Genitourinary:    Penis: Normal.      Testes: Normal.     Prostate: Normal.     Rectum: Normal.     Comments: Refused chaperone Musculoskeletal:        General: Normal range of motion.     Cervical back: Normal range of motion and neck supple.     Right lower leg: No edema.     Left lower leg: No  edema.  Lymphadenopathy:     Head:     Right side of head: No submental, submandibular, tonsillar, preauricular or posterior auricular adenopathy.     Left side of head: No submental, submandibular, tonsillar, preauricular or posterior auricular adenopathy.     Cervical: No cervical adenopathy.  Skin:    General: Skin is warm and dry.     Capillary Refill: Capillary refill takes less than 2 seconds.     Findings: No rash.  Neurological:     Mental Status: He is alert and oriented to person, place, and time.     Cranial Nerves: Cranial nerves are intact.     Gait: Gait is intact.     Deep Tendon Reflexes: Reflexes are normal and symmetric.     Reflex Scores:      Brachioradialis reflexes are 2+ on the right side and 2+ on the left side.      Patellar reflexes are 2+ on the right side and 2+ on the left side. Psychiatric:        Attention and Perception: Attention normal.        Mood and Affect: Mood normal.        Speech: Speech normal.        Behavior: Behavior normal. Behavior is cooperative.  Thought Content: Thought content normal.        Cognition and Memory: Cognition normal.        Judgment: Judgment normal.   Results for orders placed or performed in visit on 08/30/19  CBC with Differential/Platelet  Result Value Ref Range   WBC 6.9 3.4 - 10.8 x10E3/uL   RBC 3.97 (L) 4.14 - 5.80 x10E6/uL   Hemoglobin 12.5 (L) 13.0 - 17.7 g/dL   Hematocrit 36.9 (L) 37.5 - 51.0 %   MCV 93 79 - 97 fL   MCH 31.5 26.6 - 33.0 pg   MCHC 33.9 31.5 - 35.7 g/dL   RDW 12.0 11.6 - 15.4 %   Platelets 194 150 - 450 x10E3/uL   Neutrophils 66 Not Estab. %   Lymphs 26 Not Estab. %   Monocytes 6 Not Estab. %   Eos 1 Not Estab. %   Basos 1 Not Estab. %   Neutrophils Absolute 4.5 1.4 - 7.0 x10E3/uL   Lymphocytes Absolute 1.8 0.7 - 3.1 x10E3/uL   Monocytes Absolute 0.4 0.1 - 0.9 x10E3/uL   EOS (ABSOLUTE) 0.1 0.0 - 0.4 x10E3/uL   Basophils Absolute 0.0 0.0 - 0.2 x10E3/uL   Immature  Granulocytes 0 Not Estab. %   Immature Grans (Abs) 0.0 0.0 - 0.1 x10E3/uL  Comprehensive metabolic panel  Result Value Ref Range   Glucose 90 65 - 99 mg/dL   BUN 28 (H) 8 - 27 mg/dL   Creatinine, Ser 0.85 0.76 - 1.27 mg/dL   GFR calc non Af Amer 86 >59 mL/min/1.73   GFR calc Af Amer 99 >59 mL/min/1.73   BUN/Creatinine Ratio 33 (H) 10 - 24   Sodium 141 134 - 144 mmol/L   Potassium 4.3 3.5 - 5.2 mmol/L   Chloride 106 96 - 106 mmol/L   CO2 22 20 - 29 mmol/L   Calcium 9.6 8.6 - 10.2 mg/dL   Total Protein 6.9 6.0 - 8.5 g/dL   Albumin 4.5 3.7 - 4.7 g/dL   Globulin, Total 2.4 1.5 - 4.5 g/dL   Albumin/Globulin Ratio 1.9 1.2 - 2.2   Bilirubin Total 0.4 0.0 - 1.2 mg/dL   Alkaline Phosphatase 57 48 - 121 IU/L   AST 28 0 - 40 IU/L   ALT 20 0 - 44 IU/L  Lipid Panel w/o Chol/HDL Ratio  Result Value Ref Range   Cholesterol, Total 145 100 - 199 mg/dL   Triglycerides 52 0 - 149 mg/dL   HDL 65 >39 mg/dL   VLDL Cholesterol Cal 11 5 - 40 mg/dL   LDL Chol Calc (NIH) 69 0 - 99 mg/dL  TSH  Result Value Ref Range   TSH 1.650 0.450 - 4.500 uIU/mL  PSA  Result Value Ref Range   Prostate Specific Ag, Serum 1.5 0.0 - 4.0 ng/mL      Assessment & Plan:   Problem List Items Addressed This Visit       Cardiovascular and Mediastinum   Aortic atherosclerosis ([place]) - Primary    Noted on imaging 03/01/2017, reviewed with patient today and educated. Start Baby ASA 81 MG daily for prevention.       Relevant Orders   Comprehensive metabolic panel   Lipid Panel w/o Chol/HDL Ratio     Genitourinary   BPH (benign prostatic hyperplasia)    Chronic, stable.  Continue current medication regimen and adjust as needed.  Would like PSA checked today and annually, is aware of guideline recommendations and wishes to continue blood checks.  Relevant Medications   finasteride (PROSCAR) 5 MG tablet   Other Relevant Orders   PSA   Other Visit Diagnoses     Encounter for annual physical exam        Annual physical today.  Health maintenance reviewed and Shingrix vaccine sent to pharmacy.   Relevant Orders   CBC with Differential/Platelet   TSH       Discussed aspirin prophylaxis for myocardial infarction prevention and decision was it was not indicated  LABORATORY TESTING:  Health maintenance labs ordered today as discussed above.   The natural history of prostate cancer and ongoing controversy regarding screening and potential treatment outcomes of prostate cancer has been discussed with the patient. The meaning of a false positive PSA and a false negative PSA has been discussed. He indicates understanding of the limitations of this screening test and wishes to proceed with screening PSA testing.   IMMUNIZATIONS:   - Tdap: Tetanus vaccination status reviewed: last tetanus booster within 10 years. - Influenza: refuses - Pneumovax: up to date - Prevnar: up to date - Zostavax vaccine: ordered  SCREENING: - Colonoscopy: Up to date  Discussed with patient purpose of the colonoscopy is to detect colon cancer at curable precancerous or early stages   - AAA Screening: Not applicable  -Hearing Test: Not applicable  -Spirometry: Not applicable   PATIENT COUNSELING:    Sexuality: Discussed sexually transmitted diseases, partner selection, use of condoms, avoidance of unintended pregnancy  and contraceptive alternatives.   Advised to avoid cigarette smoking.  I discussed with the patient that most people either abstain from alcohol or drink within safe limits (<=14/week and <=4 drinks/occasion for males, <=7/weeks and <= 3 drinks/occasion for females) and that the risk for alcohol disorders and other health effects rises proportionally with the number of drinks per week and how often a drinker exceeds daily limits.  Discussed cessation/primary prevention of drug use and availability of treatment for abuse.   Diet: Encouraged to adjust caloric intake to maintain  or achieve ideal  body weight, to reduce intake of dietary saturated fat and total fat, to limit sodium intake by avoiding high sodium foods and not adding table salt, and to maintain adequate dietary potassium and calcium preferably from fresh fruits, vegetables, and low-fat dairy products.    Stressed the importance of regular exercise  Injury prevention: Discussed safety belts, safety helmets, smoke detector, smoking near bedding or upholstery.   Dental health: Discussed importance of regular tooth brushing, flossing, and dental visits.   Follow up plan: NEXT PREVENTATIVE PHYSICAL DUE IN 1 YEAR. Return in about 1 year (around 09/02/2021) for Annual physical.

## 2020-09-02 NOTE — Patient Instructions (Signed)
Benign Prostatic Hyperplasia  Benign prostatic hyperplasia (BPH) is an enlarged prostate gland that is caused by the normal aging process and not by cancer. The prostate is a walnut-sized gland that is involved in the production of semen. It is located in front of the rectum and below the bladder. The bladder stores urine and the urethra is the tube that carries the urine out of the body. The prostate may get bigger asa man gets older. An enlarged prostate can press on the urethra. This can make it harder to pass urine. The build-up of urine in the bladder can cause infection. Back pressure and infection may progress to bladder damage and kidney (renal) failure. What are the causes? This condition is part of a normal aging process. However, not all men develop problems from this condition. If the prostate enlarges away from the urethra, urine flow will not be blocked. If it enlarges toward the urethra andcompresses it, there will be problems passing urine. What increases the risk? This condition is more likely to develop in men over the age of 50 years. What are the signs or symptoms? Symptoms of this condition include: Getting up often during the night to urinate. Needing to urinate frequently during the day. Difficulty starting urine flow. Decrease in size and strength of your urine stream. Leaking (dribbling) after urinating. Inability to pass urine. This needs immediate treatment. Inability to completely empty your bladder. Pain when you pass urine. This is more common if there is also an infection. Urinary tract infection (UTI). How is this diagnosed? This condition is diagnosed based on your medical history, a physical exam, and your symptoms. Tests will also be done, such as: A post-void bladder scan. This measures any amount of urine that may remain in your bladder after you finish urinating. A digital rectal exam. In a rectal exam, your health care provider checks your prostate by  putting a lubricated, gloved finger into your rectum to feel the back of your prostate gland. This exam detects the size of your gland and any abnormal lumps or growths. An exam of your urine (urinalysis). A prostate specific antigen (PSA) screening. This is a blood test used to screen for prostate cancer. An ultrasound. This test uses sound waves to electronically produce a picture of your prostate gland. Your health care provider may refer you to a specialist in kidney and prostate diseases (urologist). How is this treated? Once symptoms begin, your health care provider will monitor your condition (active surveillance or watchful waiting). Treatment for this condition will depend on the severity of your condition. Treatment may include: Observation and yearly exams. This may be the only treatment needed if your condition and symptoms are mild. Medicines to relieve your symptoms, including: Medicines to shrink the prostate. Medicines to relax the muscle of the prostate. Surgery in severe cases. Surgery may include: Prostatectomy. In this procedure, the prostate tissue is removed completely through an open incision or with a laparoscope or robotics. Transurethral resection of the prostate (TURP). In this procedure, a tool is inserted through the opening at the tip of the penis (urethra). It is used to cut away tissue of the inner core of the prostate. The pieces are removed through the same opening of the penis. This removes the blockage. Transurethral incision (TUIP). In this procedure, small cuts are made in the prostate. This lessens the prostate's pressure on the urethra. Transurethral microwave thermotherapy (TUMT). This procedure uses microwaves to create heat. The heat destroys and removes a small   amount of prostate tissue. Transurethral needle ablation (TUNA). This procedure uses radio frequencies to destroy and remove a small amount of prostate tissue. Interstitial laser coagulation (ILC).  This procedure uses a laser to destroy and remove a small amount of prostate tissue. Transurethral electrovaporization (TUVP). This procedure uses electrodes to destroy and remove a small amount of prostate tissue. Prostatic urethral lift. This procedure inserts an implant to push the lobes of the prostate away from the urethra. Follow these instructions at home: Take over-the-counter and prescription medicines only as told by your health care provider. Monitor your symptoms for any changes. Contact your health care provider with any changes. Avoid drinking large amounts of liquid before going to bed or out in public. Avoid or reduce how much caffeine or alcohol you drink. Give yourself time when you urinate. Keep all follow-up visits as told by your health care provider. This is important. Contact a health care provider if: You have unexplained back pain. Your symptoms do not get better with treatment. You develop side effects from the medicine you are taking. Your urine becomes very dark or has a bad smell. Your lower abdomen becomes distended and you have trouble passing your urine. Get help right away if: You have a fever or chills. You suddenly cannot urinate. You feel lightheaded, or very dizzy, or you faint. There are large amounts of blood or clots in the urine. Your urinary problems become hard to manage. You develop moderate to severe low back or flank pain. The flank is the side of your body between the ribs and the hip. These symptoms may represent a serious problem that is an emergency. Do not wait to see if the symptoms will go away. Get medical help right away. Call your local emergency services (911 in the [place] Do not drive yourself to the hospital. Summary Benign prostatic hyperplasia (BPH) is an enlarged prostate that is caused by the normal aging process and not by cancer. An enlarged prostate can press on the urethra. This can make it hard to pass urine. This  condition is part of a normal aging process and is more likely to develop in men over the age of 50 years. Get help right away if you suddenly cannot urinate. This information is not intended to replace advice given to you by your health care provider. Make sure you discuss any questions you have with your healthcare provider. Document Revised: 10/31/2019 Document Reviewed: 10/31/2019 Elsevier Patient Education  2022 [employer]  

## 2020-09-02 NOTE — Assessment & Plan Note (Signed)
Noted on imaging 03/01/2017, reviewed with patient today and educated. Start Baby ASA 81 MG daily for prevention.

## 2020-09-03 LAB — CBC WITH DIFFERENTIAL/PLATELET
Basophils Absolute: 0 10*3/uL (ref 0.0–0.2)
Basos: 0 %
EOS (ABSOLUTE): 0.1 10*3/uL (ref 0.0–0.4)
Eos: 1 %
Hematocrit: 34.3 % — ABNORMAL LOW (ref 37.5–51.0)
Hemoglobin: 11.2 g/dL — ABNORMAL LOW (ref 13.0–17.7)
Immature Grans (Abs): 0 10*3/uL (ref 0.0–0.1)
Immature Granulocytes: 0 %
Lymphocytes Absolute: 1.8 10*3/uL (ref 0.7–3.1)
Lymphs: 27 %
MCH: 30.6 pg (ref 26.6–33.0)
MCHC: 32.7 g/dL (ref 31.5–35.7)
MCV: 94 fL (ref 79–97)
Monocytes Absolute: 0.5 10*3/uL (ref 0.1–0.9)
Monocytes: 7 %
Neutrophils Absolute: 4.4 10*3/uL (ref 1.4–7.0)
Neutrophils: 65 %
Platelets: 188 10*3/uL (ref 150–450)
RBC: 3.66 x10E6/uL — ABNORMAL LOW (ref 4.14–5.80)
RDW: 12.5 % (ref 11.6–15.4)
WBC: 6.8 10*3/uL (ref 3.4–10.8)

## 2020-09-03 LAB — COMPREHENSIVE METABOLIC PANEL
ALT: 16 IU/L (ref 0–44)
AST: 29 IU/L (ref 0–40)
Albumin/Globulin Ratio: 1.8 (ref 1.2–2.2)
Albumin: 4.2 g/dL (ref 3.7–4.7)
Alkaline Phosphatase: 56 IU/L (ref 44–121)
BUN/Creatinine Ratio: 31 — ABNORMAL HIGH (ref 10–24)
BUN: 22 mg/dL (ref 8–27)
Bilirubin Total: 0.3 mg/dL (ref 0.0–1.2)
CO2: 23 mmol/L (ref 20–29)
Calcium: 9.2 mg/dL (ref 8.6–10.2)
Chloride: 106 mmol/L (ref 96–106)
Creatinine, Ser: 0.72 mg/dL — ABNORMAL LOW (ref 0.76–1.27)
Globulin, Total: 2.4 g/dL (ref 1.5–4.5)
Glucose: 90 mg/dL (ref 65–99)
Potassium: 4 mmol/L (ref 3.5–5.2)
Sodium: 140 mmol/L (ref 134–144)
Total Protein: 6.6 g/dL (ref 6.0–8.5)
eGFR: 95 mL/min/{1.73_m2} (ref >59)

## 2020-09-03 LAB — LIPID PANEL W/O CHOL/HDL RATIO
Cholesterol, Total: 143 mg/dL (ref 100–199)
HDL: 62 mg/dL (ref >39)
LDL Chol Calc (NIH): 68 mg/dL (ref 0–99)
Triglycerides: 64 mg/dL (ref 0–149)
VLDL Cholesterol Cal: 13 mg/dL (ref 5–40)

## 2020-09-03 LAB — TSH: TSH: 1.43 u[IU]/mL (ref 0.450–4.500)

## 2020-09-03 LAB — PSA: Prostate Specific Ag, Serum: 1.5 ng/mL (ref 0.0–4.0)

## 2020-09-03 NOTE — Progress Notes (Signed)
Contacted via MyChart   Good morning Jose [name], overall your labs continue to be at baseline and look great.  Continue to have slightly low hemoglobin, most likely need a little more iron in diet.  This is seen a lot in my very active patients.  Any questions? Keep being awesome!!  Thank you for allowing me to participate in your care.  I appreciate you. Kindest regards, Brittni Hult

## 2020-09-10 DIAGNOSIS — X32XXXA Exposure to sunlight, initial encounter: Secondary | ICD-10-CM | POA: Diagnosis not present

## 2020-09-10 DIAGNOSIS — L821 Other seborrheic keratosis: Secondary | ICD-10-CM | POA: Diagnosis not present

## 2020-09-10 DIAGNOSIS — D485 Neoplasm of uncertain behavior of skin: Secondary | ICD-10-CM | POA: Diagnosis not present

## 2020-09-10 DIAGNOSIS — L570 Actinic keratosis: Secondary | ICD-10-CM | POA: Diagnosis not present

## 2020-09-10 DIAGNOSIS — Z85828 Personal history of other malignant neoplasm of skin: Secondary | ICD-10-CM | POA: Diagnosis not present

## 2020-09-10 DIAGNOSIS — D2261 Melanocytic nevi of right upper limb, including shoulder: Secondary | ICD-10-CM | POA: Diagnosis not present

## 2020-09-10 DIAGNOSIS — D2262 Melanocytic nevi of left upper limb, including shoulder: Secondary | ICD-10-CM | POA: Diagnosis not present

## 2020-09-10 DIAGNOSIS — C44519 Basal cell carcinoma of skin of other part of trunk: Secondary | ICD-10-CM | POA: Diagnosis not present

## 2020-09-10 DIAGNOSIS — D2271 Melanocytic nevi of right lower limb, including hip: Secondary | ICD-10-CM | POA: Diagnosis not present

## 2021-01-01 ENCOUNTER — Telehealth: Payer: Self-pay | Admitting: Nurse Practitioner

## 2021-01-01 NOTE — Telephone Encounter (Signed)
Copied from [place] [id number]. Topic: Medicare AWV >> Jan 01, 2021  2:16 PM Lavonia Drafts wrote: Reason for CRM:  Left message for patient to call back and schedule the Medicare Annual Wellness Visit (AWV) virtually or by telephone.  Last AWV 07/16/18  Please schedule at anytime with [facility] Health Advisor.  45 minute appointment  Any questions, please call me at [phone]

## 2021-01-18 ENCOUNTER — Telehealth: Payer: Self-pay | Admitting: Nurse Practitioner

## 2021-01-18 NOTE — Telephone Encounter (Signed)
Copied from [place] [id number]. Topic: Medicare AWV >> Jan 18, 2021  4:21 PM Lavonia Drafts wrote: Reason for CRM:  Left message for patient to call back and schedule the Medicare Annual Wellness Visit (AWV) virtually or by telephone.  Last AWV 07/16/18  Please schedule at anytime with [facility]Nurse Health Advisor.  45 minute appointment  Any questions, please call me at [phone]

## 2021-03-17 ENCOUNTER — Telehealth: Payer: Self-pay | Admitting: Nurse Practitioner

## 2021-03-17 NOTE — Telephone Encounter (Signed)
Copied from [place] [id number]. Topic: Medicare AWV >> Mar 17, 2021  3:43 PM Lavonia Drafts wrote: Reason for CRM: N/A unable to leave a message for patient to call back and schedule the Medicare Annual Wellness Visit (AWV) virtually or by telephone.  Last AWV 07/16/18  Please schedule at anytime with [facility]Nurse Health Advisor.  45 minute appointment  Any questions, please call me at [phone]

## 2021-04-12 DIAGNOSIS — Z03818 Encounter for observation for suspected exposure to other biological agents ruled out: Secondary | ICD-10-CM | POA: Diagnosis not present

## 2021-04-12 DIAGNOSIS — J209 Acute bronchitis, unspecified: Secondary | ICD-10-CM | POA: Diagnosis not present

## 2021-04-12 DIAGNOSIS — U071 COVID-19: Secondary | ICD-10-CM | POA: Diagnosis not present

## 2021-04-12 DIAGNOSIS — B9689 Other specified bacterial agents as the cause of diseases classified elsewhere: Secondary | ICD-10-CM | POA: Diagnosis not present

## 2021-04-12 DIAGNOSIS — J019 Acute sinusitis, unspecified: Secondary | ICD-10-CM | POA: Diagnosis not present

## 2021-05-10 ENCOUNTER — Ambulatory Visit (INDEPENDENT_AMBULATORY_CARE_PROVIDER_SITE_OTHER): Payer: PPO | Admitting: *Deleted

## 2021-05-10 DIAGNOSIS — Z0000 Encounter for general adult medical examination without abnormal findings: Secondary | ICD-10-CM

## 2021-05-10 NOTE — Patient Instructions (Signed)
Jose [name] , Thank you for taking time to come for your Medicare Wellness Visit. I appreciate your ongoing commitment to your health goals. Please review the following plan we discussed and let me know if I can assist you in the future.   Screening recommendations/referrals: Colonoscopy: up to date Recommended yearly ophthalmology/optometry visit for glaucoma screening and checkup Recommended yearly dental visit for hygiene and checkup  Vaccinations: Influenza vaccine: n/a Pneumococcal vaccine: up to date Tdap vaccine: up to date Shingles vaccine: up to date    Advanced directives: yes not on file  Conditions/risks identified:   Next appointment: 09-10-2021 @ 3:00 Cannady  Preventive Care 40 Years and Older, Male Preventive care refers to lifestyle choices and visits with your health care provider that can promote health and wellness. What does preventive care include? A yearly physical exam. This is also called an annual well check. Dental exams once or twice a year. Routine eye exams. Ask your health care provider how often you should have your eyes checked. Personal lifestyle choices, including: Daily care of your teeth and gums. Regular physical activity. Eating a healthy diet. Avoiding tobacco and drug use. Limiting alcohol use. Practicing safe sex. Taking low doses of aspirin every day. Taking vitamin and mineral supplements as recommended by your health care provider. What happens during an annual well check? The services and screenings done by your health care provider during your annual well check will depend on your age, overall health, lifestyle risk factors, and family history of disease. Counseling  Your health care provider may ask you questions about your: Alcohol use. Tobacco use. Drug use. Emotional well-being. Home and relationship well-being. Sexual activity. Eating habits. History of falls. Memory and ability to understand (cognition). Work and work  Statistician. Screening  You may have the following tests or measurements: Height, weight, and BMI. Blood pressure. Lipid and cholesterol levels. These may be checked every 5 years, or more frequently if you are over 65 years old. Skin check. Lung cancer screening. You may have this screening every year starting at age 46 if you have a 30-pack-year history of smoking and currently smoke or have quit within the past 15 years. Fecal occult blood test (FOBT) of the stool. You may have this test every year starting at age 55. Flexible sigmoidoscopy or colonoscopy. You may have a sigmoidoscopy every 5 years or a colonoscopy every 10 years starting at age 62. Prostate cancer screening. Recommendations will vary depending on your family history and other risks. Hepatitis C blood test. Hepatitis B blood test. Sexually transmitted disease (STD) testing. Diabetes screening. This is done by checking your blood sugar (glucose) after you have not eaten for a while (fasting). You may have this done every 1-3 years. Abdominal aortic aneurysm (AAA) screening. You may need this if you are a current or former smoker. Osteoporosis. You may be screened starting at age 54 if you are at high risk. Talk with your health care provider about your test results, treatment options, and if necessary, the need for more tests. Vaccines  Your health care provider may recommend certain vaccines, such as: Influenza vaccine. This is recommended every year. Tetanus, diphtheria, and acellular pertussis (Tdap, Td) vaccine. You may need a Td booster every 10 years. Zoster vaccine. You may need this after age 52. Pneumococcal 13-valent conjugate (PCV13) vaccine. One dose is recommended after age 50. Pneumococcal polysaccharide (PPSV23) vaccine. One dose is recommended after age 56. Talk to your health care provider about which screenings and  vaccines you need and how often you need them. This information is not intended to replace  advice given to you by your health care provider. Make sure you discuss any questions you have with your health care provider. Document Released: 03/20/2015 Document Revised: 11/11/2015 Document Reviewed: 12/23/2014 Elsevier Interactive Patient Education  2017 [place] Prevention in the Home Falls can cause injuries. They can happen to people of all ages. There are many things you can do to make your home safe and to help prevent falls. What can I do on the outside of my home? Regularly fix the edges of walkways and driveways and fix any cracks. Remove anything that might make you trip as you walk through a door, such as a raised step or threshold. Trim any bushes or trees on the path to your home. Use bright outdoor lighting. Clear any walking paths of anything that might make someone trip, such as rocks or tools. Regularly check to see if handrails are loose or broken. Make sure that both sides of any steps have handrails. Any raised decks and porches should have guardrails on the edges. Have any leaves, snow, or ice cleared regularly. Use sand or salt on walking paths during winter. Clean up any spills in your garage right away. This includes oil or grease spills. What can I do in the bathroom? Use night lights. Install grab bars by the toilet and in the tub and shower. Do not use towel bars as grab bars. Use non-skid mats or decals in the tub or shower. If you need to sit down in the shower, use a plastic, non-slip stool. Keep the floor dry. Clean up any water that spills on the floor as soon as it happens. Remove soap buildup in the tub or shower regularly. Attach bath mats securely with double-sided non-slip rug tape. Do not have throw rugs and other things on the floor that can make you trip. What can I do in the bedroom? Use night lights. Make sure that you have a light by your bed that is easy to reach. Do not use any sheets or blankets that are too big for your bed.  They should not hang down onto the floor. Have a firm chair that has side arms. You can use this for support while you get dressed. Do not have throw rugs and other things on the floor that can make you trip. What can I do in the kitchen? Clean up any spills right away. Avoid walking on wet floors. Keep items that you use a lot in easy-to-reach places. If you need to reach something above you, use a strong step stool that has a grab bar. Keep electrical cords out of the way. Do not use floor polish or wax that makes floors slippery. If you must use wax, use non-skid floor wax. Do not have throw rugs and other things on the floor that can make you trip. What can I do with my stairs? Do not leave any items on the stairs. Make sure that there are handrails on both sides of the stairs and use them. Fix handrails that are broken or loose. Make sure that handrails are as long as the stairways. Check any carpeting to make sure that it is firmly attached to the stairs. Fix any carpet that is loose or worn. Avoid having throw rugs at the top or bottom of the stairs. If you do have throw rugs, attach them to the floor with carpet tape. Make  sure that you have a light switch at the top of the stairs and the bottom of the stairs. If you do not have them, ask someone to add them for you. What else can I do to help prevent falls? Wear shoes that: Do not have high heels. Have rubber bottoms. Are comfortable and fit you well. Are closed at the toe. Do not wear sandals. If you use a stepladder: Make sure that it is fully opened. Do not climb a closed stepladder. Make sure that both sides of the stepladder are locked into place. Ask someone to hold it for you, if possible. Clearly mark and make sure that you can see: Any grab bars or handrails. First and last steps. Where the edge of each step is. Use tools that help you move around (mobility aids) if they are needed. These  include: Canes. Walkers. Scooters. Crutches. Turn on the lights when you go into a dark area. Replace any light bulbs as soon as they burn out. Set up your furniture so you have a clear path. Avoid moving your furniture around. If any of your floors are uneven, fix them. If there are any pets around you, be aware of where they are. Review your medicines with your doctor. Some medicines can make you feel dizzy. This can increase your chance of falling. Ask your doctor what other things that you can do to help prevent falls. This information is not intended to replace advice given to you by your health care provider. Make sure you discuss any questions you have with your health care provider. Document Released: 12/18/2008 Document Revised: 07/30/2015 Document Reviewed: 03/28/2014 Elsevier Interactive Patient Education  2017 [employer].

## 2021-05-10 NOTE — Progress Notes (Signed)
Subjective:   Jose [name] is a 76 y.o. male who presents for Medicare Annual/Subsequent preventive examination.  I connected with  Jose [name] on 05/10/21 by a telephone enabled telemedicine application and verified that I am speaking with the correct person using two identifiers.   I discussed the limitations of evaluation and management by telemedicine. The patient expressed understanding and agreed to proceed.  Patient location: home  Provider location:   Tele-Health not in clinic    Review of Systems     Cardiac Risk Factors include: advanced age (>39mn, >>55women);hypertension;male gender     Objective:    Today's Vitals   There is no height or weight on file to calculate BMI.  Advanced Directives 05/10/2021 07/16/2018 12/29/2017 12/27/2017 03/03/2017 02/28/2017  Does Patient Have a Medical Advance Directive? Yes No No Yes Yes Yes  Type of AParamedicof [place]Living will Living will;Healthcare Power of [place] will  Does patient want to make changes to medical advance directive? - - No - Patient declined - - No - Patient declined  Copy of HOildalein Chart? No - copy requested No - copy requested - - - No - copy requested  Would patient like information on creating a medical advance directive? No - Patient declined - No - Patient declined - - -    Current Medications (verified) Outpatient Encounter Medications as of 05/10/2021  Medication Sig   calcium-vitamin D (OSCAL WITH D) 500-200 MG-UNIT tablet Take 1 tablet by mouth.   Multiple Vitamin (MULTIVITAMIN) tablet Take 1 tablet by mouth daily.   docusate sodium (COLACE) 100 MG capsule Take 200 mg by mouth at bedtime. (Patient not taking: Reported on 05/10/2021)   finasteride (PROSCAR) 5 MG tablet Take 1 tablet (5 mg total) by mouth every morning.   meloxicam (MOBIC) 7.5 MG tablet meloxicam 7.5 mg tablet  TAKE ONE TABLET BY MOUTH  EVERY DAY   MODERNA COVID-19 VACCINE 100 MCG/0.5ML injection  (Patient not taking: Reported on 05/10/2021)   No facility-administered encounter medications on file as of 05/10/2021.    Allergies (verified) Patient has no known allergies.   History: Past Medical History:  Diagnosis Date   Asthma    as a child   Basal cell carcinoma (BCC) of scalp 08/2015   Collapsed lung 02/2017   Family history of adverse reaction to anesthesia    brother-PT UNSURE BUT WILL FIND OUT REACTION   GERD (gastroesophageal reflux disease)    occ   Osteoporosis    Past Surgical History:  Procedure Laterality Date   broken collar bone  2002/2007   FRACTURE SURGERY  2007   broken left thigh bone   FRACTURE SURGERY  2007   broken left upper arm   HERNIA REPAIR     LEG SURGERY     ORIF CALCANEOUS FRACTURE Right 12/29/2017   Procedure: OPEN REDUCTION INTERNAL FIXATION (ORIF) CALCANEOUS FRACTURE;  Surgeon: FSamara [name] DPM;  Location: [facility] ORS;  Service: Podiatry;  Laterality: Right;   Family History  Problem Relation Age of Onset   Congestive Heart Failure Mother    Obesity Brother    Cancer Paternal Grandfather        leukemia   Social History   Socioeconomic History   Marital status: Widowed    Spouse name: Not on file   Number of children: Not on file   Years of education: 2 associate degreee  Highest education level: Associate degree: academic program  Occupational History   Occupation: retired   Tobacco Use   Smoking status: Former    Years: 4.00    Types: Cigarettes    Quit date: 02/10/1969    Years since quitting: 52.2   Smokeless tobacco: Never  Vaping Use   Vaping Use: Never used  Substance and Sexual Activity   Alcohol use: No   Drug use: No   Sexual activity: Not on file  Other Topics Concern   Not on file  Social History Narrative   Not on file   Social Determinants of Health   Financial Resource Strain: Low Risk    Difficulty of Paying Living Expenses: Not hard  at all  Food Insecurity: No Food Insecurity   Worried About Charity fundraiser in the Last Year: Never true   [place] in the Last Year: Never true  Transportation Needs: No Transportation Needs   Lack of Transportation (Medical): No   Lack of Transportation (Non-Medical): No  Physical Activity: Sufficiently Active   Days of Exercise per Week: 6 days   Minutes of Exercise per Session: 70 min  Stress: No Stress Concern Present   Feeling of Stress : Not at all  Social Connections: Moderately Isolated   Frequency of Communication with Friends and Family: More than three times a week   Frequency of Social Gatherings with Friends and Family: More than three times a week   Attends Religious Services: Never   Marine scientist or Organizations: Yes   Attends Music therapist: More than 4 times per year   Marital Status: Widowed    Tobacco Counseling Counseling given: Not Answered   Clinical Intake:  Pre-visit preparation completed: Yes  Pain : No/denies pain     Nutritional Risks: None Diabetes: No  How often do you need to have someone help you when you read instructions, pamphlets, or other written materials from your doctor or pharmacy?: 1 - Never  Diabetic?  no  Interpreter Needed?: No  Information entered by :: Jose Kennedy LPN   Activities of Daily Living In your present state of health, do you have any difficulty performing the following activities: 05/10/2021 09/02/2020  Hearing? N N  Vision? N N  Difficulty concentrating or making decisions? N N  Walking or climbing stairs? N N  Dressing or bathing? N N  Doing errands, shopping? N N  Preparing Food and eating ? N -  Using the Toilet? N -  In the past six months, have you accidently leaked urine? N -  Do you have problems with loss of bowel control? N -  Managing your Medications? N -  Managing your Finances? N -  Housekeeping or managing your Housekeeping? N -  Some recent data might  be hidden    Patient Care Team: Jose Lick, NP as PCP - General (Nurse Practitioner) Jose Morelle, PA-C as Physician Assistant (Physician Assistant) Jose [name], DPM as Referring Physician (Podiatry)  Indicate any recent Medical Services you may have received from other than [facility] providers in the past year (date may be approximate).     Assessment:   This is a routine wellness examination for [name].  Hearing/Vision screen Hearing Screening - Comments:: No trouble hearing Vision Screening - Comments:: Not up to date   Dietary issues and exercise activities discussed: Current Exercise Habits: Home exercise routine, Time (Minutes): 60, Frequency (Times/Week): 5, Weekly Exercise (Minutes/Week): 300, Intensity:  Intense   Goals Addressed             This Visit's Progress    DIET - INCREASE WATER INTAKE   Not on track    Recommend drinking at least 6-8 glasses of water a day.      Patient Stated       Continue current lifestyle as is       Depression Screen PHQ 2/9 Scores 05/10/2021 09/02/2020 08/30/2019 08/28/2018 07/16/2018 07/05/2017 07/04/2016  PHQ - 2 Score 0 0 0 0 0 0 0  PHQ- 9 Score - - - 0 - - -    Fall Risk Fall Risk  05/10/2021 09/02/2020 08/30/2019 08/28/2018 07/16/2018  Falls in the past year? 0 0 0 1 0  Number falls in past yr: 0 0 0 0 -  Injury with Fall? 0 0 0 1 -  Risk for fall due to : - No Fall Risks - - -  Follow up Falls evaluation completed;Falls prevention discussed Falls evaluation completed Falls evaluation completed Falls evaluation completed -    FALL RISK PREVENTION PERTAINING TO THE HOME:  Any stairs in or around the home? No  If so, are there any without handrails? No  Home free of loose throw rugs in walkways, pet beds, electrical cords, etc? Yes  Adequate lighting in your home to reduce risk of falls? Yes   ASSISTIVE DEVICES UTILIZED TO PREVENT FALLS:  Life alert? No  Use of a cane, walker or w/c? No  Grab bars in the  bathroom? No  Shower chair or bench in shower? No  Elevated toilet seat or a handicapped toilet? No   TIMED UP AND GO:  Was the test performed? No .    Cognitive Function:  Normal cognitive status assessed by direct observation by this Nurse Health Advisor. No abnormalities found.      6CIT Screen 07/16/2018  What Year? 0 points  What month? 0 points  What time? 0 points  Count back from 20 0 points  Months in reverse 0 points  Repeat phrase 0 points  Total Score 0    Immunizations Immunization History  Administered Date(s) Administered   Moderna Sars-Covid-2 Vaccination 05/02/2019, 05/30/2019, 07/24/2020   Pneumococcal Conjugate-13 07/21/2014, 01/22/2020   Pneumococcal Polysaccharide-23 06/29/2010   Tdap 06/29/2011, 03/07/2013   Zoster Recombinat (Shingrix) 10/17/2019, 01/23/2020   Zoster, Live 07/16/2013    TDAP status: Up to date  Flu Vaccine status: Declined, Education has been provided regarding the importance of this vaccine but patient still declined. Advised may receive this vaccine at local pharmacy or Health Dept. Aware to provide a copy of the vaccination record if obtained from local pharmacy or Health Dept. Verbalized acceptance and understanding.  Pneumococcal vaccine status: Up to date  Covid-19 vaccine status: Information provided on how to obtain vaccines.   Qualifies for Shingles Vaccine? No   Zostavax completed Yes   Shingrix Completed?: Yes  Screening Tests Health Maintenance  Topic Date Due   COVID-19 Vaccine (4 - Booster for Moderna series) 09/18/2020   INFLUENZA VACCINE  06/04/2021 (Originally 10/05/2020)   TETANUS/TDAP  03/08/2023   Pneumonia Vaccine 26+ Years old  Completed   Hepatitis C Screening  Completed   Zoster Vaccines- Shingrix  Completed   HPV VACCINES  Aged Out   COLONOSCOPY (Pts 45-6yr Insurance coverage will need to be confirmed)  Discontinued    Health Maintenance  Health Maintenance Due  Topic Date Due   COVID-19  Vaccine (4 - Booster for  Moderna series) 09/18/2020    Colorectal cancer screening: Type of screening: Colonoscopy. Completed  . Repeat every   years  Lung Cancer Screening: (Low Dose CT Chest recommended if Age 38-80 years, 30 pack-year currently smoking OR have quit w/in 15years.) does not qualify.   Lung Cancer Screening Referral:   Additional Screening:  Hepatitis C Screening: does not qualify; Completed 2018  Vision Screening: Recommended annual ophthalmology exams for early detection of glaucoma and other disorders of the eye. Is the patient up to date with their annual eye exam?  No  Who is the provider or what is the name of the office in which the patient attends annual eye exams?  If pt is not established with a provider, would they like to be referred to a provider to establish care? No .   Dental Screening: Recommended annual dental exams for proper oral hygiene  Community Resource Referral / Chronic Care Management: CRR required this visit?  No   CCM required this visit?  No      Plan:     I have personally reviewed and noted the following in the patients chart:   Medical and social history Use of alcohol, tobacco or illicit drugs  Current medications and supplements including opioid prescriptions. Patient is not currently taking opioid prescriptions. Functional ability and status Nutritional status Physical activity Advanced directives List of other physicians Hospitalizations, surgeries, and ER visits in previous 12 months Vitals Screenings to include cognitive, depression, and falls Referrals and appointments  In addition, I have reviewed and discussed with patient certain preventive protocols, quality metrics, and best practice recommendations. A written personalized care plan for preventive services as well as general preventive health recommendations were provided to patient.     Jose Kennedy, LPN   06/06/7060   Nurse Notes:

## 2021-06-03 DIAGNOSIS — C44712 Basal cell carcinoma of skin of right lower limb, including hip: Secondary | ICD-10-CM | POA: Diagnosis not present

## 2021-06-03 DIAGNOSIS — D2272 Melanocytic nevi of left lower limb, including hip: Secondary | ICD-10-CM | POA: Diagnosis not present

## 2021-06-03 DIAGNOSIS — D485 Neoplasm of uncertain behavior of skin: Secondary | ICD-10-CM | POA: Diagnosis not present

## 2021-06-03 DIAGNOSIS — Z85828 Personal history of other malignant neoplasm of skin: Secondary | ICD-10-CM | POA: Diagnosis not present

## 2021-06-03 DIAGNOSIS — D2261 Melanocytic nevi of right upper limb, including shoulder: Secondary | ICD-10-CM | POA: Diagnosis not present

## 2021-06-03 DIAGNOSIS — X32XXXA Exposure to sunlight, initial encounter: Secondary | ICD-10-CM | POA: Diagnosis not present

## 2021-06-03 DIAGNOSIS — D2262 Melanocytic nevi of left upper limb, including shoulder: Secondary | ICD-10-CM | POA: Diagnosis not present

## 2021-06-03 DIAGNOSIS — L570 Actinic keratosis: Secondary | ICD-10-CM | POA: Diagnosis not present

## 2021-08-03 DIAGNOSIS — C44712 Basal cell carcinoma of skin of right lower limb, including hip: Secondary | ICD-10-CM | POA: Diagnosis not present

## 2021-08-03 DIAGNOSIS — C44519 Basal cell carcinoma of skin of other part of trunk: Secondary | ICD-10-CM | POA: Diagnosis not present

## 2021-08-26 ENCOUNTER — Other Ambulatory Visit: Payer: Self-pay | Admitting: Nurse Practitioner

## 2021-08-26 NOTE — Telephone Encounter (Signed)
Requested Prescriptions  Pending Prescriptions Disp Refills  . finasteride (PROSCAR) 5 MG tablet [Pharmacy Med Name: FINASTERIDE 5 MG TAB] 90 tablet 0    Sig: TAKE 1 TABLET BY MOUTH EVERY MORNING     Urology: 5-alpha Reductase Inhibitors Passed - 08/26/2021 10:22 AM      Passed - PSA in normal range and within 360 days    Prostate Specific Ag, Serum  Date Value Ref Range Status  09/02/2020 1.5 0.0 - 4.0 ng/mL Final    Comment:    Roche ECLIA methodology. According to the American Urological Association, Serum PSA should decrease and remain at undetectable levels after radical prostatectomy. The AUA defines biochemical recurrence as an initial PSA value 0.2 ng/mL or greater followed by a subsequent confirmatory PSA value 0.2 ng/mL or greater. Values obtained with different assay methods or kits cannot be used interchangeably. Results cannot be interpreted as absolute evidence of the presence or absence of malignant disease.          Passed - Valid encounter within last 12 months    Recent Outpatient Visits          11 months ago Aortic atherosclerosis ([place])   [place], Henrine Screws T, NP   1 year ago Physical exam, annual   [place], Barbaraann Faster, NP   2 years ago Encounter for annual physical exam   [place], [place] T, NP   4 years ago Iron deficiency anemia, unspecified iron deficiency anemia type   [facility] [place], Jeannette How, MD   5 years ago PE (physical exam), annual   [place], Jeannette How, MD      Future Appointments            In 2 weeks [place], Barbaraann Faster, NP [employer], [facility]

## 2021-09-05 NOTE — Patient Instructions (Signed)

## 2021-09-10 ENCOUNTER — Ambulatory Visit (INDEPENDENT_AMBULATORY_CARE_PROVIDER_SITE_OTHER): Payer: PPO | Admitting: Nurse Practitioner

## 2021-09-10 ENCOUNTER — Encounter: Payer: Self-pay | Admitting: Nurse Practitioner

## 2021-09-10 VITALS — BP 125/75 | HR 62 | Temp 97.6°F | Ht 65.75 in | Wt 137.6 lb

## 2021-09-10 DIAGNOSIS — N401 Enlarged prostate with lower urinary tract symptoms: Secondary | ICD-10-CM

## 2021-09-10 DIAGNOSIS — Z0000 Encounter for general adult medical examination without abnormal findings: Secondary | ICD-10-CM

## 2021-09-10 DIAGNOSIS — Z136 Encounter for screening for cardiovascular disorders: Secondary | ICD-10-CM

## 2021-09-10 DIAGNOSIS — Z13220 Encounter for screening for lipoid disorders: Secondary | ICD-10-CM

## 2021-09-10 DIAGNOSIS — I700 Atherosclerosis of aorta: Secondary | ICD-10-CM | POA: Diagnosis not present

## 2021-09-10 DIAGNOSIS — R351 Nocturia: Secondary | ICD-10-CM

## 2021-09-10 MED ORDER — FINASTERIDE 5 MG PO TABS: 5.0000 mg | ORAL_TABLET | Freq: Every morning | ORAL | 4 refills | Status: DC

## 2021-09-10 NOTE — Progress Notes (Signed)
BP 125/75   Pulse 62   Temp 97.6 F (36.4 C) (Oral)   Ht 5' 5.75" (1.67 m)   Wt 137 lb 9.6 oz (62.4 kg)   SpO2 99%   BMI 22.38 kg/m    Subjective:    Patient ID: Jose [name], male    DOB: 1945-05-01, 76 y.o.   MRN: [id number]  HPI: Jose [name] is a 76 y.o. male presenting on 09/10/2021 for comprehensive medical examination. Current medical complaints include:none  He currently lives with: wife Interim Problems from his last visit: no   Continues to ride bike daily, long distances.  BPH Continues on Finasteride 5 MG at bedtime.  Aortic atherosclerosis noted on past imaging. BPH status: controlled Satisfied with current treatment?: yes Medication side effects: no Medication compliance: good compliance Duration: chronic Nocturia: 1/night Urinary frequency:no Incomplete voiding: no Urgency: no Weak urinary stream: no Straining to start stream: no Dysuria: no Onset: gradual Severity: mild The 10-year ASCVD risk score (Arnett DK, et al., 2019) is: 21.7%   Values used to calculate the score:     Age: 64 years     Sex: Male     Is Non-Hispanic African American: No     Diabetic: No     Tobacco smoker: No     Systolic Blood Pressure: 789 mmHg     Is BP treated: No     HDL Cholesterol: 62 mg/dL     Total Cholesterol: 143 mg/dL  Functional Status Survey: Is the patient deaf or have difficulty hearing?: No Does the patient have difficulty seeing, even when wearing glasses/contacts?: No Does the patient have difficulty concentrating, remembering, or making decisions?: No Does the patient have difficulty walking or climbing stairs?: No Does the patient have difficulty dressing or bathing?: No Does the patient have difficulty doing errands alone such as visiting a doctor's office or shopping?: No  FALL RISK:    09/10/2021    2:52 PM 05/10/2021    9:53 AM 09/02/2020    2:57 PM 08/30/2019    2:47 PM 08/28/2018    8:53 AM  [place] in the past year? 0 0 0 0  1  Number falls in past yr: 0 0 0 0 0  Injury with Fall? 0 0 0 0 1  Risk for fall due to : No Fall Risks  No Fall Risks    Follow up Falls evaluation completed Falls evaluation completed;Falls prevention discussed Falls evaluation completed Falls evaluation completed Falls evaluation completed    Depression Screen    09/10/2021    2:52 PM 05/10/2021   10:05 AM 09/02/2020    2:58 PM 08/30/2019    2:47 PM 08/28/2018    8:53 AM  Depression screen PHQ 2/9  Decreased Interest 0 0 0 0 0  Down, Depressed, Hopeless 0 0 0 0 0  PHQ - 2 Score 0 0 0 0 0  Altered sleeping 0    0  Tired, decreased energy 0    0  Change in appetite 0    0  Feeling bad or failure about yourself  0    0  Trouble concentrating 0    0  Moving slowly or fidgety/restless 0    0  Suicidal thoughts 0    0  PHQ-9 Score 0    0  Difficult doing work/chores Not difficult at all    Not difficult at all   Advanced Directives <no information>  Past Medical History:  Past Medical History:  Diagnosis Date   Asthma    as a child   Basal cell carcinoma (BCC) of scalp 08/2015   Collapsed lung 02/2017   Family history of adverse reaction to anesthesia    brother-PT UNSURE BUT WILL FIND OUT REACTION   GERD (gastroesophageal reflux disease)    occ   Osteoporosis     Surgical History:  Past Surgical History:  Procedure Laterality Date   broken collar bone  2002/2007   FRACTURE SURGERY  2007   broken left thigh bone   FRACTURE SURGERY  2007   broken left upper arm   HERNIA REPAIR     LEG SURGERY     ORIF CALCANEOUS FRACTURE Right 12/29/2017   Procedure: OPEN REDUCTION INTERNAL FIXATION (ORIF) CALCANEOUS FRACTURE;  Surgeon: Samara Deist, DPM;  Location: [facility] ORS;  Service: Podiatry;  Laterality: Right;    Medications:  Current Outpatient Medications on File Prior to Visit  Medication Sig   aspirin 81 MG chewable tablet Chew 81 mg by mouth daily.   calcium-vitamin D (OSCAL WITH D) 500-200 MG-UNIT tablet Take 1  tablet by mouth.   Multiple Vitamin (MULTIVITAMIN) tablet Take 1 tablet by mouth daily.   No current facility-administered medications on file prior to visit.    Allergies:  No Known Allergies  Social History:  Social History   Socioeconomic History   Marital status: Widowed    Spouse name: Not on file   Number of children: Not on file   Years of education: 2 associate degreee    Highest education level: Associate degree: academic program  Occupational History   Occupation: retired   Tobacco Use   Smoking status: Former    Years: 4.00    Types: Cigarettes    Quit date: 02/10/1969    Years since quitting: 52.6   Smokeless tobacco: Never  Vaping Use   Vaping Use: Never used  Substance and Sexual Activity   Alcohol use: No   Drug use: No   Sexual activity: Not on file  Other Topics Concern   Not on file  Social History Narrative   Not on file   Social Determinants of Health   Financial Resource Strain: Low Risk  (05/10/2021)   Overall Financial Resource Strain (CARDIA)    Difficulty of Paying Living Expenses: Not hard at all  Food Insecurity: No Food Insecurity (05/10/2021)   Hunger Vital Sign    Worried About Running Out of Food in the Last Year: Never true    [place] in the Last Year: Never true  Transportation Needs: No Transportation Needs (05/10/2021)   PRAPARE - Hydrologist (Medical): No    Lack of Transportation (Non-Medical): No  Physical Activity: Sufficiently Active (05/10/2021)   Exercise Vital Sign    Days of Exercise per Week: 6 days    Minutes of Exercise per Session: 70 min  Stress: No Stress Concern Present (05/10/2021)   [place]    Feeling of Stress : Not at all  Social Connections: Moderately Isolated (05/10/2021)   Social Connection and Isolation Panel [NHANES]    Frequency of Communication with Friends and Family: More than three times a week     Frequency of Social Gatherings with Friends and Family: More than three times a week    Attends Religious Services: Never    Marine scientist or Organizations: Yes    Attends Club or  Organization Meetings: More than 4 times per year    Marital Status: Widowed  Intimate Partner Violence: Not At Risk (05/10/2021)   Humiliation, Afraid, Rape, and Kick questionnaire    Fear of Current or Ex-Partner: No    Emotionally Abused: No    Physically Abused: No    Sexually Abused: No   Social History   Tobacco Use  Smoking Status Former   Years: 4.00   Types: Cigarettes   Quit date: 02/10/1969   Years since quitting: 52.6  Smokeless Tobacco Never   Social History   Substance and Sexual Activity  Alcohol Use No    Family History:  Family History  Problem Relation Age of Onset   Congestive Heart Failure Mother    Obesity Brother    Cancer Paternal Grandfather        leukemia    Past medical history, surgical history, medications, allergies, family history and social history reviewed with patient today and changes made to appropriate areas of the chart.   Review of Systems - negative All other ROS negative except what is listed above and in the HPI.      Objective:    BP 125/75   Pulse 62   Temp 97.6 F (36.4 C) (Oral)   Ht 5' 5.75" (1.67 m)   Wt 137 lb 9.6 oz (62.4 kg)   SpO2 99%   BMI 22.38 kg/m   Wt Readings from Last 3 Encounters:  09/10/21 137 lb 9.6 oz (62.4 kg)  09/02/20 135 lb 9.6 oz (61.5 kg)  08/30/19 137 lb 9.6 oz (62.4 kg)    Physical Exam Vitals and nursing note reviewed.  Constitutional:      General: He is awake. He is not in acute distress.    Appearance: He is well-developed and well-groomed. He is not ill-appearing.  HENT:     Head: Normocephalic and atraumatic.     Right Ear: Hearing, tympanic membrane, ear canal and external ear normal. No drainage.     Left Ear: Hearing, tympanic membrane, ear canal and external ear normal. No drainage.      Nose: Nose normal.     Mouth/Throat:     Pharynx: Uvula midline.  Eyes:     General: Lids are normal.        Right eye: No discharge.        Left eye: No discharge.     Extraocular Movements: Extraocular movements intact.     Conjunctiva/sclera: Conjunctivae normal.     Pupils: Pupils are equal, round, and reactive to light.     Visual Fields: Right eye visual fields normal and left eye visual fields normal.  Neck:     Thyroid: No thyromegaly.     Vascular: No carotid bruit or JVD.     Trachea: Trachea normal.  Cardiovascular:     Rate and Rhythm: Normal rate and regular rhythm.     Heart sounds: Normal heart sounds, S1 normal and S2 normal. No murmur heard.    No gallop.  Pulmonary:     Effort: Pulmonary effort is normal. No accessory muscle usage or respiratory distress.     Breath sounds: Normal breath sounds.  Abdominal:     General: Bowel sounds are normal.     Palpations: Abdomen is soft. There is no hepatomegaly or splenomegaly.     Tenderness: There is no abdominal tenderness.  Genitourinary:    Penis: Normal.      Testes: Normal.     Prostate: Normal.  Rectum: Normal.     Comments: Refused chaperone Musculoskeletal:        General: Normal range of motion.     Cervical back: Normal range of motion and neck supple.     Right lower leg: No edema.     Left lower leg: No edema.  Lymphadenopathy:     Head:     Right side of head: No submental, submandibular, tonsillar, preauricular or posterior auricular adenopathy.     Left side of head: No submental, submandibular, tonsillar, preauricular or posterior auricular adenopathy.     Cervical: No cervical adenopathy.  Skin:    General: Skin is warm and dry.     Capillary Refill: Capillary refill takes less than 2 seconds.     Findings: No rash.  Neurological:     Mental Status: He is alert and oriented to person, place, and time.     Gait: Gait is intact.     Deep Tendon Reflexes: Reflexes are normal and symmetric.      Reflex Scores:      Brachioradialis reflexes are 2+ on the right side and 2+ on the left side.      Patellar reflexes are 2+ on the right side and 2+ on the left side. Psychiatric:        Attention and Perception: Attention normal.        Mood and Affect: Mood normal.        Speech: Speech normal.        Behavior: Behavior normal. Behavior is cooperative.        Thought Content: Thought content normal.        Cognition and Memory: Cognition normal.        Judgment: Judgment normal.    Results for orders placed or performed in visit on 09/02/20  CBC with Differential/Platelet  Result Value Ref Range   WBC 6.8 3.4 - 10.8 x10E3/uL   RBC 3.66 (L) 4.14 - 5.80 x10E6/uL   Hemoglobin 11.2 (L) 13.0 - 17.7 g/dL   Hematocrit 34.3 (L) 37.5 - 51.0 %   MCV 94 79 - 97 fL   MCH 30.6 26.6 - 33.0 pg   MCHC 32.7 31.5 - 35.7 g/dL   RDW 12.5 11.6 - 15.4 %   Platelets 188 150 - 450 x10E3/uL   Neutrophils 65 Not Estab. %   Lymphs 27 Not Estab. %   Monocytes 7 Not Estab. %   Eos 1 Not Estab. %   Basos 0 Not Estab. %   Neutrophils Absolute 4.4 1.4 - 7.0 x10E3/uL   Lymphocytes Absolute 1.8 0.7 - 3.1 x10E3/uL   Monocytes Absolute 0.5 0.1 - 0.9 x10E3/uL   EOS (ABSOLUTE) 0.1 0.0 - 0.4 x10E3/uL   Basophils Absolute 0.0 0.0 - 0.2 x10E3/uL   Immature Granulocytes 0 Not Estab. %   Immature Grans (Abs) 0.0 0.0 - 0.1 x10E3/uL  Comprehensive metabolic panel  Result Value Ref Range   Glucose 90 65 - 99 mg/dL   BUN 22 8 - 27 mg/dL   Creatinine, Ser 0.72 (L) 0.76 - 1.27 mg/dL   eGFR 95 >59 mL/min/1.73   BUN/Creatinine Ratio 31 (H) 10 - 24   Sodium 140 134 - 144 mmol/L   Potassium 4.0 3.5 - 5.2 mmol/L   Chloride 106 96 - 106 mmol/L   CO2 23 20 - 29 mmol/L   Calcium 9.2 8.6 - 10.2 mg/dL   Total Protein 6.6 6.0 - 8.5 g/dL   Albumin 4.2 3.7 - 4.7 g/dL  Globulin, Total 2.4 1.5 - 4.5 g/dL   Albumin/Globulin Ratio 1.8 1.2 - 2.2   Bilirubin Total 0.3 0.0 - 1.2 mg/dL   Alkaline Phosphatase 56 44 - 121  IU/L   AST 29 0 - 40 IU/L   ALT 16 0 - 44 IU/L  Lipid Panel w/o Chol/HDL Ratio  Result Value Ref Range   Cholesterol, Total 143 100 - 199 mg/dL   Triglycerides 64 0 - 149 mg/dL   HDL 62 >39 mg/dL   VLDL Cholesterol Cal 13 5 - 40 mg/dL   LDL Chol Calc (NIH) 68 0 - 99 mg/dL  TSH  Result Value Ref Range   TSH 1.430 0.450 - 4.500 uIU/mL  PSA  Result Value Ref Range   Prostate Specific Ag, Serum 1.5 0.0 - 4.0 ng/mL      Assessment & Plan:   Problem List Items Addressed This Visit       Cardiovascular and Mediastinum   Aortic atherosclerosis ([place]) - Primary    Noted on imaging 03/01/2017, reviewed with patient and educated. Continue Baby ASA 81 MG daily for prevention, does not wish to start statin.      Relevant Medications   aspirin 81 MG chewable tablet   Other Relevant Orders   Comprehensive metabolic panel   Lipid Panel w/o Chol/HDL Ratio     Genitourinary   BPH (benign prostatic hyperplasia)    Chronic, stable.  Continue current medication regimen and adjust as needed.  Would like PSA checked today and annually, is aware of guideline recommendations and wishes to continue blood checks.      Relevant Medications   finasteride (PROSCAR) 5 MG tablet   Other Relevant Orders   PSA   Other Visit Diagnoses     Encounter for lipid screening for cardiovascular disease       Lipid panel on labs today.   Relevant Orders   Comprehensive metabolic panel   Lipid Panel w/o Chol/HDL Ratio   Encounter for annual physical exam       Annual physical with labs today and health maintenance reviewed with patient.   Relevant Orders   CBC with Differential/Platelet   TSH       Discussed aspirin prophylaxis for myocardial infarction prevention and decision was it was not indicated  LABORATORY TESTING:  Health maintenance labs ordered today as discussed above.   The natural history of prostate cancer and ongoing controversy regarding screening and potential treatment outcomes of  prostate cancer has been discussed with the patient. The meaning of a false positive PSA and a false negative PSA has been discussed. He indicates understanding of the limitations of this screening test and wishes to proceed with screening PSA testing.   IMMUNIZATIONS:   - Tdap: Tetanus vaccination status reviewed: last tetanus booster within 10 years. - Influenza: refuses - Pneumovax: Up to date - Prevnar: Up to date - Zostavax vaccine: Up To Date  SCREENING: - Colonoscopy: Up to date  Discussed with patient purpose of the colonoscopy is to detect colon cancer at curable precancerous or early stages   - AAA Screening: Not applicable  -Hearing Test: Not applicable  -Spirometry: Not applicable   PATIENT COUNSELING:    Sexuality: Discussed sexually transmitted diseases, partner selection, use of condoms, avoidance of unintended pregnancy  and contraceptive alternatives.   Advised to avoid cigarette smoking.  I discussed with the patient that most people either abstain from alcohol or drink within safe limits (<=14/week and <=4 drinks/occasion for males, <=7/weeks and <=  3 drinks/occasion for females) and that the risk for alcohol disorders and other health effects rises proportionally with the number of drinks per week and how often a drinker exceeds daily limits.  Discussed cessation/primary prevention of drug use and availability of treatment for abuse.   Diet: Encouraged to adjust caloric intake to maintain  or achieve ideal body weight, to reduce intake of dietary saturated fat and total fat, to limit sodium intake by avoiding high sodium foods and not adding table salt, and to maintain adequate dietary potassium and calcium preferably from fresh fruits, vegetables, and low-fat dairy products.    Stressed the importance of regular exercise  Injury prevention: Discussed safety belts, safety helmets, smoke detector, smoking near bedding or upholstery.   Dental health: Discussed  importance of regular tooth brushing, flossing, and dental visits.   Follow up plan: NEXT PREVENTATIVE PHYSICAL DUE IN 1 YEAR. Return in about 1 year (around 09/11/2022) for Annual physical.

## 2021-09-10 NOTE — Assessment & Plan Note (Signed)
Noted on imaging 03/01/2017, reviewed with patient and educated. Continue Baby ASA 81 MG daily for prevention, does not wish to start statin.

## 2021-09-10 NOTE — Assessment & Plan Note (Signed)
Chronic, stable.  Continue current medication regimen and adjust as needed.  Would like PSA checked today and annually, is aware of guideline recommendations and wishes to continue blood checks.

## 2021-09-11 LAB — CBC WITH DIFFERENTIAL/PLATELET
Basophils Absolute: 0 10*3/uL (ref 0.0–0.2)
Basos: 1 %
EOS (ABSOLUTE): 0.2 10*3/uL (ref 0.0–0.4)
Eos: 3 %
Hematocrit: 36.4 % — ABNORMAL LOW (ref 37.5–51.0)
Hemoglobin: 12.2 g/dL — ABNORMAL LOW (ref 13.0–17.7)
Immature Grans (Abs): 0 10*3/uL (ref 0.0–0.1)
Immature Granulocytes: 0 %
Lymphocytes Absolute: 2.1 10*3/uL (ref 0.7–3.1)
Lymphs: 31 %
MCH: 31 pg (ref 26.6–33.0)
MCHC: 33.5 g/dL (ref 31.5–35.7)
MCV: 93 fL (ref 79–97)
Monocytes Absolute: 0.5 10*3/uL (ref 0.1–0.9)
Monocytes: 8 %
Neutrophils Absolute: 3.9 10*3/uL (ref 1.4–7.0)
Neutrophils: 57 %
Platelets: 220 10*3/uL (ref 150–450)
RBC: 3.93 x10E6/uL — ABNORMAL LOW (ref 4.14–5.80)
RDW: 12.1 % (ref 11.6–15.4)
WBC: 6.7 10*3/uL (ref 3.4–10.8)

## 2021-09-11 LAB — COMPREHENSIVE METABOLIC PANEL WITH GFR
ALT: 14 IU/L (ref 0–44)
AST: 27 IU/L (ref 0–40)
Albumin/Globulin Ratio: 1.8 (ref 1.2–2.2)
Albumin: 4.5 g/dL (ref 3.7–4.7)
Alkaline Phosphatase: 63 IU/L (ref 44–121)
BUN/Creatinine Ratio: 26 — ABNORMAL HIGH (ref 10–24)
BUN: 20 mg/dL (ref 8–27)
Bilirubin Total: 0.2 mg/dL (ref 0.0–1.2)
CO2: 23 mmol/L (ref 20–29)
Calcium: 9.6 mg/dL (ref 8.6–10.2)
Chloride: 107 mmol/L — ABNORMAL HIGH (ref 96–106)
Creatinine, Ser: 0.78 mg/dL (ref 0.76–1.27)
Globulin, Total: 2.5 g/dL (ref 1.5–4.5)
Glucose: 88 mg/dL (ref 70–99)
Potassium: 4.1 mmol/L (ref 3.5–5.2)
Sodium: 145 mmol/L — ABNORMAL HIGH (ref 134–144)
Total Protein: 7 g/dL (ref 6.0–8.5)
eGFR: 92 mL/min/1.73

## 2021-09-11 LAB — LIPID PANEL W/O CHOL/HDL RATIO
Cholesterol, Total: 138 mg/dL (ref 100–199)
HDL: 62 mg/dL
LDL Chol Calc (NIH): 61 mg/dL (ref 0–99)
Triglycerides: 78 mg/dL (ref 0–149)
VLDL Cholesterol Cal: 15 mg/dL (ref 5–40)

## 2021-09-11 LAB — PSA: Prostate Specific Ag, Serum: 1.3 ng/mL (ref 0.0–4.0)

## 2021-09-11 LAB — TSH: TSH: 1.67 u[IU]/mL (ref 0.450–4.500)

## 2021-09-11 NOTE — Progress Notes (Signed)
Contacted via [place] afternoon Jose [name], your labs have returned: - CBC continues to show some mild anemia present, which is baseline for you and I suspect from you being so active.  Please ensure you are taking a multivitamin with some iron in it daily. - Kidney function, creatinine and eGFR, remains normal, as is liver function, AST and ALT.  Sodium level mildly elevated, please ensure to drink plenty of water and cut back on salt intake. - Remainder of your labs are all stable!!  Great job!!  Any questions? Keep being amazing!!  Thank you for allowing me to participate in your care.  I appreciate you. Kindest regards, Kayana Thoen

## 2021-12-28 DIAGNOSIS — L570 Actinic keratosis: Secondary | ICD-10-CM | POA: Diagnosis not present

## 2021-12-28 DIAGNOSIS — C44619 Basal cell carcinoma of skin of left upper limb, including shoulder: Secondary | ICD-10-CM | POA: Diagnosis not present

## 2021-12-28 DIAGNOSIS — X32XXXA Exposure to sunlight, initial encounter: Secondary | ICD-10-CM | POA: Diagnosis not present

## 2021-12-28 DIAGNOSIS — D2272 Melanocytic nevi of left lower limb, including hip: Secondary | ICD-10-CM | POA: Diagnosis not present

## 2021-12-28 DIAGNOSIS — D485 Neoplasm of uncertain behavior of skin: Secondary | ICD-10-CM | POA: Diagnosis not present

## 2021-12-28 DIAGNOSIS — D2261 Melanocytic nevi of right upper limb, including shoulder: Secondary | ICD-10-CM | POA: Diagnosis not present

## 2021-12-28 DIAGNOSIS — Z85828 Personal history of other malignant neoplasm of skin: Secondary | ICD-10-CM | POA: Diagnosis not present

## 2021-12-28 DIAGNOSIS — D2262 Melanocytic nevi of left upper limb, including shoulder: Secondary | ICD-10-CM | POA: Diagnosis not present

## 2021-12-28 DIAGNOSIS — C44519 Basal cell carcinoma of skin of other part of trunk: Secondary | ICD-10-CM | POA: Diagnosis not present

## 2022-04-27 ENCOUNTER — Telehealth: Payer: Self-pay | Admitting: Nurse Practitioner

## 2022-04-27 NOTE — Telephone Encounter (Signed)
Copied from [place] [id number]. Topic: Medicare AWV >> Apr 27, 2022  1:24 PM Devoria Glassing wrote: Reason for CRM: Called patient to schedule Medicare Annual Wellness Visit (AWV). Left message for patient to call back and schedule Medicare Annual Wellness Visit (AWV).  Last date of AWV: 05/10/2021  Please schedule an appointment at any time with Kirke Shaggy, NHA  .  If any questions, please contact me.  Thank you ,  Sherol Dade; [place] Direct Dial: [phone]

## 2022-05-10 DIAGNOSIS — C44619 Basal cell carcinoma of skin of left upper limb, including shoulder: Secondary | ICD-10-CM | POA: Diagnosis not present

## 2022-05-24 ENCOUNTER — Telehealth: Payer: Self-pay | Admitting: Nurse Practitioner

## 2022-05-24 DIAGNOSIS — C44519 Basal cell carcinoma of skin of other part of trunk: Secondary | ICD-10-CM | POA: Diagnosis not present

## 2022-05-24 DIAGNOSIS — L570 Actinic keratosis: Secondary | ICD-10-CM | POA: Diagnosis not present

## 2022-05-24 NOTE — Telephone Encounter (Signed)
Copied from [place] [id number]. Topic: Medicare AWV >> May 24, 2022  1:10 PM Devoria Glassing wrote: Reason for CRM: Called patient to schedule Medicare Annual Wellness Visit (AWV). Left message for patient to call back and schedule Medicare Annual Wellness Visit (AWV).  Last date of AWV: 05/10/21  Please schedule an appointment at any time with Kirke Shaggy, LPN .  If any questions, please contact me.  Thank you ,  Sherol Dade; [place] Direct Dial: [phone]

## 2022-07-07 ENCOUNTER — Telehealth: Payer: Self-pay | Admitting: Nurse Practitioner

## 2022-07-07 NOTE — Telephone Encounter (Signed)
Copied from CRM [id number]. Topic: Medicare AWV >> Jul 07, 2022  9:48 AM Payton Doughty wrote: Reason for CRM: Called patient to schedule Medicare Annual Wellness Visit (AWV). Left message for patient to call back and schedule Medicare Annual Wellness Visit (AWV).  Last date of AWV: NONE  Please schedule an appointment at any time with Kennedy Bucker, LPN  .  If any questions, please contact me.  Thank you ,  Verlee Rossetti; Care Guide Ambulatory Clinical Support [place] [facility] Direct Dial: [phone]

## 2022-07-07 NOTE — Telephone Encounter (Signed)
Copied from CRM [id number]. Topic: Medicare AWV >> Jul 07, 2022  9:51 AM Payton Doughty wrote: Reason for CRM: Called patient to schedule Medicare Annual Wellness Visit (AWV). Left message for patient to call back and schedule Medicare Annual Wellness Visit (AWV).  Last date of AWV: NONE  Please schedule an appointment at any time with Kennedy Bucker, LPN  .  If any questions, please contact me.  Thank you ,  Verlee Rossetti; Care Guide Ambulatory Clinical Support [place] [facility] Direct Dial: [phone]

## 2022-07-21 DIAGNOSIS — L820 Inflamed seborrheic keratosis: Secondary | ICD-10-CM | POA: Diagnosis not present

## 2022-07-21 DIAGNOSIS — Z85828 Personal history of other malignant neoplasm of skin: Secondary | ICD-10-CM | POA: Diagnosis not present

## 2022-07-21 DIAGNOSIS — D2272 Melanocytic nevi of left lower limb, including hip: Secondary | ICD-10-CM | POA: Diagnosis not present

## 2022-07-21 DIAGNOSIS — D225 Melanocytic nevi of trunk: Secondary | ICD-10-CM | POA: Diagnosis not present

## 2022-07-21 DIAGNOSIS — C44619 Basal cell carcinoma of skin of left upper limb, including shoulder: Secondary | ICD-10-CM | POA: Diagnosis not present

## 2022-07-21 DIAGNOSIS — D2262 Melanocytic nevi of left upper limb, including shoulder: Secondary | ICD-10-CM | POA: Diagnosis not present

## 2022-07-21 DIAGNOSIS — D485 Neoplasm of uncertain behavior of skin: Secondary | ICD-10-CM | POA: Diagnosis not present

## 2022-07-21 DIAGNOSIS — L570 Actinic keratosis: Secondary | ICD-10-CM | POA: Diagnosis not present

## 2022-07-21 DIAGNOSIS — C44519 Basal cell carcinoma of skin of other part of trunk: Secondary | ICD-10-CM | POA: Diagnosis not present

## 2022-08-02 ENCOUNTER — Telehealth: Payer: Self-pay | Admitting: Nurse Practitioner

## 2022-08-02 NOTE — Telephone Encounter (Signed)
Copied from CRM [id number]. Topic: Medicare AWV >> Aug 02, 2022  9:59 AM Payton Doughty wrote: Reason for CRM: Called patient to schedule Medicare Annual Wellness Visit (AWV). Left message for patient to call back and schedule Medicare Annual Wellness Visit (AWV).  Last date of AWV: none  Please schedule an appointment at any time with Kennedy Bucker, LPN  .  If any questions, please contact me.  Thank you ,  Verlee Rossetti; Care Guide Ambulatory Clinical Support [place] [facility] Direct Dial: [phone]

## 2022-08-25 DIAGNOSIS — C44519 Basal cell carcinoma of skin of other part of trunk: Secondary | ICD-10-CM | POA: Diagnosis not present

## 2022-09-10 NOTE — Patient Instructions (Signed)
Be Involved in Your Health Care:  Taking Medications When medications are taken as directed, they can greatly improve your health. But if they are not taken as instructed, they may not work. In some cases, not taking them correctly can be harmful. To help ensure your treatment remains effective and safe, understand your medications and how to take them.  Your lab results, notes and after visit summary will be available on My Chart. We strongly encourage you to use this feature. If lab results are abnormal the clinic will contact you with the appropriate steps. If the clinic does not contact you assume the results are satisfactory. You can always see your results on My Chart. If you have questions regarding your condition, please contact the clinic during office hours. You can also ask questions on My Chart.  We at [facility] are grateful that you chose us to provide care. We strive to provide excellent and compassionate care and are always looking for feedback. If you get a survey from the clinic please complete this.   Preventing High Cholesterol Cholesterol is a white, waxy substance similar to fat that the human body needs to help build cells. The liver makes all the cholesterol that a person's body needs. Having high cholesterol (hypercholesterolemia) increases your risk for heart disease and stroke. Extra or excess cholesterol comes from the food that you eat. High cholesterol can often be prevented with diet and lifestyle changes. If you already have high cholesterol, you can control it with diet, lifestyle changes, and medicines. How can high cholesterol affect me? If you have high cholesterol, fatty deposits (plaques) may build up on the walls of your blood vessels. The blood vessels that carry blood away from your heart are called arteries. Plaques make the arteries narrower and stiffer. This in turn can: Restrict or block blood flow and cause blood clots to form. Increase your  risk for heart attack and stroke. What can increase my risk for high cholesterol? This condition is more likely to develop in people who: Eat foods that are high in saturated fat or cholesterol. Saturated fat is mostly found in foods that come from animal sources. Are overweight. Are not getting enough exercise. Use products that contain nicotine or tobacco, such as cigarettes, e-cigarettes, and chewing tobacco. Have a family history of high cholesterol (familial hypercholesterolemia). What actions can I take to prevent this? Nutrition  Eat less saturated fat. Avoid trans fats (partially hydrogenated oils). These are often found in margarine and in some baked goods, fried foods, and snacks bought in packages. Avoid precooked or cured meat, such as bacon, sausages, or meat loaves. Avoid foods and drinks that have added sugars. Eat more fruits, vegetables, and whole grains. Choose healthy sources of protein, such as fish, poultry, lean cuts of red meat, beans, peas, lentils, and nuts. Choose healthy sources of fat, such as: Nuts. Vegetable oils, especially olive oil. Fish that have healthy fats, such as omega-3 fatty acids. These fish include mackerel or salmon. Lifestyle Lose weight if you are overweight. Maintaining a healthy body mass index (BMI) can help prevent or control high cholesterol. It can also lower your risk for diabetes and high blood pressure. Ask your health care provider to help you with a diet and exercise plan to lose weight safely. Do not use any products that contain nicotine or tobacco. These products include cigarettes, chewing tobacco, and vaping devices, such as e-cigarettes. If you need help quitting, ask your health care provider. Alcohol use   Do not drink alcohol if: Your health care provider tells you not to drink. You are pregnant, may be pregnant, or are planning to become pregnant. If you drink alcohol: Limit how much you have to: 0-1 drink a day for  women. 0-2 drinks a day for men. Know how much alcohol is in your drink. In the [place] one drink equals one 12 oz bottle of beer (355 mL), one 5 oz glass of wine (148 mL), or one 1 oz glass of hard liquor (44 mL). Activity  Get enough exercise. Do exercises as told by your health care provider. Each week, do at least 150 minutes of exercise that takes a medium level of effort (moderate-intensity exercise). This kind of exercise: Makes your heart beat faster while allowing you to still be able to talk. Can be done in short sessions several times a day or longer sessions a few times a week. For example, on 5 days each week, you could walk fast or ride your bike 3 times a day for 10 minutes each time. Medicines Your health care provider may recommend medicines to help lower cholesterol. This may be a medicine to lower the amount of cholesterol that your liver makes. You may need medicine if: Diet and lifestyle changes have not lowered your cholesterol enough. You have high cholesterol and other risk factors for heart disease or stroke. Take over-the-counter and prescription medicines only as told by your health care provider. General information Manage your risk factors for high cholesterol. Talk with your health care provider about all your risk factors and how to lower your risk. Manage other conditions that you have, such as diabetes or high blood pressure (hypertension). Have blood tests to check your cholesterol levels at regular points in time as told by your health care provider. Keep all follow-up visits. This is important. Where to find more information American Heart Association: www.heart.org National Heart, Lung, and Blood Institute: www.nhlbi.nih.gov Summary High cholesterol increases your risk for heart disease and stroke. By keeping your cholesterol level low, you can reduce your risk for these conditions. High cholesterol can often be prevented with diet and lifestyle  changes. Work with your health care provider to manage your risk factors, and have your blood tested regularly. This information is not intended to replace advice given to you by your health care provider. Make sure you discuss any questions you have with your health care provider. Document Revised: 09/24/2021 Document Reviewed: 04/27/2020 Elsevier Patient Education  2024 Elsevier Inc.  

## 2022-09-12 ENCOUNTER — Encounter: Payer: Self-pay | Admitting: Nurse Practitioner

## 2022-09-12 ENCOUNTER — Ambulatory Visit (INDEPENDENT_AMBULATORY_CARE_PROVIDER_SITE_OTHER): Payer: PPO | Admitting: Nurse Practitioner

## 2022-09-12 VITALS — BP 132/70 | HR 52 | Temp 97.6°F | Ht 65.75 in | Wt 134.4 lb

## 2022-09-12 DIAGNOSIS — Z0000 Encounter for general adult medical examination without abnormal findings: Secondary | ICD-10-CM

## 2022-09-12 DIAGNOSIS — N401 Enlarged prostate with lower urinary tract symptoms: Secondary | ICD-10-CM

## 2022-09-12 DIAGNOSIS — R3912 Poor urinary stream: Secondary | ICD-10-CM | POA: Diagnosis not present

## 2022-09-12 DIAGNOSIS — I700 Atherosclerosis of aorta: Secondary | ICD-10-CM

## 2022-09-12 MED ORDER — FINASTERIDE 5 MG PO TABS: 5.0000 mg | ORAL_TABLET | Freq: Every morning | ORAL | 4 refills | Status: DC

## 2022-09-12 NOTE — Assessment & Plan Note (Signed)
Chronic, stable.  Continue current medication regimen and adjust as needed.  Would like PSA checked today and annually, is aware of guideline recommendations and wishes to continue blood checks. 

## 2022-09-12 NOTE — Assessment & Plan Note (Signed)
Noted on imaging 03/01/2017, reviewed with patient and educated. Continue Baby ASA 81 MG daily for prevention, does not wish to start statin. 

## 2022-09-12 NOTE — Progress Notes (Signed)
BP 132/70 (BP Location: Left Arm, Patient Position: Sitting, Cuff Size: Normal)   Pulse (!) 52   Temp 97.6 F (36.4 C) (Oral)   Ht 5' 5.75" (1.67 m)   Wt 134 lb 6.4 oz (61 kg)   SpO2 99%   BMI 21.86 kg/m    Subjective:    Patient ID: Jose [name], male    DOB: [birth year], 77 y.o.   MRN: [id number]  HPI: Jose [name] is a 77 y.o. male presenting on 09/12/2022 for Medicare Wellness and annual physical. Current medical complaints include:none  He currently lives with: self Interim Problems from his last visit: no   Continues to cycle daily, long distances.  BP at home 117/62 and 50 HR after cycling today.  BPH Continues on Finasteride 5 MG at bedtime.  Aortic atherosclerosis noted on past imaging. BPH status: controlled Satisfied with current treatment?: yes Medication side effects: no Medication compliance: good compliance Duration: chronic Nocturia: 1/night Urinary frequency:no Incomplete voiding: no Urgency: no Weak urinary stream: no Straining to start stream: no Dysuria: no Onset: gradual Severity: mild The 10-year ASCVD risk score (Arnett DK, et al., 2019) is: 25.2%   Values used to calculate the score:     Age: 74 years     Sex: Male     Is Non-Hispanic African American: No     Diabetic: No     Tobacco smoker: No     Systolic Blood Pressure: 132 mmHg     Is BP treated: No     HDL Cholesterol: 62 mg/dL     Total Cholesterol: 138 mg/dL  Functional Status Survey: Is the patient deaf or have difficulty hearing?: No Does the patient have difficulty seeing, even when wearing glasses/contacts?: No Does the patient have difficulty concentrating, remembering, or making decisions?: No Does the patient have difficulty walking or climbing stairs?: No Does the patient have difficulty dressing or bathing?: No Does the patient have difficulty doing errands alone such as visiting a doctor's office or shopping?: No  FALL RISK:    09/12/2022    3:28 PM 09/12/2022     3:22 PM 09/10/2021    2:52 PM 05/10/2021    9:53 AM 09/02/2020    2:57 PM  Fall Risk   Falls in the past year? 0 0 0 0 0  Number falls in past yr: 0 0 0 0 0  Injury with Fall? 0 0 0 0 0  Risk for fall due to : No Fall Risks No Fall Risks No Fall Risks  No Fall Risks  Follow up Falls evaluation completed Falls prevention discussed Falls evaluation completed Falls evaluation completed;Falls prevention discussed Falls evaluation completed    Depression Screen    09/12/2022    3:28 PM 09/12/2022    3:23 PM 09/10/2021    2:52 PM 05/10/2021   10:05 AM 09/02/2020    2:58 PM  Depression screen PHQ 2/9  Decreased Interest 0 0 0 0 0  Down, Depressed, Hopeless 0 0 0 0 0  PHQ - 2 Score 0 0 0 0 0  Altered sleeping 0 0 0    Tired, decreased energy 0 0 0    Change in appetite 0 0 0    Feeling bad or failure about yourself  0 0 0    Trouble concentrating 0 0 0    Moving slowly or fidgety/restless 0 0 0    Suicidal thoughts 0 0 0    PHQ-9 Score 0 0 0  Difficult doing work/chores Not difficult at all Not difficult at all Not difficult at all        09/12/2022    3:28 PM 09/12/2022    3:23 PM 09/10/2021    2:53 PM  GAD 7 : Generalized Anxiety Score  Nervous, Anxious, on Edge 0 0 0  Control/stop worrying 0 0 0  Worry too much - different things 0 0 0  Trouble relaxing 0 0 0  Restless 0 0 0  Easily annoyed or irritable 0 0 0  Afraid - awful might happen 0 0 0  Total GAD 7 Score 0 0 0  Anxiety Difficulty Not difficult at all Not difficult at all Not difficult at all     Advanced Directives <no information>  Past Medical History:  Past Medical History:  Diagnosis Date   Asthma    as a child   Basal cell carcinoma (BCC) of scalp 08/2015   Collapsed lung 02/2017   Family history of adverse reaction to anesthesia    brother-PT UNSURE BUT WILL FIND OUT REACTION   GERD (gastroesophageal reflux disease)    occ   Osteoporosis     Surgical History:  Past Surgical History:  Procedure Laterality  Date   broken collar bone  2002/2007   FRACTURE SURGERY  2007   broken left thigh bone   FRACTURE SURGERY  2007   broken left upper arm   HERNIA REPAIR     LEG SURGERY     ORIF CALCANEOUS FRACTURE Right 12/29/2017   Procedure: OPEN REDUCTION INTERNAL FIXATION (ORIF) CALCANEOUS FRACTURE;  Surgeon: Gwyneth Revels, DPM;  Location: [facility] ORS;  Service: Podiatry;  Laterality: Right;    Medications:  Current Outpatient Medications on File Prior to Visit  Medication Sig   aspirin 81 MG chewable tablet Chew 81 mg by mouth daily.   calcium-vitamin D (OSCAL WITH D) 500-200 MG-UNIT tablet Take 1 tablet by mouth.   Multiple Vitamin (MULTIVITAMIN) tablet Take 1 tablet by mouth daily.   No current facility-administered medications on file prior to visit.    Allergies:  No Known Allergies  Social History:  Social History   Socioeconomic History   Marital status: Widowed    Spouse name: Not on file   Number of children: Not on file   Years of education: 2 associate degreee    Highest education level: Associate degree: academic program  Occupational History   Occupation: retired   Tobacco Use   Smoking status: Former    Years: 4    Types: Cigarettes    Quit date: 02/10/1969    Years since quitting: 53.6   Smokeless tobacco: Never  Vaping Use   Vaping Use: Never used  Substance and Sexual Activity   Alcohol use: No   Drug use: No   Sexual activity: Not on file  Other Topics Concern   Not on file  Social History Narrative   Not on file   Social Determinants of Health   Financial Resource Strain: Low Risk  (09/12/2022)   Overall Financial Resource Strain (CARDIA)    Difficulty of Paying Living Expenses: Not hard at all  Food Insecurity: No Food Insecurity (09/12/2022)   Hunger Vital Sign    Worried About Running Out of Food in the Last Year: Never true    Ran Out of Food in the Last Year: Never true  Transportation Needs: No Transportation Needs (09/12/2022)   PRAPARE -  Administrator, Civil Service (Medical): No  Lack of Transportation (Non-Medical): No  Physical Activity: Sufficiently Active (09/12/2022)   Exercise Vital Sign    Days of Exercise per Week: 5 days    Minutes of Exercise per Session: 70 min  Stress: No Stress Concern Present (09/12/2022)   [employer] of Occupational Health - Occupational Stress Questionnaire    Feeling of Stress : Not at all  Social Connections: Socially Isolated (09/12/2022)   Social Connection and Isolation Panel [NHANES]    Frequency of Communication with Friends and Family: More than three times a week    Frequency of Social Gatherings with Friends and Family: More than three times a week    Attends Religious Services: Never    Database administrator or Organizations: No    Attends Banker Meetings: Never    Marital Status: Separated  Intimate Partner Violence: Not At Risk (09/12/2022)   Humiliation, Afraid, Rape, and Kick questionnaire    Fear of Current or Ex-Partner: No    Emotionally Abused: No    Physically Abused: No    Sexually Abused: No   Social History   Tobacco Use  Smoking Status Former   Years: 4   Types: Cigarettes   Quit date: 02/10/1969   Years since quitting: 53.6  Smokeless Tobacco Never   Social History   Substance and Sexual Activity  Alcohol Use No    Family History:  Family History  Problem Relation Age of Onset   Congestive Heart Failure Mother    Obesity Brother    Cancer Paternal Grandfather        leukemia   Past medical history, surgical history, medications, allergies, family history and social history reviewed with patient today and changes made to appropriate areas of the chart.   Review of Systems - negative All other ROS negative except what is listed above and in the HPI.      Objective:    BP 132/70 (BP Location: Left Arm, Patient Position: Sitting, Cuff Size: Normal)   Pulse (!) 52   Temp 97.6 F (36.4 C) (Oral)   Ht 5' 5.75"  (1.67 m)   Wt 134 lb 6.4 oz (61 kg)   SpO2 99%   BMI 21.86 kg/m   Wt Readings from Last 3 Encounters:  09/12/22 134 lb 6.4 oz (61 kg)  09/10/21 137 lb 9.6 oz (62.4 kg)  09/02/20 135 lb 9.6 oz (61.5 kg)    Physical Exam Vitals and nursing note reviewed.  Constitutional:      Jose: He is awake. He is not in acute distress.    Appearance: He is well-developed and well-groomed. He is not ill-appearing.  HENT:     Head: Normocephalic and atraumatic.     Right Ear: Hearing, tympanic membrane, ear canal and external ear normal. No drainage.     Left Ear: Hearing, tympanic membrane, ear canal and external ear normal. No drainage.     Nose: Nose normal.     Mouth/Throat:     Pharynx: Uvula midline.  Eyes:     Jose: Lids are normal.        Right eye: No discharge.        Left eye: No discharge.     Extraocular Movements: Extraocular movements intact.     Conjunctiva/sclera: Conjunctivae normal.     Pupils: Pupils are equal, round, and reactive to light.     Visual Fields: Right eye visual fields normal and left eye visual fields normal.  Neck:  Thyroid: No thyromegaly.     Vascular: No carotid bruit or JVD.     Trachea: Trachea normal.  Cardiovascular:     Rate and Rhythm: Regular rhythm. Bradycardia present.     Heart sounds: Normal heart sounds, S1 normal and S2 normal. No murmur heard.    No gallop.  Pulmonary:     Effort: Pulmonary effort is normal. No accessory muscle usage or respiratory distress.     Breath sounds: Normal breath sounds.  Abdominal:     Jose: Bowel sounds are normal.     Palpations: Abdomen is soft. There is no hepatomegaly or splenomegaly.     Tenderness: There is no abdominal tenderness.  Genitourinary:    Penis: Normal.      Testes: Normal.     Prostate: Normal.     Rectum: Normal.     Comments: Refused chaperone Musculoskeletal:        Jose: Normal range of motion.     Cervical back: Normal range of motion and neck supple.      Right lower leg: No edema.     Left lower leg: No edema.  Lymphadenopathy:     Head:     Right side of head: No submental, submandibular, tonsillar, preauricular or posterior auricular adenopathy.     Left side of head: No submental, submandibular, tonsillar, preauricular or posterior auricular adenopathy.     Cervical: No cervical adenopathy.  Skin:    Jose: Skin is warm and dry.     Capillary Refill: Capillary refill takes less than 2 seconds.     Findings: No rash.  Neurological:     Mental Status: He is alert and oriented to person, place, and time.     Gait: Gait is intact.     Deep Tendon Reflexes: Reflexes are normal and symmetric.     Reflex Scores:      Brachioradialis reflexes are 2+ on the right side and 2+ on the left side.      Patellar reflexes are 2+ on the right side and 2+ on the left side. Psychiatric:        Attention and Perception: Attention normal.        Mood and Affect: Mood normal.        Speech: Speech normal.        Behavior: Behavior normal. Behavior is cooperative.        Thought Content: Thought content normal.        Cognition and Memory: Cognition normal.        Judgment: Judgment normal.       09/12/2022    3:35 PM 07/16/2018    9:17 AM  6CIT Screen  What Year? 0 points 0 points  What month? 0 points 0 points  What time? 0 points 0 points  Count back from 20 0 points 0 points  Months in reverse 0 points 0 points  Repeat phrase 0 points 0 points  Total Score 0 points 0 points   Results for orders placed or performed in visit on 09/10/21  CBC with Differential/Platelet  Result Value Ref Range   WBC 6.7 3.4 - 10.8 x10E3/uL   RBC 3.93 (L) 4.14 - 5.80 x10E6/uL   Hemoglobin 12.2 (L) 13.0 - 17.7 g/dL   Hematocrit 16.1 (L) 09.6 - 51.0 %   MCV 93 79 - 97 fL   MCH 31.0 26.6 - 33.0 pg   MCHC 33.5 31.5 - 35.7 g/dL   RDW 04.5 40.9 - 81.1 %  Platelets 220 150 - 450 x10E3/uL   Neutrophils 57 Not Estab. %   Lymphs 31 Not Estab. %   Monocytes 8  Not Estab. %   Eos 3 Not Estab. %   Basos 1 Not Estab. %   Neutrophils Absolute 3.9 1.4 - 7.0 x10E3/uL   Lymphocytes Absolute 2.1 0.7 - 3.1 x10E3/uL   Monocytes Absolute 0.5 0.1 - 0.9 x10E3/uL   EOS (ABSOLUTE) 0.2 0.0 - 0.4 x10E3/uL   Basophils Absolute 0.0 0.0 - 0.2 x10E3/uL   Immature Granulocytes 0 Not Estab. %   Immature Grans (Abs) 0.0 0.0 - 0.1 x10E3/uL  Comprehensive metabolic panel  Result Value Ref Range   Glucose 88 70 - 99 mg/dL   BUN 20 8 - 27 mg/dL   Creatinine, Ser 1.61 0.76 - 1.27 mg/dL   eGFR 92 >09 UE/AVW/0.98   BUN/Creatinine Ratio 26 (H) 10 - 24   Sodium 145 (H) 134 - 144 mmol/L   Potassium 4.1 3.5 - 5.2 mmol/L   Chloride 107 (H) 96 - 106 mmol/L   CO2 23 20 - 29 mmol/L   Calcium 9.6 8.6 - 10.2 mg/dL   Total Protein 7.0 6.0 - 8.5 g/dL   Albumin 4.5 3.7 - 4.7 g/dL   Globulin, Total 2.5 1.5 - 4.5 g/dL   Albumin/Globulin Ratio 1.8 1.2 - 2.2   Bilirubin Total 0.2 0.0 - 1.2 mg/dL   Alkaline Phosphatase 63 44 - 121 IU/L   AST 27 0 - 40 IU/L   ALT 14 0 - 44 IU/L  Lipid Panel w/o Chol/HDL Ratio  Result Value Ref Range   Cholesterol, Total 138 100 - 199 mg/dL   Triglycerides 78 0 - 149 mg/dL   HDL 62 >11 mg/dL   VLDL Cholesterol Cal 15 5 - 40 mg/dL   LDL Chol Calc (NIH) 61 0 - 99 mg/dL  TSH  Result Value Ref Range   TSH 1.670 0.450 - 4.500 uIU/mL  PSA  Result Value Ref Range   Prostate Specific Ag, Serum 1.3 0.0 - 4.0 ng/mL      Assessment & Plan:   Problem List Items Addressed This Visit       Cardiovascular and Mediastinum   Aortic atherosclerosis (HCC)    Noted on imaging 03/01/2017, reviewed with patient and educated. Continue Baby ASA 81 MG daily for prevention, does not wish to start statin.      Relevant Orders   Comprehensive metabolic panel   Lipid Panel w/o Chol/HDL Ratio     Genitourinary   BPH (benign prostatic hyperplasia)    Chronic, stable.  Continue current medication regimen and adjust as needed.  Would like PSA checked today and  annually, is aware of guideline recommendations and wishes to continue blood checks.      Relevant Medications   finasteride (PROSCAR) 5 MG tablet   Other Relevant Orders   PSA   Other Visit Diagnoses     Medicare annual wellness visit, subsequent    -  Primary   Due for Medicare Wellness and performed today with patient.   Encounter for annual physical exam       Annual physical today with labs and health maintenance reviewed, discussed with patient.   Relevant Orders   CBC with Differential/Platelet   TSH       Discussed aspirin prophylaxis for myocardial infarction prevention and decision was it was not indicated  LABORATORY TESTING:  Health maintenance labs ordered today as discussed above.   The natural history of  prostate cancer and ongoing controversy regarding screening and potential treatment outcomes of prostate cancer has been discussed with the patient. The meaning of a false positive PSA and a false negative PSA has been discussed. He indicates understanding of the limitations of this screening test and wishes to proceed with screening PSA testing.   IMMUNIZATIONS:   - Tdap: Tetanus vaccination status reviewed: last tetanus booster within 10 years. - Influenza: refuses - Pneumovax: Up to date - Prevnar: Up to date - Zostavax vaccine: Up To Date  SCREENING: - Colonoscopy: Up to date  Discussed with patient purpose of the colonoscopy is to detect colon cancer at curable precancerous or early stages   - AAA Screening: Not applicable  -Hearing Test: Not applicable  -Spirometry: Not applicable   PATIENT COUNSELING:    Sexuality: Discussed sexually transmitted diseases, partner selection, use of condoms, avoidance of unintended pregnancy  and contraceptive alternatives.   Advised to avoid cigarette smoking.  I discussed with the patient that most people either abstain from alcohol or drink within safe limits (<=14/week and <=4 drinks/occasion for males,  <=7/weeks and <= 3 drinks/occasion for females) and that the risk for alcohol disorders and other health effects rises proportionally with the number of drinks per week and how often a drinker exceeds daily limits.  Discussed cessation/primary prevention of drug use and availability of treatment for abuse.   Diet: Encouraged to adjust caloric intake to maintain  or achieve ideal body weight, to reduce intake of dietary saturated fat and total fat, to limit sodium intake by avoiding high sodium foods and not adding table salt, and to maintain adequate dietary potassium and calcium preferably from fresh fruits, vegetables, and low-fat dairy products.    Stressed the importance of regular exercise  Injury prevention: Discussed safety belts, safety helmets, smoke detector, smoking near bedding or upholstery.   Dental health: Discussed importance of regular tooth brushing, flossing, and dental visits.   Follow up plan: NEXT PREVENTATIVE PHYSICAL DUE IN 1 YEAR. Return in about 1 year (around 09/12/2023) for Annnual physical.

## 2022-09-13 LAB — CBC WITH DIFFERENTIAL/PLATELET
Basophils Absolute: 0.1 10*3/uL (ref 0.0–0.2)
Basos: 1 %
EOS (ABSOLUTE): 0.1 10*3/uL (ref 0.0–0.4)
Eos: 2 %
Hematocrit: 35.9 % — ABNORMAL LOW (ref 37.5–51.0)
Hemoglobin: 12 g/dL — ABNORMAL LOW (ref 13.0–17.7)
Immature Grans (Abs): 0 10*3/uL (ref 0.0–0.1)
Immature Granulocytes: 0 %
Lymphocytes Absolute: 2.4 10*3/uL (ref 0.7–3.1)
Lymphs: 34 %
MCH: 30.6 pg (ref 26.6–33.0)
MCHC: 33.4 g/dL (ref 31.5–35.7)
MCV: 92 fL (ref 79–97)
Monocytes Absolute: 0.4 10*3/uL (ref 0.1–0.9)
Monocytes: 6 %
Neutrophils Absolute: 4 10*3/uL (ref 1.4–7.0)
Neutrophils: 57 %
Platelets: 250 10*3/uL (ref 150–450)
RBC: 3.92 x10E6/uL — ABNORMAL LOW (ref 4.14–5.80)
RDW: 11.9 % (ref 11.6–15.4)
WBC: 7 10*3/uL (ref 3.4–10.8)

## 2022-09-13 LAB — LIPID PANEL W/O CHOL/HDL RATIO
Cholesterol, Total: 150 mg/dL (ref 100–199)
HDL: 63 mg/dL (ref >39)
LDL Chol Calc (NIH): 71 mg/dL (ref 0–99)
Triglycerides: 88 mg/dL (ref 0–149)
VLDL Cholesterol Cal: 16 mg/dL (ref 5–40)

## 2022-09-13 LAB — COMPREHENSIVE METABOLIC PANEL
ALT: 16 IU/L (ref 0–44)
AST: 30 IU/L (ref 0–40)
Albumin: 4.4 g/dL (ref 3.8–4.8)
Alkaline Phosphatase: 61 IU/L (ref 44–121)
BUN/Creatinine Ratio: 27 — ABNORMAL HIGH (ref 10–24)
BUN: 23 mg/dL (ref 8–27)
Bilirubin Total: 0.2 mg/dL (ref 0.0–1.2)
CO2: 24 mmol/L (ref 20–29)
Calcium: 9.6 mg/dL (ref 8.6–10.2)
Chloride: 103 mmol/L (ref 96–106)
Creatinine, Ser: 0.85 mg/dL (ref 0.76–1.27)
Globulin, Total: 2.4 g/dL (ref 1.5–4.5)
Glucose: 95 mg/dL (ref 70–99)
Potassium: 4.1 mmol/L (ref 3.5–5.2)
Sodium: 140 mmol/L (ref 134–144)
Total Protein: 6.8 g/dL (ref 6.0–8.5)
eGFR: 89 mL/min/{1.73_m2} (ref >59)

## 2022-09-13 LAB — TSH: TSH: 1.87 u[IU]/mL (ref 0.450–4.500)

## 2022-09-13 LAB — PSA: Prostate Specific Ag, Serum: 1.4 ng/mL (ref 0.0–4.0)

## 2022-09-13 NOTE — Progress Notes (Signed)
Contacted via MyChart   Good afternoon Mellody Dance, your labs have returned and overall remain stable and at baseline for you.  You continue to show baseline mildly low hemoglobin and hematocrit, anemia, I recommend continue your daily vitamin and add some iron rich foods to diet daily.  Continue the great work!! Keep being amazing!!  Thank you for allowing me to participate in your care.  I appreciate you. Kindest regards, Arleta Ostrum

## 2022-09-14 ENCOUNTER — Telehealth: Payer: Self-pay | Admitting: Nurse Practitioner

## 2022-09-14 NOTE — Telephone Encounter (Signed)
Copied from CRM [id number]. Topic: General - Other >> Sep 14, 2022  3:58 PM Carrielelia G wrote: Jose [name] called and stated he would like to speak with [name] regarding his lab results.  I did offer him to speak with a nurse at that time but he declined and only wants to speak to [facility].

## 2022-09-15 NOTE — Telephone Encounter (Signed)
Called patient to discuss lab results.  No answer. HIPAA compliant vm left requesting return call.

## 2022-11-08 DIAGNOSIS — L910 Hypertrophic scar: Secondary | ICD-10-CM | POA: Diagnosis not present

## 2023-02-21 DIAGNOSIS — D2262 Melanocytic nevi of left upper limb, including shoulder: Secondary | ICD-10-CM | POA: Diagnosis not present

## 2023-02-21 DIAGNOSIS — L821 Other seborrheic keratosis: Secondary | ICD-10-CM | POA: Diagnosis not present

## 2023-02-21 DIAGNOSIS — Z08 Encounter for follow-up examination after completed treatment for malignant neoplasm: Secondary | ICD-10-CM | POA: Diagnosis not present

## 2023-02-21 DIAGNOSIS — L570 Actinic keratosis: Secondary | ICD-10-CM | POA: Diagnosis not present

## 2023-02-21 DIAGNOSIS — D2261 Melanocytic nevi of right upper limb, including shoulder: Secondary | ICD-10-CM | POA: Diagnosis not present

## 2023-02-21 DIAGNOSIS — D225 Melanocytic nevi of trunk: Secondary | ICD-10-CM | POA: Diagnosis not present

## 2023-02-21 DIAGNOSIS — D2272 Melanocytic nevi of left lower limb, including hip: Secondary | ICD-10-CM | POA: Diagnosis not present

## 2023-02-21 DIAGNOSIS — D2271 Melanocytic nevi of right lower limb, including hip: Secondary | ICD-10-CM | POA: Diagnosis not present

## 2023-02-21 DIAGNOSIS — C44519 Basal cell carcinoma of skin of other part of trunk: Secondary | ICD-10-CM | POA: Diagnosis not present

## 2023-02-21 DIAGNOSIS — D485 Neoplasm of uncertain behavior of skin: Secondary | ICD-10-CM | POA: Diagnosis not present

## 2023-02-21 DIAGNOSIS — Z85828 Personal history of other malignant neoplasm of skin: Secondary | ICD-10-CM | POA: Diagnosis not present

## 2023-04-15 DIAGNOSIS — W540XXA Bitten by dog, initial encounter: Secondary | ICD-10-CM | POA: Diagnosis not present

## 2023-04-15 DIAGNOSIS — Z23 Encounter for immunization: Secondary | ICD-10-CM | POA: Diagnosis not present

## 2023-04-15 DIAGNOSIS — S81831A Puncture wound without foreign body, right lower leg, initial encounter: Secondary | ICD-10-CM | POA: Diagnosis not present

## 2023-05-10 ENCOUNTER — Encounter: Payer: Self-pay | Admitting: Nurse Practitioner

## 2023-05-10 ENCOUNTER — Ambulatory Visit (INDEPENDENT_AMBULATORY_CARE_PROVIDER_SITE_OTHER): Admitting: Nurse Practitioner

## 2023-05-10 VITALS — BP 123/69 | HR 65 | Temp 97.8°F | Ht 65.5 in | Wt 150.6 lb

## 2023-05-10 DIAGNOSIS — T148XXA Other injury of unspecified body region, initial encounter: Secondary | ICD-10-CM

## 2023-05-10 NOTE — Patient Instructions (Signed)

## 2023-05-10 NOTE — Progress Notes (Signed)
 BP 123/69   Pulse 65   Temp 97.8 F (36.6 C) (Oral)   Ht 5' 5.5" (1.664 m)   Wt 150 lb 9.6 oz (68.3 kg)   SpO2 98%   BMI 24.68 kg/m    Subjective:    Patient ID: Jose [name], male    DOB: January 31, 1946, 78 y.o.   MRN: [id number]  HPI: Jose [name] is a 78 y.o. male  Chief Complaint  Patient presents with   Wound Check    Patient states he was bit by a dog about a month ago on his R leg. States he was seen at [facility]- given antibiotics, tetanus, and care instructions. Patient states he has fluid pockets around the puncture wound and would like them checked out.    SKIN INFECTION Was bit by a dog on 03/27/23 to right leg.  Did go to urgent care at the time and was placed on Augmentin + provided a tetanus shot to him. Would like wound looked at, has some fluid pockets present. Duration: weeks Location: right leg History of trauma in area: yes Pain: no Quality: no Severity: none Redness: no Swelling: yes, a little puffy Oozing: no Pus: no Fevers: no Nausea/vomiting: no Status: stable Treatments attempted:antibiotics  Tetanus: UTD   Relevant past medical, surgical, family and social history reviewed and updated as indicated. Interim medical history since our last visit reviewed. Allergies and medications reviewed and updated.  Review of Systems  Constitutional:  Negative for activity change, diaphoresis, fatigue and fever.  Respiratory:  Negative for cough, chest tightness, shortness of breath and wheezing.   Cardiovascular:  Negative for chest pain, palpitations and leg swelling.  Gastrointestinal: Negative.   Skin:  Positive for wound.  Psychiatric/Behavioral: Negative.      Per HPI unless specifically indicated above     Objective:    BP 123/69   Pulse 65   Temp 97.8 F (36.6 C) (Oral)   Ht 5' 5.5" (1.664 m)   Wt 150 lb 9.6 oz (68.3 kg)   SpO2 98%   BMI 24.68 kg/m   Wt Readings from Last 3 Encounters:  05/10/23 150 lb 9.6 oz (68.3 kg)  09/12/22 134  lb 6.4 oz (61 kg)  09/10/21 137 lb 9.6 oz (62.4 kg)    Physical Exam Vitals and nursing note reviewed.  Constitutional:      General: He is awake. He is not in acute distress.    Appearance: He is well-developed and well-groomed. He is not ill-appearing or toxic-appearing.  HENT:     Head: Normocephalic.     Right Ear: Hearing and external ear normal.     Left Ear: Hearing and external ear normal.  Eyes:     General: Lids are normal.     Extraocular Movements: Extraocular movements intact.     Conjunctiva/sclera: Conjunctivae normal.  Neck:     Thyroid: No thyromegaly.     Vascular: No carotid bruit.  Cardiovascular:     Rate and Rhythm: Normal rate and regular rhythm.     Heart sounds: Normal heart sounds. No murmur heard.    No gallop.  Pulmonary:     Effort: No accessory muscle usage or respiratory distress.     Breath sounds: Normal breath sounds.  Abdominal:     General: Bowel sounds are normal. There is no distension.     Palpations: Abdomen is soft.     Tenderness: There is no abdominal tenderness.  Musculoskeletal:     Cervical back:  Full passive range of motion without pain.     Right lower leg: No edema.     Left lower leg: No edema.  Lymphadenopathy:     Cervical: No cervical adenopathy.  Skin:    General: Skin is warm.     Capillary Refill: Capillary refill takes less than 2 seconds.       Neurological:     Mental Status: He is alert and oriented to person, place, and time.     Deep Tendon Reflexes: Reflexes are normal and symmetric.     Reflex Scores:      Brachioradialis reflexes are 2+ on the right side and 2+ on the left side.      Patellar reflexes are 2+ on the right side and 2+ on the left side. Psychiatric:        Attention and Perception: Attention normal.        Mood and Affect: Mood normal.        Speech: Speech normal.        Behavior: Behavior normal. Behavior is cooperative.        Thought Content: Thought content normal.     Results  for orders placed or performed in visit on 09/12/22  CBC with Differential/Platelet   Collection Time: 09/12/22  3:36 PM  Result Value Ref Range   WBC 7.0 3.4 - 10.8 x10E3/uL   RBC 3.92 (L) 4.14 - 5.80 x10E6/uL   Hemoglobin 12.0 (L) 13.0 - 17.7 g/dL   Hematocrit 16.1 (L) 09.6 - 51.0 %   MCV 92 79 - 97 fL   MCH 30.6 26.6 - 33.0 pg   MCHC 33.4 31.5 - 35.7 g/dL   RDW 04.5 40.9 - 81.1 %   Platelets 250 150 - 450 x10E3/uL   Neutrophils 57 Not Estab. %   Lymphs 34 Not Estab. %   Monocytes 6 Not Estab. %   Eos 2 Not Estab. %   Basos 1 Not Estab. %   Neutrophils Absolute 4.0 1.4 - 7.0 x10E3/uL   Lymphocytes Absolute 2.4 0.7 - 3.1 x10E3/uL   Monocytes Absolute 0.4 0.1 - 0.9 x10E3/uL   EOS (ABSOLUTE) 0.1 0.0 - 0.4 x10E3/uL   Basophils Absolute 0.1 0.0 - 0.2 x10E3/uL   Immature Granulocytes 0 Not Estab. %   Immature Grans (Abs) 0.0 0.0 - 0.1 x10E3/uL  Comprehensive metabolic panel   Collection Time: 09/12/22  3:36 PM  Result Value Ref Range   Glucose 95 70 - 99 mg/dL   BUN 23 8 - 27 mg/dL   Creatinine, Ser 9.14 0.76 - 1.27 mg/dL   eGFR 89 >78 GN/FAO/1.30   BUN/Creatinine Ratio 27 (H) 10 - 24   Sodium 140 134 - 144 mmol/L   Potassium 4.1 3.5 - 5.2 mmol/L   Chloride 103 96 - 106 mmol/L   CO2 24 20 - 29 mmol/L   Calcium 9.6 8.6 - 10.2 mg/dL   Total Protein 6.8 6.0 - 8.5 g/dL   Albumin 4.4 3.8 - 4.8 g/dL   Globulin, Total 2.4 1.5 - 4.5 g/dL   Bilirubin Total 0.2 0.0 - 1.2 mg/dL   Alkaline Phosphatase 61 44 - 121 IU/L   AST 30 0 - 40 IU/L   ALT 16 0 - 44 IU/L  Lipid Panel w/o Chol/HDL Ratio   Collection Time: 09/12/22  3:36 PM  Result Value Ref Range   Cholesterol, Total 150 100 - 199 mg/dL   Triglycerides 88 0 - 149 mg/dL   HDL 63 >86 mg/dL  VLDL Cholesterol Cal 16 5 - 40 mg/dL   LDL Chol Calc (NIH) 71 0 - 99 mg/dL  TSH   Collection Time: 09/12/22  3:36 PM  Result Value Ref Range   TSH 1.870 0.450 - 4.500 uIU/mL  PSA   Collection Time: 09/12/22  3:36 PM  Result Value Ref  Range   Prostate Specific Ag, Serum 1.4 0.0 - 4.0 ng/mL      Assessment & Plan:   Problem List Items Addressed This Visit       Other   Hematoma - Primary   To posterior right leg.  He requested area be drained.  Small puncture with 18G needle to area, moderate sang drainage removed.  Area cleansed prior and afterwards with betadine and alcohol wipes.  Place bandage and triple abx over area.  Decreased in size at this time.  Recommend he place warm compresses to area and monitor.  Educated him on finding.  If any s/s infection present will start abx therapy.          Follow up plan: Return if symptoms worsen or fail to improve.

## 2023-05-10 NOTE — Assessment & Plan Note (Addendum)
 To posterior right leg.  He requested area be drained.  Small puncture with 18G needle to area, moderate sang drainage removed.  Area cleansed prior and afterwards with betadine and alcohol wipes.  Place bandage and triple abx over area.  Decreased in size at this time.  Recommend he place warm compresses to area and monitor.  Educated him on finding.  If any s/s infection present will start abx therapy.

## 2023-08-09 ENCOUNTER — Telehealth: Payer: Self-pay | Admitting: Nurse Practitioner

## 2023-08-09 NOTE — Telephone Encounter (Signed)
 Copied from CRM [id number]. Topic: Medicare AWV >> Aug 09, 2023 10:02 AM Juliana Ocean wrote: Reason for CRM: LVM 08/09/2023 to schedule AWV. Please schedule Virtual or Telehealth visits ONLY  Rosalee Collins; Care Guide Ambulatory Clinical Support [place] [facility] Direct Dial: [phone]

## 2023-08-22 DIAGNOSIS — D485 Neoplasm of uncertain behavior of skin: Secondary | ICD-10-CM | POA: Diagnosis not present

## 2023-08-22 DIAGNOSIS — D2261 Melanocytic nevi of right upper limb, including shoulder: Secondary | ICD-10-CM | POA: Diagnosis not present

## 2023-08-22 DIAGNOSIS — L570 Actinic keratosis: Secondary | ICD-10-CM | POA: Diagnosis not present

## 2023-08-22 DIAGNOSIS — D2272 Melanocytic nevi of left lower limb, including hip: Secondary | ICD-10-CM | POA: Diagnosis not present

## 2023-08-22 DIAGNOSIS — C44519 Basal cell carcinoma of skin of other part of trunk: Secondary | ICD-10-CM | POA: Diagnosis not present

## 2023-08-22 DIAGNOSIS — Z85828 Personal history of other malignant neoplasm of skin: Secondary | ICD-10-CM | POA: Diagnosis not present

## 2023-08-22 DIAGNOSIS — C44612 Basal cell carcinoma of skin of right upper limb, including shoulder: Secondary | ICD-10-CM | POA: Diagnosis not present

## 2023-08-22 DIAGNOSIS — D225 Melanocytic nevi of trunk: Secondary | ICD-10-CM | POA: Diagnosis not present

## 2023-08-22 DIAGNOSIS — D2262 Melanocytic nevi of left upper limb, including shoulder: Secondary | ICD-10-CM | POA: Diagnosis not present

## 2023-09-09 DIAGNOSIS — D649 Anemia, unspecified: Secondary | ICD-10-CM | POA: Insufficient documentation

## 2023-09-13 ENCOUNTER — Ambulatory Visit (INDEPENDENT_AMBULATORY_CARE_PROVIDER_SITE_OTHER): Payer: Self-pay | Admitting: Nurse Practitioner

## 2023-09-13 ENCOUNTER — Encounter: Payer: Self-pay | Admitting: Nurse Practitioner

## 2023-09-13 VITALS — BP 130/68 | HR 51 | Temp 97.8°F | Ht 65.7 in | Wt 129.4 lb

## 2023-09-13 DIAGNOSIS — R3911 Hesitancy of micturition: Secondary | ICD-10-CM | POA: Diagnosis not present

## 2023-09-13 DIAGNOSIS — D649 Anemia, unspecified: Secondary | ICD-10-CM

## 2023-09-13 DIAGNOSIS — I700 Atherosclerosis of aorta: Secondary | ICD-10-CM

## 2023-09-13 DIAGNOSIS — Z0000 Encounter for general adult medical examination without abnormal findings: Secondary | ICD-10-CM | POA: Diagnosis not present

## 2023-09-13 DIAGNOSIS — N401 Enlarged prostate with lower urinary tract symptoms: Secondary | ICD-10-CM

## 2023-09-13 MED ORDER — FINASTERIDE 5 MG PO TABS: 5.0000 mg | ORAL_TABLET | Freq: Every morning | ORAL | 4 refills | Status: AC

## 2023-09-13 MED ORDER — CICLOPIROX 8 % EX SOLN: Freq: Every day | CUTANEOUS | 0 refills | Status: DC

## 2023-09-13 NOTE — Assessment & Plan Note (Signed)
 At baseline, is endurance athlete.  Recheck today, along with iron.

## 2023-09-13 NOTE — Progress Notes (Signed)
 BP 130/68   Pulse (!) 51   Temp 97.8 F (36.6 C) (Oral)   Ht 5' 5.7 (1.669 m)   Wt 129 lb 6.4 oz (58.7 kg)   SpO2 99%   BMI 21.08 kg/m    Subjective:    Patient ID: Jose [name], male    DOB: 27-Jun-1945, 78 y.o.   MRN: [id number]  HPI: Jose [name] is a 78 y.o. male presenting on 09/13/2023 for annual physical. Current medical complaints include:none  He currently lives with: self Interim Problems from his last visit: no   Continues to cycle daily, long distances.  Has low hemoglobin at baseline on labs.  BPH Continues on Finasteride  5 MG at bedtime.  Aortic atherosclerosis noted on past imaging. BPH status: controlled Satisfied with current treatment?: yes Medication side effects: no Medication compliance: good compliance Duration: chronic Nocturia: 1-2/night Urinary frequency: yes due to drinking a lot of coffee Incomplete voiding: no Urgency: no Weak urinary stream: no Straining to start stream: no Dysuria: no Onset: gradual Severity: mild The 10-year ASCVD risk score (Arnett DK, et al., 2019) is: 26.8%   Values used to calculate the score:     Age: 83 years     Clincally relevant sex: Male     Is Non-Hispanic African American: No     Diabetic: No     Tobacco smoker: No     Systolic Blood Pressure: 130 mmHg     Is BP treated: No     HDL Cholesterol: 63 mg/dL     Total Cholesterol: 150 mg/dL  Functional Status Survey: Is the patient deaf or have difficulty hearing?: No Does the patient have difficulty seeing, even when wearing glasses/contacts?: No Does the patient have difficulty concentrating, remembering, or making decisions?: No Does the patient have difficulty walking or climbing stairs?: No Does the patient have difficulty dressing or bathing?: No Does the patient have difficulty doing errands alone such as visiting a doctor's office or shopping?: No  FALL RISK:    09/13/2023    3:45 PM 05/10/2023   10:42 AM 09/12/2022    3:28 PM 09/12/2022     3:22 PM 09/10/2021    2:52 PM  Fall Risk   Falls in the past year? 0 0 0 0 0  Number falls in past yr: 0 0 0 0 0  Injury with Fall? 0 0 0 0 0  Risk for fall due to : No Fall Risks No Fall Risks No Fall Risks No Fall Risks No Fall Risks  Follow up Falls evaluation completed Falls evaluation completed Falls evaluation completed Falls prevention discussed Falls evaluation completed      Data saved with a previous flowsheet row definition   Depression Screen    09/13/2023    3:45 PM 05/10/2023   10:43 AM 09/12/2022    3:28 PM 09/12/2022    3:23 PM 09/10/2021    2:52 PM  Depression screen PHQ 2/9  Decreased Interest 0 0 0 0 0  Down, Depressed, Hopeless 0 0 0 0 0  PHQ - 2 Score 0 0 0 0 0  Altered sleeping 0 0 0 0 0  Tired, decreased energy 0 0 0 0 0  Change in appetite 0 0 0 0 0  Feeling bad or failure about yourself  0 0 0 0 0  Trouble concentrating 0 0 0 0 0  Moving slowly or fidgety/restless 0 0 0 0 0  Suicidal thoughts 0 0 0 0 0  PHQ-9  Score 0 0 0 0 0  Difficult doing work/chores Not difficult at all Not difficult at all Not difficult at all Not difficult at all Not difficult at all      09/13/2023    3:45 PM 05/10/2023   10:43 AM 09/12/2022    3:28 PM 09/12/2022    3:23 PM  GAD 7 : Generalized Anxiety Score  Nervous, Anxious, on Edge 0 0 0 0  Control/stop worrying 0 0 0 0  Worry too much - different things 0 0 0 0  Trouble relaxing 0 0 0 0  Restless 0 0 0 0  Easily annoyed or irritable 0 0 0 0  Afraid - awful might happen 0 0 0 0  Total GAD 7 Score 0 0 0 0  Anxiety Difficulty Not difficult at all Not difficult at all Not difficult at all Not difficult at all   Advanced Directives <no information>  Past Medical History:  Past Medical History:  Diagnosis Date   Asthma    as a child   Basal cell carcinoma (BCC) of scalp 08/2015   Collapsed lung 02/2017   Family history of adverse reaction to anesthesia    brother-PT UNSURE BUT WILL FIND OUT REACTION   GERD (gastroesophageal  reflux disease)    occ   Osteoporosis     Surgical History:  Past Surgical History:  Procedure Laterality Date   broken collar bone  2002/2007   FRACTURE SURGERY  2007   broken left thigh bone   FRACTURE SURGERY  2007   broken left upper arm   HERNIA REPAIR     LEG SURGERY     ORIF CALCANEOUS FRACTURE Right 12/29/2017   Procedure: OPEN REDUCTION INTERNAL FIXATION (ORIF) CALCANEOUS FRACTURE;  Surgeon: Ashley Soulier, DPM;  Location: [facility] ORS;  Service: Podiatry;  Laterality: Right;    Medications:  Current Outpatient Medications on File Prior to Visit  Medication Sig   aspirin 81 MG chewable tablet Chew 81 mg by mouth daily.   calcium-vitamin D (OSCAL WITH D) 500-200 MG-UNIT tablet Take 1 tablet by mouth.   Multiple Vitamin (MULTIVITAMIN) tablet Take 1 tablet by mouth daily.   No current facility-administered medications on file prior to visit.    Allergies:  No Known Allergies  Social History:  Social History   Socioeconomic History   Marital status: Widowed    Spouse name: Not on file   Number of children: Not on file   Years of education: 2 associate degreee    Highest education level: Associate degree: academic program  Occupational History   Occupation: retired   Tobacco Use   Smoking status: Former    Current packs/day: 0.00    Average packs/day: 1 pack/day for 4.0 years (4.0 ttl pk-yrs)    Types: Cigarettes    Start date: 02/10/1965    Quit date: 02/10/1969    Years since quitting: 54.6   Smokeless tobacco: Never  Vaping Use   Vaping status: Never Used  Substance and Sexual Activity   Alcohol use: No   Drug use: No   Sexual activity: Not Currently  Other Topics Concern   Not on file  Social History Narrative   Not on file   Social Drivers of Health   Financial Resource Strain: Low Risk  (09/13/2023)   Overall Financial Resource Strain (CARDIA)    Difficulty of Paying Living Expenses: Not hard at all  Food Insecurity: No Food Insecurity (09/13/2023)    Hunger Vital Sign  Worried About Programme researcher, broadcasting/film/video in the Last Year: Never true    Ran Out of Food in the Last Year: Never true  Transportation Needs: No Transportation Needs (09/13/2023)   PRAPARE - Administrator, Civil Service (Medical): No    Lack of Transportation (Non-Medical): No  Physical Activity: Sufficiently Active (09/13/2023)   Exercise Vital Sign    Days of Exercise per Week: 7 days    Minutes of Exercise per Session: 150+ min  Stress: No Stress Concern Present (09/13/2023)   [employer] of Occupational Health - Occupational Stress Questionnaire    Feeling of Stress: Not at all  Social Connections: Moderately Isolated (09/13/2023)   Social Connection and Isolation Panel    Frequency of Communication with Friends and Family: More than three times a week    Frequency of Social Gatherings with Friends and Family: More than three times a week    Attends Religious Services: Never    Database administrator or Organizations: Yes    Attends Engineer, structural: More than 4 times per year    Marital Status: Widowed  Intimate Partner Violence: Not At Risk (09/13/2023)   Humiliation, Afraid, Rape, and Kick questionnaire    Fear of Current or Ex-Partner: No    Emotionally Abused: No    Physically Abused: No    Sexually Abused: No   Social History   Tobacco Use  Smoking Status Former   Current packs/day: 0.00   Average packs/day: 1 pack/day for 4.0 years (4.0 ttl pk-yrs)   Types: Cigarettes   Start date: 02/10/1965   Quit date: 02/10/1969   Years since quitting: 54.6  Smokeless Tobacco Never   Social History   Substance and Sexual Activity  Alcohol Use No    Family History:  Family History  Problem Relation Age of Onset   Congestive Heart Failure Mother    Cancer Mother    Obesity Brother    Cancer Paternal Grandfather        leukemia   Past medical history, surgical history, medications, allergies, family history and social history  reviewed with patient today and changes made to appropriate areas of the chart.   Review of Systems - negative All other ROS negative except what is listed above and in the HPI.      Objective:    BP 130/68   Pulse (!) 51   Temp 97.8 F (36.6 C) (Oral)   Ht 5' 5.7 (1.669 m)   Wt 129 lb 6.4 oz (58.7 kg)   SpO2 99%   BMI 21.08 kg/m   Wt Readings from Last 3 Encounters:  09/13/23 129 lb 6.4 oz (58.7 kg)  05/10/23 150 lb 9.6 oz (68.3 kg)  09/12/22 134 lb 6.4 oz (61 kg)    Physical Exam Vitals and nursing note reviewed.  Constitutional:      General: He is awake. He is not in acute distress.    Appearance: He is well-developed and well-groomed. He is not ill-appearing.  HENT:     Head: Normocephalic and atraumatic.     Right Ear: Hearing, tympanic membrane, ear canal and external ear normal. No drainage.     Left Ear: Hearing, tympanic membrane, ear canal and external ear normal. No drainage.     Nose: Nose normal.     Mouth/Throat:     Pharynx: Uvula midline.  Eyes:     General: Lids are normal.        Right eye: No  discharge.        Left eye: No discharge.     Extraocular Movements: Extraocular movements intact.     Conjunctiva/sclera: Conjunctivae normal.     Pupils: Pupils are equal, round, and reactive to light.     Visual Fields: Right eye visual fields normal and left eye visual fields normal.  Neck:     Thyroid : No thyromegaly.     Vascular: No carotid bruit or JVD.     Trachea: Trachea normal.  Cardiovascular:     Rate and Rhythm: Regular rhythm. Bradycardia present.     Heart sounds: Normal heart sounds, S1 normal and S2 normal. No murmur heard.    No gallop.  Pulmonary:     Effort: Pulmonary effort is normal. No accessory muscle usage or respiratory distress.     Breath sounds: Normal breath sounds.  Abdominal:     General: Bowel sounds are normal.     Palpations: Abdomen is soft. There is no hepatomegaly or splenomegaly.     Tenderness: There is no  abdominal tenderness.  Genitourinary:    Penis: Normal.      Testes: Normal.     Prostate: Normal.     Rectum: Normal.     Comments: Refused chaperone Musculoskeletal:        General: Normal range of motion.     Cervical back: Normal range of motion and neck supple.     Right lower leg: No edema.     Left lower leg: No edema.  Lymphadenopathy:     Head:     Right side of head: No submental, submandibular, tonsillar, preauricular or posterior auricular adenopathy.     Left side of head: No submental, submandibular, tonsillar, preauricular or posterior auricular adenopathy.     Cervical: No cervical adenopathy.  Skin:    General: Skin is warm and dry.     Capillary Refill: Capillary refill takes less than 2 seconds.     Findings: No rash.  Neurological:     Mental Status: He is alert and oriented to person, place, and time.     Gait: Gait is intact.     Deep Tendon Reflexes: Reflexes are normal and symmetric.     Reflex Scores:      Brachioradialis reflexes are 2+ on the right side and 2+ on the left side.      Patellar reflexes are 2+ on the right side and 2+ on the left side. Psychiatric:        Attention and Perception: Attention normal.        Mood and Affect: Mood normal.        Speech: Speech normal.        Behavior: Behavior normal. Behavior is cooperative.        Thought Content: Thought content normal.        Cognition and Memory: Cognition normal.        Judgment: Judgment normal.       09/13/2023    3:38 PM 09/12/2022    3:35 PM 07/16/2018    9:17 AM  6CIT Screen  What Year? 0 points 0 points 0 points  What month? 0 points 0 points 0 points  What time? 0 points 0 points 0 points  Count back from 20 0 points 0 points 0 points  Months in reverse 0 points 0 points 0 points  Repeat phrase 0 points 0 points 0 points  Total Score 0 points 0 points 0 points   Results for  orders placed or performed in visit on 09/12/22  CBC with Differential/Platelet   Collection  Time: 09/12/22  3:36 PM  Result Value Ref Range   WBC 7.0 3.4 - 10.8 x10E3/uL   RBC 3.92 (L) 4.14 - 5.80 x10E6/uL   Hemoglobin 12.0 (L) 13.0 - 17.7 g/dL   Hematocrit 64.0 (L) 62.4 - 51.0 %   MCV 92 79 - 97 fL   MCH 30.6 26.6 - 33.0 pg   MCHC 33.4 31.5 - 35.7 g/dL   RDW 88.0 88.3 - 84.5 %   Platelets 250 150 - 450 x10E3/uL   Neutrophils 57 Not Estab. %   Lymphs 34 Not Estab. %   Monocytes 6 Not Estab. %   Eos 2 Not Estab. %   Basos 1 Not Estab. %   Neutrophils Absolute 4.0 1.4 - 7.0 x10E3/uL   Lymphocytes Absolute 2.4 0.7 - 3.1 x10E3/uL   Monocytes Absolute 0.4 0.1 - 0.9 x10E3/uL   EOS (ABSOLUTE) 0.1 0.0 - 0.4 x10E3/uL   Basophils Absolute 0.1 0.0 - 0.2 x10E3/uL   Immature Granulocytes 0 Not Estab. %   Immature Grans (Abs) 0.0 0.0 - 0.1 x10E3/uL  Comprehensive metabolic panel   Collection Time: 09/12/22  3:36 PM  Result Value Ref Range   Glucose 95 70 - 99 mg/dL   BUN 23 8 - 27 mg/dL   Creatinine, Ser 9.14 0.76 - 1.27 mg/dL   eGFR 89 >40 fO/fpw/8.26   BUN/Creatinine Ratio 27 (H) 10 - 24   Sodium 140 134 - 144 mmol/L   Potassium 4.1 3.5 - 5.2 mmol/L   Chloride 103 96 - 106 mmol/L   CO2 24 20 - 29 mmol/L   Calcium 9.6 8.6 - 10.2 mg/dL   Total Protein 6.8 6.0 - 8.5 g/dL   Albumin 4.4 3.8 - 4.8 g/dL   Globulin, Total 2.4 1.5 - 4.5 g/dL   Bilirubin Total 0.2 0.0 - 1.2 mg/dL   Alkaline Phosphatase 61 44 - 121 IU/L   AST 30 0 - 40 IU/L   ALT 16 0 - 44 IU/L  Lipid Panel w/o Chol/HDL Ratio   Collection Time: 09/12/22  3:36 PM  Result Value Ref Range   Cholesterol, Total 150 100 - 199 mg/dL   Triglycerides 88 0 - 149 mg/dL   HDL 63 >60 mg/dL   VLDL Cholesterol Cal 16 5 - 40 mg/dL   LDL Chol Calc (NIH) 71 0 - 99 mg/dL  TSH   Collection Time: 09/12/22  3:36 PM  Result Value Ref Range   TSH 1.870 0.450 - 4.500 uIU/mL  PSA   Collection Time: 09/12/22  3:36 PM  Result Value Ref Range   Prostate Specific Ag, Serum 1.4 0.0 - 4.0 ng/mL      Assessment & Plan:   Problem  List Items Addressed This Visit       Cardiovascular and Mediastinum   Aortic atherosclerosis (HCC)   Noted on imaging 03/01/2017, reviewed with patient and educated. Continue Baby ASA 81 MG daily for prevention, does not wish to start statin.      Relevant Orders   Comprehensive metabolic panel with GFR   Lipid Panel w/o Chol/HDL Ratio     Genitourinary   BPH (benign prostatic hyperplasia)   Chronic, stable.  Continue current medication regimen and adjust as needed.  Would like PSA checked today and annually, is aware of guideline recommendations and wishes to continue blood checks.      Relevant Medications   finasteride  (PROSCAR ) 5  MG tablet   Other Relevant Orders   PSA     Other   Low hemoglobin   At baseline, is endurance athlete.  Recheck today, along with iron.      Relevant Orders   CBC with Differential/Platelet   Vitamin B12   Ferritin   Iron   Other Visit Diagnoses       Encounter for Medicare annual wellness exam    -  Primary   Performed today     Encounter for annual physical exam       Annual physical today with health maintenance reviewed.   Relevant Orders   CBC with Differential/Platelet   TSH       Discussed aspirin prophylaxis for myocardial infarction prevention and decision was it was not indicated  LABORATORY TESTING:  Health maintenance labs ordered today as discussed above.   The natural history of prostate cancer and ongoing controversy regarding screening and potential treatment outcomes of prostate cancer has been discussed with the patient. The meaning of a false positive PSA and a false negative PSA has been discussed. He indicates understanding of the limitations of this screening test and wishes to proceed with screening PSA testing.  IMMUNIZATIONS:   - Tdap: Tetanus vaccination status reviewed: last tetanus booster within 10 years. - Influenza: refuses - Pneumovax: Up to date - Prevnar: Up to date - Zostavax vaccine: Up To  Date  SCREENING: - Colonoscopy: Up to date  Discussed with patient purpose of the colonoscopy is to detect colon cancer at curable precancerous or early stages   - AAA Screening: Not applicable  -Hearing Test: Not applicable  -Spirometry: Not applicable   PATIENT COUNSELING:    Sexuality: Discussed sexually transmitted diseases, partner selection, use of condoms, avoidance of unintended pregnancy  and contraceptive alternatives.   Advised to avoid cigarette smoking.  I discussed with the patient that most people either abstain from alcohol or drink within safe limits (<=14/week and <=4 drinks/occasion for males, <=7/weeks and <= 3 drinks/occasion for females) and that the risk for alcohol disorders and other health effects rises proportionally with the number of drinks per week and how often a drinker exceeds daily limits.  Discussed cessation/primary prevention of drug use and availability of treatment for abuse.   Diet: Encouraged to adjust caloric intake to maintain  or achieve ideal body weight, to reduce intake of dietary saturated fat and total fat, to limit sodium intake by avoiding high sodium foods and not adding table salt, and to maintain adequate dietary potassium and calcium preferably from fresh fruits, vegetables, and low-fat dairy products.    Stressed the importance of regular exercise  Injury prevention: Discussed safety belts, safety helmets, smoke detector, smoking near bedding or upholstery.   Dental health: Discussed importance of regular tooth brushing, flossing, and dental visits.   Follow up plan: NEXT PREVENTATIVE PHYSICAL DUE IN 1 YEAR. Return in about 1 year (around 09/12/2024) for Annual Physical.

## 2023-09-13 NOTE — Patient Instructions (Signed)

## 2023-09-13 NOTE — Assessment & Plan Note (Signed)
Chronic, stable.  Continue current medication regimen and adjust as needed.  Would like PSA checked today and annually, is aware of guideline recommendations and wishes to continue blood checks. 

## 2023-09-13 NOTE — Assessment & Plan Note (Signed)
Noted on imaging 03/01/2017, reviewed with patient and educated. Continue Baby ASA 81 MG daily for prevention, does not wish to start statin. 

## 2023-09-13 NOTE — Progress Notes (Signed)
 Subjective:   Jose [name] is a 78 y.o. male who presents for Medicare Annual/Subsequent preventive examination.  Visit Complete: In person  Patient Medicare AWV questionnaire was completed by the patient on 09/13/23; I have confirmed that all information answered by patient is correct and no changes since this date.  Cardiac Risk Factors include: advanced age (>63men, >18 women);hypertension;male gender     Objective:    Today's Vitals   09/13/23 1519  BP: 130/68  Pulse: (!) 51  Temp: 97.8 F (36.6 C)  TempSrc: Oral  SpO2: 99%  Weight: 129 lb 6.4 oz (58.7 kg)  Height: 5' 5.7 (1.669 m)  PainSc: 0-No pain   Body mass index is 21.08 kg/m.     09/13/2023    3:37 PM 05/10/2021    9:52 AM 07/16/2018    9:15 AM 12/29/2017    4:00 PM 12/27/2017   12:27 PM 03/03/2017    4:00 AM 02/28/2017   12:14 AM  Advanced Directives  Does Patient Have a Medical Advance Directive? Yes Yes No No  Yes  Yes  Yes   Type of Estate agent of [place];Living will Healthcare Power of [place];Living will Living will;Healthcare Power of [place] Power of [place];Living will  Does patient want to make changes to medical advance directive? Yes (Inpatient - patient defers changing a medical advance directive and declines information at this time)   No - Patient declined    No - Patient declined   Copy of Healthcare Power of Attorney in Chart?  No - copy requested No - copy requested     No - copy requested   Would patient like information on creating a medical advance directive?  No - Patient declined  No - Patient declined         Data saved with a previous flowsheet row definition    Current Medications (verified) Outpatient Encounter Medications as of 09/13/2023  Medication Sig   aspirin 81 MG chewable tablet Chew 81 mg by mouth daily.   calcium-vitamin D (OSCAL WITH D) 500-200 MG-UNIT tablet Take 1 tablet by mouth.   finasteride  (PROSCAR ) 5 MG tablet Take 1  tablet (5 mg total) by mouth every morning.   Multiple Vitamin (MULTIVITAMIN) tablet Take 1 tablet by mouth daily.   No facility-administered encounter medications on file as of 09/13/2023.    Allergies (verified) Patient has no known allergies.   History: Past Medical History:  Diagnosis Date   Asthma    as a child   Basal cell carcinoma (BCC) of scalp 08/2015   Collapsed lung 02/2017   Family history of adverse reaction to anesthesia    brother-PT UNSURE BUT WILL FIND OUT REACTION   GERD (gastroesophageal reflux disease)    occ   Osteoporosis    Past Surgical History:  Procedure Laterality Date   broken collar bone  2002/2007   FRACTURE SURGERY  2007   broken left thigh bone   FRACTURE SURGERY  2007   broken left upper arm   HERNIA REPAIR     LEG SURGERY     ORIF CALCANEOUS FRACTURE Right 12/29/2017   Procedure: OPEN REDUCTION INTERNAL FIXATION (ORIF) CALCANEOUS FRACTURE;  Surgeon: Ashley Soulier, DPM;  Location: [facility] ORS;  Service: Podiatry;  Laterality: Right;   Family History  Problem Relation Age of Onset   Congestive Heart Failure Mother    Cancer Mother    Obesity Brother    Cancer Paternal Grandfather  leukemia   Social History   Socioeconomic History   Marital status: Widowed    Spouse name: Not on file   Number of children: Not on file   Years of education: 2 associate degreee    Highest education level: Associate degree: academic program  Occupational History   Occupation: retired   Tobacco Use   Smoking status: Former    Current packs/day: 0.00    Average packs/day: 1 pack/day for 4.0 years (4.0 ttl pk-yrs)    Types: Cigarettes    Start date: 02/10/1965    Quit date: 02/10/1969    Years since quitting: 54.6   Smokeless tobacco: Never  Vaping Use   Vaping status: Never Used  Substance and Sexual Activity   Alcohol use: No   Drug use: No   Sexual activity: Not Currently  Other Topics Concern   Not on file  Social History Narrative    Not on file   Social Drivers of Health   Financial Resource Strain: Low Risk  (09/13/2023)   Overall Financial Resource Strain (CARDIA)    Difficulty of Paying Living Expenses: Not hard at all  Food Insecurity: No Food Insecurity (09/13/2023)   Hunger Vital Sign    Worried About Running Out of Food in the Last Year: Never true    Ran Out of Food in the Last Year: Never true  Transportation Needs: No Transportation Needs (09/13/2023)   PRAPARE - Administrator, Civil Service (Medical): No    Lack of Transportation (Non-Medical): No  Physical Activity: Sufficiently Active (09/13/2023)   Exercise Vital Sign    Days of Exercise per Week: 7 days    Minutes of Exercise per Session: 150+ min  Stress: No Stress Concern Present (09/13/2023)   [employer] of Occupational Health - Occupational Stress Questionnaire    Feeling of Stress: Not at all  Social Connections: Moderately Isolated (09/13/2023)   Social Connection and Isolation Panel    Frequency of Communication with Friends and Family: More than three times a week    Frequency of Social Gatherings with Friends and Family: More than three times a week    Attends Religious Services: Never    Database administrator or Organizations: Yes    Attends Engineer, structural: More than 4 times per year    Marital Status: Widowed    Tobacco Counseling Counseling given: Not Answered   Clinical Intake:  Pre-visit preparation completed: Yes  Pain : No/denies pain Pain Score: 0-No pain     BMI - recorded: 21.07 Nutritional Status: BMI of 19-24  Normal Nutritional Risks: None Diabetes: No  How often do you need to have someone help you when you read instructions, pamphlets, or other written materials from your doctor or pharmacy?: 1 - Never     Information entered by :: Laymon Metro, CMA   Activities of Daily Living    09/13/2023    3:36 PM  In your present state of health, do you have any difficulty  performing the following activities:  Hearing? 0  Vision? 0  Difficulty concentrating or making decisions? 0  Walking or climbing stairs? 0  Dressing or bathing? 0  Doing errands, shopping? 0  Preparing Food and eating ? N  Using the Toilet? N  In the past six months, have you accidently leaked urine? N  Do you have problems with loss of bowel control? N  Managing your Medications? N  Managing your Finances? N  Housekeeping or managing  your Housekeeping? N    Patient Care Team: Cannady, Jolene T, NP as PCP - General (Nurse Practitioner) Bethena Andre Cherylynn DOUGLAS, PA-C as Physician Assistant (Physician Assistant) Ashley Soulier, DPM as Referring Physician (Podiatry)  Indicate any recent Medical Services you may have received from other than [facility] providers in the past year (date may be approximate).     Assessment:   This is a routine wellness examination for [facility].  Hearing/Vision screen No results found.   Goals Addressed   None    Depression Screen    09/13/2023    3:45 PM 05/10/2023   10:43 AM 09/12/2022    3:28 PM 09/12/2022    3:23 PM 09/10/2021    2:52 PM 05/10/2021   10:05 AM 09/02/2020    2:58 PM  PHQ 2/9 Scores  PHQ - 2 Score 0 0 0 0 0 0 0  PHQ- 9 Score 0 0 0 0 0      Fall Risk    09/13/2023    3:45 PM 05/10/2023   10:42 AM 09/12/2022    3:28 PM 09/12/2022    3:22 PM 09/10/2021    2:52 PM  Fall Risk   Falls in the past year? 0 0 0 0 0  Number falls in past yr: 0 0 0 0 0  Injury with Fall? 0 0 0 0 0  Risk for fall due to : No Fall Risks No Fall Risks No Fall Risks No Fall Risks No Fall Risks  Follow up Falls evaluation completed Falls evaluation completed Falls evaluation completed Falls prevention discussed Falls evaluation completed      Data saved with a previous flowsheet row definition    MEDICARE RISK AT HOME: Medicare Risk at Home Any stairs in or around the home?: Yes If so, are there any without handrails?: No Home free of loose throw rugs in walkways, pet  beds, electrical cords, etc?: No Adequate lighting in your home to reduce risk of falls?: No Life alert?: No Use of a cane, walker or w/c?: No Grab bars in the bathroom?: No Shower chair or bench in shower?: No Elevated toilet seat or a handicapped toilet?: No  TIMED UP AND GO:  Was the test performed?  Yes  Length of time to ambulate 10 feet: 3 sec Gait steady and fast without use of assistive device    Cognitive Function:        09/13/2023    3:38 PM 09/12/2022    3:35 PM 07/16/2018    9:17 AM  6CIT Screen  What Year? 0 points 0 points 0 points  What month? 0 points 0 points 0 points  What time? 0 points 0 points 0 points  Count back from 20 0 points 0 points 0 points  Months in reverse 0 points 0 points 0 points  Repeat phrase 0 points 0 points 0 points  Total Score 0 points 0 points 0 points    Immunizations Immunization History  Administered Date(s) Administered   Moderna Sars-Covid-2 Vaccination 05/02/2019, 05/30/2019, 07/24/2020   Pneumococcal Conjugate-13 07/21/2014, 01/22/2020   Pneumococcal Polysaccharide-23 06/29/2010   Td 04/15/2023   Tdap 06/29/2011, 03/07/2013, 04/15/2023   Zoster Recombinant(Shingrix ) 10/17/2019, 01/23/2020   Zoster, Live 07/16/2013    TDAP status: Up to date  Flu Vaccine status: Up to date  Pneumococcal vaccine status: Up to date  Covid-19 vaccine status: Information provided on how to obtain vaccines.   Qualifies for Shingles Vaccine? Yes   Zostavax completed Yes   Shingrix   Completed?: Yes  Screening Tests Health Maintenance  Topic Date Due   COVID-19 Vaccine (4 - 2024-25 season) 09/25/2023 (Originally 11/06/2022)   INFLUENZA VACCINE  10/06/2023   Medicare Annual Wellness (AWV)  09/12/2024   DTaP/Tdap/Td (5 - Td or Tdap) 04/14/2033   Pneumococcal Vaccine: 50+ Years  Completed   Hepatitis C Screening  Completed   Zoster Vaccines- Shingrix   Completed   Hepatitis B Vaccines  Aged Out   HPV VACCINES  Aged Out   Meningococcal B  Vaccine  Aged Out   Colonoscopy  Discontinued    Health Maintenance  There are no preventive care reminders to display for this patient.   Colorectal cancer screening: Type of screening: Colonoscopy. Completed 2013. Repeat every 10 years. Patient declined to schedule.   Lung Cancer Screening: (Low Dose CT Chest recommended if Age 104-80 years, 20 pack-year currently smoking OR have quit w/in 15years.) does not qualify.   Lung Cancer Screening Referral: N/A  Additional Screening:  Hepatitis C Screening: does qualify; Completed 07/04/2016  Vision Screening: Recommended annual ophthalmology exams for early detection of glaucoma and other disorders of the eye. Is the patient up to date with their annual eye exam?  Yes  If pt is not established with a provider, would they like to be referred to a provider to establish care? N/A.   Dental Screening: Recommended annual dental exams for proper oral hygiene  Diabetic Foot Exam: patient is not diabetic  Community Resource Referral / Chronic Care Management: CRR required this visit?  No   CCM required this visit?  No     Plan:     I have personally reviewed and noted the following in the patient's chart:   Medical and social history Use of alcohol, tobacco or illicit drugs  Current medications and supplements including opioid prescriptions. Patient is not currently taking opioid prescriptions. Functional ability and status Nutritional status Physical activity Advanced directives List of other physicians Hospitalizations, surgeries, and ER visits in previous 12 months Vitals Screenings to include cognitive, depression, and falls Referrals and appointments  In addition, I have reviewed and discussed with patient certain preventive protocols, quality metrics, and best practice recommendations. A written personalized care plan for preventive services as well as general preventive health recommendations were provided to patient.      Laymon LOISE Metro, CMA   09/13/2023   After Visit Summary: (In Person-Printed) AVS printed and given to the patient

## 2023-09-14 ENCOUNTER — Ambulatory Visit: Payer: Self-pay | Admitting: Nurse Practitioner

## 2023-09-14 LAB — COMPREHENSIVE METABOLIC PANEL WITH GFR
ALT: 17 IU/L (ref 0–44)
AST: 28 IU/L (ref 0–40)
Albumin: 4.4 g/dL (ref 3.8–4.8)
Alkaline Phosphatase: 58 IU/L (ref 44–121)
BUN/Creatinine Ratio: 29 — ABNORMAL HIGH (ref 10–24)
BUN: 23 mg/dL (ref 8–27)
Bilirubin Total: 0.2 mg/dL (ref 0.0–1.2)
CO2: 21 mmol/L (ref 20–29)
Calcium: 9.2 mg/dL (ref 8.6–10.2)
Chloride: 104 mmol/L (ref 96–106)
Creatinine, Ser: 0.8 mg/dL (ref 0.76–1.27)
Globulin, Total: 1.9 g/dL (ref 1.5–4.5)
Glucose: 94 mg/dL (ref 70–99)
Potassium: 4 mmol/L (ref 3.5–5.2)
Sodium: 140 mmol/L (ref 134–144)
Total Protein: 6.3 g/dL (ref 6.0–8.5)
eGFR: 91 mL/min/1.73 (ref >59)

## 2023-09-14 LAB — CBC WITH DIFFERENTIAL/PLATELET
Basophils Absolute: 0.1 x10E3/uL (ref 0.0–0.2)
Basos: 1 %
EOS (ABSOLUTE): 0.1 x10E3/uL (ref 0.0–0.4)
Eos: 2 %
Hematocrit: 34.9 % — ABNORMAL LOW (ref 37.5–51.0)
Hemoglobin: 11.3 g/dL — ABNORMAL LOW (ref 13.0–17.7)
Immature Grans (Abs): 0 x10E3/uL (ref 0.0–0.1)
Immature Granulocytes: 0 %
Lymphocytes Absolute: 2.3 x10E3/uL (ref 0.7–3.1)
Lymphs: 36 %
MCH: 30.3 pg (ref 26.6–33.0)
MCHC: 32.4 g/dL (ref 31.5–35.7)
MCV: 94 fL (ref 79–97)
Monocytes Absolute: 0.4 x10E3/uL (ref 0.1–0.9)
Monocytes: 7 %
Neutrophils Absolute: 3.5 x10E3/uL (ref 1.4–7.0)
Neutrophils: 54 %
Platelets: 228 x10E3/uL (ref 150–450)
RBC: 3.73 x10E6/uL — ABNORMAL LOW (ref 4.14–5.80)
RDW: 12.6 % (ref 11.6–15.4)
WBC: 6.4 x10E3/uL (ref 3.4–10.8)

## 2023-09-14 LAB — LIPID PANEL W/O CHOL/HDL RATIO
Cholesterol, Total: 137 mg/dL (ref 100–199)
HDL: 56 mg/dL (ref >39)
LDL Chol Calc (NIH): 69 mg/dL (ref 0–99)
Triglycerides: 56 mg/dL (ref 0–149)
VLDL Cholesterol Cal: 12 mg/dL (ref 5–40)

## 2023-09-14 LAB — PSA: Prostate Specific Ag, Serum: 1.2 ng/mL (ref 0.0–4.0)

## 2023-09-14 LAB — VITAMIN B12: Vitamin B-12: 734 pg/mL (ref 232–1245)

## 2023-09-14 LAB — FERRITIN: Ferritin: 71 ng/mL (ref 30–400)

## 2023-09-14 LAB — TSH: TSH: 1.75 u[IU]/mL (ref 0.450–4.500)

## 2023-09-14 LAB — IRON: Iron: 44 ug/dL (ref 38–169)

## 2023-09-14 NOTE — Progress Notes (Signed)
 Contacted via MyChart  Good morning Jose [name], your labs have returned and overall are stable.  CBC continues to show slightly low hemoglobin and hematocrit + iron level is on lower side normal.  With your active lifestyle I do recommend increasing iron rich foods a bit more and even a supplement a few days a week would be beneficial. Any questions? Keep being amazing!!  Thank you for allowing me to participate in your care.  I appreciate you. Kindest regards, Chantelle Verdi

## 2023-10-12 DIAGNOSIS — L905 Scar conditions and fibrosis of skin: Secondary | ICD-10-CM | POA: Diagnosis not present

## 2023-10-12 DIAGNOSIS — C44519 Basal cell carcinoma of skin of other part of trunk: Secondary | ICD-10-CM | POA: Diagnosis not present

## 2023-10-31 DIAGNOSIS — C4441 Basal cell carcinoma of skin of scalp and neck: Secondary | ICD-10-CM | POA: Diagnosis not present

## 2023-10-31 DIAGNOSIS — C44519 Basal cell carcinoma of skin of other part of trunk: Secondary | ICD-10-CM | POA: Diagnosis not present

## 2023-10-31 DIAGNOSIS — C44612 Basal cell carcinoma of skin of right upper limb, including shoulder: Secondary | ICD-10-CM | POA: Diagnosis not present

## 2023-11-15 DIAGNOSIS — S63653A Sprain of metacarpophalangeal joint of left middle finger, initial encounter: Secondary | ICD-10-CM | POA: Diagnosis not present

## 2023-11-27 DIAGNOSIS — S63653A Sprain of metacarpophalangeal joint of left middle finger, initial encounter: Secondary | ICD-10-CM | POA: Diagnosis not present

## 2023-12-12 DIAGNOSIS — S66393A Other injury of extensor muscle, fascia and tendon of left middle finger at wrist and hand level, initial encounter: Secondary | ICD-10-CM | POA: Diagnosis not present

## 2024-01-24 DIAGNOSIS — J069 Acute upper respiratory infection, unspecified: Secondary | ICD-10-CM | POA: Diagnosis not present

## 2024-09-17 ENCOUNTER — Encounter: Admitting: Nurse Practitioner
